# Patient Record
Sex: Male | Born: 1950 | Race: White | Hispanic: No | Smoking: Never smoker
Health system: Southern US, Community
[De-identification: ages and names within clinical notes are randomized; demographics above are authoritative.]

## PROBLEM LIST (undated history)

## (undated) DIAGNOSIS — E785 Hyperlipidemia, unspecified: Secondary | ICD-10-CM

## (undated) DIAGNOSIS — I480 Paroxysmal atrial fibrillation: Secondary | ICD-10-CM

## (undated) HISTORY — DX: Paroxysmal atrial fibrillation: I48.0

## (undated) HISTORY — DX: Hyperlipidemia, unspecified: E78.5

---

## 2003-06-03 ENCOUNTER — Ambulatory Visit (HOSPITAL_COMMUNITY): Admission: RE | Admit: 2003-06-03 | Discharge: 2003-06-03 | Payer: Self-pay | Admitting: Urology

## 2003-06-07 ENCOUNTER — Ambulatory Visit (HOSPITAL_BASED_OUTPATIENT_CLINIC_OR_DEPARTMENT_OTHER): Admission: RE | Admit: 2003-06-07 | Discharge: 2003-06-07 | Payer: Self-pay | Admitting: Urology

## 2007-10-21 ENCOUNTER — Inpatient Hospital Stay (HOSPITAL_COMMUNITY): Admission: RE | Admit: 2007-10-21 | Discharge: 2007-10-23 | Payer: Self-pay | Admitting: Orthopedic Surgery

## 2009-02-17 ENCOUNTER — Emergency Department (HOSPITAL_COMMUNITY): Admission: EM | Admit: 2009-02-17 | Discharge: 2009-02-17 | Payer: Self-pay | Admitting: Emergency Medicine

## 2010-09-30 HISTORY — PX: TOTAL HIP ARTHROPLASTY: SHX124

## 2011-01-08 LAB — DIFFERENTIAL
Basophils Absolute: 0 10*3/uL (ref 0.0–0.1)
Basophils Relative: 1 % (ref 0–1)
Eosinophils Absolute: 0.1 10*3/uL (ref 0.0–0.7)
Eosinophils Relative: 1 % (ref 0–5)
Lymphocytes Relative: 33 % (ref 12–46)
Lymphs Abs: 1.3 10*3/uL (ref 0.7–4.0)
Monocytes Absolute: 0.5 10*3/uL (ref 0.1–1.0)
Monocytes Relative: 11 % (ref 3–12)
Neutro Abs: 2.2 10*3/uL (ref 1.7–7.7)
Neutrophils Relative %: 54 % (ref 43–77)

## 2011-01-08 LAB — COMPREHENSIVE METABOLIC PANEL
ALT: 20 U/L (ref 0–53)
AST: 27 U/L (ref 0–37)
Albumin: 3.7 g/dL (ref 3.5–5.2)
Alkaline Phosphatase: 70 U/L (ref 39–117)
BUN: 19 mg/dL (ref 6–23)
CO2: 27 mEq/L (ref 19–32)
Calcium: 8.8 mg/dL (ref 8.4–10.5)
Chloride: 105 mEq/L (ref 96–112)
Creatinine, Ser: 1.08 mg/dL (ref 0.4–1.5)
GFR calc non Af Amer: >60 mL/min (ref >60)
Glucose, Bld: 123 mg/dL — ABNORMAL HIGH (ref 70–99)
Potassium: 3.9 mEq/L (ref 3.5–5.1)
Sodium: 137 mEq/L (ref 135–145)
Total Bilirubin: 0.6 mg/dL (ref 0.3–1.2)
Total Protein: 6.3 g/dL (ref 6.0–8.3)

## 2011-01-08 LAB — URINALYSIS, ROUTINE W REFLEX MICROSCOPIC
Bilirubin Urine: NEGATIVE
Glucose, UA: NEGATIVE mg/dL
Hgb urine dipstick: NEGATIVE
Ketones, ur: NEGATIVE mg/dL
Nitrite: NEGATIVE
Protein, ur: NEGATIVE mg/dL
Specific Gravity, Urine: 1.03 (ref 1.005–1.030)
Urobilinogen, UA: 1 mg/dL (ref 0.0–1.0)
pH: 6 (ref 5.0–8.0)

## 2011-01-08 LAB — CBC
HCT: 44.8 % (ref 39.0–52.0)
Hemoglobin: 15.5 g/dL (ref 13.0–17.0)
MCHC: 34.5 g/dL (ref 30.0–36.0)
MCV: 88.8 fL (ref 78.0–100.0)
Platelets: 213 10*3/uL (ref 150–400)
RBC: 5.04 MIL/uL (ref 4.22–5.81)
RDW: 13.2 % (ref 11.5–15.5)
WBC: 4.1 10*3/uL (ref 4.0–10.5)

## 2011-01-08 LAB — LIPASE, BLOOD: Lipase: 23 U/L (ref 11–59)

## 2011-02-12 NOTE — Op Note (Signed)
NAMECARSEN, LEAF NO.:  [id number]   MEDICAL RECORD NO.:  [id number]          PATIENT TYPE:  INP   LOCATION:  2899                         FACILITY:  [facility]   PHYSICIAN:  Harvie Junior, M.D.   DATE OF BIRTH:  10/31/1950   DATE OF PROCEDURE:  10/21/2007  DATE OF DISCHARGE:                               OPERATIVE REPORT   PREOPERATIVE DIAGNOSIS:  Degenerative joint disease, right hip.   POSTOPERATIVE DIAGNOSIS:  Degenerative joint disease, right hip.   PROCEDURE:  Right total hip arthroplasty with S-ROM system.  A size  20.15 stem with a 20D large cone, a 36, +12 offset stem, 20 degrees of  anteversion added to the stem, a 60 mm ASR cup with a 54 mm ball with a  +0 neck option.   ASSISTANT:  Marshia Ly, P.A.   ANESTHESIA:  General.   BRIEF HISTORY:  Mr. Dittus is a 60 year old gentleman with a long  history of having had significant right hip pain.  He had been treated  conservatively for a long period time.  Because of continued complaints  of pain and failure of all conservative care, the patient was ultimately  taken to the operating room for right total hip replacement.   PROCEDURE:  The patient was taken to the operating room.  After adequate  anesthesia was obtained with general anesthetic, the patient was placed  on the operating table and moved to the left lateral decubitus.  All  bony prominences were well padded.  An axillary roll was put in place.  Attention was then turned to the right hip which was prepped and draped  in a sterile fashion after  hip positioners were placed.  An incision  was made for posterior approach to the hip.  Skin incision was carried  down to the level of the tensor fascia was divided in line with its  fibers where external rotators, piriformis and posterior capsule were  taken down as a flap in the back and the hip was then dislocated and a  provisional neck cut was made based on putting up a trial stem, engaging  the appropriate length of the neck cut.  Once this was completed,  attention was turned toward the acetabulum which was medialized and then  sequentially reamed to a level of 59.  A 60 trial was then trialed and  did hang up on the rim and a 60 final cup was put in place with 30  degrees of anteversion with about 50 degrees of lateral opening.  Once  this cup was put in place, there was a huge osteophytes posterior and  inferior as well as some anterior and inferior, but mostly posterior and  inferior.  These were debrided with the osteotome and rongeur.  Once  this was completed, attention was turned to the stem which was  sequentially reamed to a level of 15.  A 15.5 was then sent down  halfway.  Lateralizing reamers were used along the way to lateralize the  trochanter.  Once this was completed, cones were put in up to a D cone  and down to a large spout and a trial D large spout was put in place.  A  stem was put in neutral version with a little bit of a tendency  subluxate with a 36, +8 offset.  We went to a 36, +12 and that was  better, but then added 20 degrees of anteversion and had a perfectly  stable hip.   At that point, the trials were then removed.  The finals were put in  place, 18D large cone, a 20.15 stem with 20 degrees of anteversion  dialed, 36, +12 offset and then a +0 ball was trialed once the finals  were put in place.  Excellent stability again and a +0 was chosen for  the final ball.  The final ball was put in place.  The hip relocated and  put through a range of motion with great stability, symmetric leg  lengths and full range of motion.   At this point, the wound was copious and thoroughly irrigated and  suctioned dry.  The short external rotators, piriformis and posterior  capsule were repaired to the intertrochanteric line posteriorly through  drill holes and the tensor fascia was then closed with a 1 Vicryl  running, skin with 0 and 2-0 Vicryl and skin  staples.  Sterile  compressive dressing was applied.   The patient was taken to the recovery room and was noted to be in  satisfactory condition.  Estimated blood loss for the procedure was  about 650 mL. Marshia Ly assisted throughout the case and was  instrumental in its completion.      Harvie Junior, M.D.  Electronically Signed     JLG/MEDQ  D:  10/21/2007  T:  10/21/2007  Job:  [id number]

## 2011-02-15 NOTE — Discharge Summary (Signed)
[name], [name]               ACCOUNT NO.:  [id number]   MEDICAL RECORD NO.:  [id number]          PATIENT TYPE:  INP   LOCATION:  5019                         FACILITY:  [facility]   PHYSICIAN:  Patrick Nelson, M.D.   DATE OF BIRTH:  1951/08/11   DATE OF ADMISSION:  10/21/2007  DATE OF DISCHARGE:  10/23/2007                               DISCHARGE SUMMARY   ADMISSION DIAGNOSES:  1. End-stage degenerative joint disease, right hip.  2. History of renal calculi March 2005.   DISCHARGE DIAGNOSES:  1. End-stage degenerative joint disease, right hip.  2. History of renal calculi March 2005.   PROCEDURES IN HOSPITAL:  Right total hip arthroplasty, Jodi Geralds MD,  October 21, 2007.   BRIEF HISTORY:  Patrick Nelson is a pleasant 60 year old male who has a long  history of progressive right hip pain and limited motion.  X-rays of the  right hip showed end-stage DJD of the right hip.  He had pain with  ambulation and positive night pain that did not improve with exhaustive  conservative treatment including medication, modification of activities  and attempts at rest.  Based on his clinical and radiographic findings,  he was felt to be a candidate for a right total hip arthroplasty and is  admitted for this.   PERTINENT LABORATORY STUDIES:  The patient's postop x-ray of the right  hip showed a right total hip replacement without complicating features.  Hemoglobin on admission was 14.6, hematocrit 42.6.  Indices within  normal limits.  On postop day #1 hemoglobin was 12.6, #2 is 11.7.  Pro  time on admission was 13.4 seconds with an INR of 1.0 and a PTT of 30.  On the date of discharge on Coumadin therapy his pro time was 24.7  seconds with an INR of 2.2.  CMET on admission showed no abnormalities.  On postop day #1 he had a sodium of 134, a glucose of 128.  His EGFR was  normal.  Urinalysis on admission showed no abnormalities.   HOSPITAL COURSE:  The patient underwent a right total hip  arthroplasty  as well-described in Dr. Luiz Nelson' operative note on October 21, 2007.  Preoperatively he was given a gram of Ancef and 80 mg of IV gentamicin.  Postop he was given a gram of Ancef q.8 h. x3 doses.  PCA morphine pump  was used for pain control along with IV fluids and he was started on  Coumadin antithrombotic therapy per pharmacy protocol.  Physical therapy  was ordered for walker ambulation, weightbearing as tolerated on the  right side.  On postop day #1 he had appropriate hip pain.  His vital  signs were stable and he was afebrile, his right lower extremity  neurovascular status was intact.  His hemoglobin was stable, as was his  BMET, and his INR was 1.1.  He was mobilized with physical therapy,  ambulating with a walker, weightbearing as tolerated on the right.  On  postop day #2 he said he was doing fine and he was taking fluids and  voiding without difficulty.  He was progressing with  physical therapy.  His IV was discontinued.  His vital signs were stable and he was  afebrile.  His hg was 11.7, his INR was 2.2.  His right hip dressing was  clean and dry.  His IV was discontinued.  He was discharged home after  being seen by physical therapy.  He is on a regular diet, given a  prescription for Percocet 5 mg p.r.n. pain and Coumadin as directed x1  month postop DVT prophylaxis, and he was weightbearing as tolerated on  the right with a walker, and he will need home health physical therapy  three times a week and a home health RN for pro times and Coumadin  management.  He will follow up with Dr. Luiz Nelson in the office in 2 weeks.  He will use hip precautions.      Patrick Nelson, P.A.      Patrick Nelson, M.D.  Electronically Signed    JB/MEDQ  D:  12/09/2007  T:  12/10/2007  Job:  [id number]   cc:   Georgann Housekeeper, MD

## 2011-02-15 NOTE — Op Note (Signed)
NAMEMARRION, ACCOMANDO NO.:  [id number]   MEDICAL RECORD NO.:  [id number]                   PATIENT TYPE:  AMB   LOCATION:  NESC                                 FACILITY:  [facility]   PHYSICIAN:  Boston Service, M.D.             DATE OF BIRTH:  [birth year]   DATE OF PROCEDURE:  06/07/2003  DATE OF DISCHARGE:                                 OPERATIVE REPORT   PRIMARY CARE PHYSICIAN:  [facility] [employer].   UROLOGIST:  Boston Service, M.D.   PREOPERATIVE DIAGNOSIS:  Hematuria, question of longstanding stone in the  right distal ureter.   INDICATIONS:  The patient is a 60 year old male, stoic.  By his own  admission, the patient has been somewhat intermittent with followup,  presented to the emergency room in [place], [place] in 2002.  Was told he had  a stone in the right distal ureter.  Seen in our office 2003.  CT scan and  KUB consistent with persistent stone in the right distal ureter, then  presented again to our office in August 2004 with 15-50 red cells per HPF,  intermittent right lower quadrant and right CVA tenderness.  By his own  admission, the patient has been preoccupied with care for his wife who is  suffering with early onset Alzheimer's.   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   OPERATION/PROCEDURE:  1. Cystoscopy.  2. Retrograde.  3. Ureteroscopy.  4. Double-J stent placement.   ANESTHESIA:  General.   DRAINS:  A 6-French, 26 cm double-J stent.   DESCRIPTION OF PROCEDURE:  The patient was prepped and draped in the dorsal  lithotomy position after an adequate level of general anesthesia.  Well  lubricated 21-French panendoscope was gently inserted at the urethral  meatus.  Normal urethra and sphincter, nonobstructive prostate.  Small  amount of bloody urine within the bladder.  However, careful inspection of  the urothelium with the 30- and 70-degree  lenses showed no evidence of  tumor, bleeding site or other anatomic abnormality.  There were small  scattered calculi across the surface of the trigone.  Left retrograde was  performed, showed normal course and caliber of the ureter, pelvis and  calyces with prompt drainage at three to five minutes.  Right retrograde was  performed.  The patient has multiple dense phleboliths within the right  hemipelvis.  They appeared to be adjacent to the distal ureter but not  within the distal ureter.  Ureter itself showed no obvious evidence of  filling defect or obstruction.  Due to the longstanding nature of the  patient's complaint and his intermittent followup over the last 18 months,  decision made to proceed with ureteroscopy to rule out any mucosal lesion on  the right side.  Careful inspection of the ureter from the renal pelvis to  the ureterovesical junction using the  long and short 6-French ureteroscope  showed no evidence of stone or urothelial abnormality.  Ureteroscope was withdrawn.  A 6-French, 26 cm double-J stent was placed.  Bladder was drained.  Cystoscope was removed.  The patient was given 30 mg  of Toradol and a B&O suppository.  He was returned to the recovery room in  satisfactory condition.                                                  Boston Service, M.D.    RH/MEDQ  D:  06/07/2003  T:  06/07/2003  Job:  [id number]   cc:   [facility]  [address]

## 2011-06-20 LAB — URINALYSIS, ROUTINE W REFLEX MICROSCOPIC
Bilirubin Urine: NEGATIVE
Glucose, UA: NEGATIVE
Hgb urine dipstick: NEGATIVE
Ketones, ur: NEGATIVE
Nitrite: NEGATIVE
Protein, ur: NEGATIVE
Specific Gravity, Urine: 1.019
Urobilinogen, UA: 1
pH: 6

## 2011-06-20 LAB — COMPREHENSIVE METABOLIC PANEL
ALT: 26
AST: 30
Albumin: 4
Alkaline Phosphatase: 53
BUN: 15
CO2: 28
Calcium: 9.3
Chloride: 102
Creatinine, Ser: 0.93
GFR calc non Af Amer: >60
Glucose, Bld: 105 — ABNORMAL HIGH
Potassium: 4.2
Sodium: 135
Total Bilirubin: 0.7
Total Protein: 6.2

## 2011-06-20 LAB — BASIC METABOLIC PANEL
BUN: 10
CO2: 27
Calcium: 8.6
Chloride: 101
Creatinine, Ser: 0.9
GFR calc non Af Amer: >60
Glucose, Bld: 128 — ABNORMAL HIGH
Potassium: 4.3
Sodium: 134 — ABNORMAL LOW

## 2011-06-20 LAB — CBC
HCT: 33.9 — ABNORMAL LOW
HCT: 36.5 — ABNORMAL LOW
HCT: 42.6
Hemoglobin: 11.7 — ABNORMAL LOW
Hemoglobin: 12.6 — ABNORMAL LOW
Hemoglobin: 14.6
MCHC: 34.4
MCHC: 34.5
MCHC: 34.5
MCV: 89.9
MCV: 90
MCV: 90.1
Platelets: 190
Platelets: 248
Platelets: 253
RBC: 3.77 — ABNORMAL LOW
RBC: 4.05 — ABNORMAL LOW
RBC: 4.73
RDW: 13
RDW: 13.3
RDW: 13.6
WBC: 10.1
WBC: 5.5
WBC: 6.5

## 2011-06-20 LAB — DIFFERENTIAL
Basophils Absolute: 0
Basophils Relative: 1
Eosinophils Absolute: 0.1
Eosinophils Relative: 2
Lymphocytes Relative: 29
Lymphs Abs: 1.6
Monocytes Absolute: 0.5
Monocytes Relative: 9
Neutro Abs: 3.3
Neutrophils Relative %: 60

## 2011-06-20 LAB — PROTIME-INR
INR: 1
INR: 1.1
INR: 2.2 — ABNORMAL HIGH
Prothrombin Time: 13.4
Prothrombin Time: 14.2
Prothrombin Time: 24.7 — ABNORMAL HIGH

## 2011-06-20 LAB — TYPE AND SCREEN
ABO/RH(D): O POS
Antibody Screen: NEGATIVE

## 2011-06-20 LAB — APTT: aPTT: 30

## 2011-06-20 LAB — ABO/RH: ABO/RH(D): O POS

## 2014-01-25 ENCOUNTER — Ambulatory Visit (INDEPENDENT_AMBULATORY_CARE_PROVIDER_SITE_OTHER): Payer: BC Managed Care – PPO | Admitting: Cardiovascular Disease

## 2014-01-25 ENCOUNTER — Encounter: Payer: Self-pay | Admitting: Cardiovascular Disease

## 2014-01-25 VITALS — BP 120/86 | HR 70 | Ht 72.0 in | Wt 171.0 lb

## 2014-01-25 DIAGNOSIS — I4891 Unspecified atrial fibrillation: Secondary | ICD-10-CM

## 2014-01-25 DIAGNOSIS — I480 Paroxysmal atrial fibrillation: Secondary | ICD-10-CM | POA: Insufficient documentation

## 2014-01-25 DIAGNOSIS — E785 Hyperlipidemia, unspecified: Secondary | ICD-10-CM

## 2014-01-25 NOTE — Assessment & Plan Note (Signed)
Was referred because of a singular episode of PAF in the setting of an acute viral illness. He has no cardiac risk factors. He does not have palpitations. He exercises 5 days a week and appears to be in excellent shape at this point I do not see a point in pursuing workup since his CHADS2 to score is low. I will see her back on a when necessary basis.

## 2014-01-25 NOTE — Patient Instructions (Signed)
Your physician recommends that you schedule a follow-up appointment in: As Needed    

## 2014-01-25 NOTE — Assessment & Plan Note (Signed)
On statin therapy followed by his PCP 

## 2014-01-25 NOTE — Progress Notes (Signed)
     01/25/2014 Patrick Nelson   11-04-1950  [id number]  Primary Physician Wenda Low, MD Primary Cardiologist: Lorretta Harp MD Renae Gloss   HPI:  Patrick Nelson is a very pleasant 63 year old widowed Caucasian male father of one child, a grandfather with 3 grandchildren referred by Dr. Wenda Low  at [place] for evaluation of one episode of paroxysmal atrial fibrillation.Patrick Nelson has no cardiovascular risk factors other than hyperlipidemia on statin therapy. He has never had a heart attack or stroke. There is no family history. He denies chest pain or shortness of breath. He had a viral illness possible month ago and was seen in urgent care at which time he was noted to have an irregular heart rhythm and EKG apparently showed atrial fibrillation. This actually spontaneously converted to sinus rhythm and has not recurred.   Current Outpatient Prescriptions  Medication Sig Dispense Refill  . atorvastatin (LIPITOR) 10 MG tablet Take 10 mg by mouth daily.       No current facility-administered medications for this visit.    No Known Allergies  History   Social History  . Marital Status: Widowed    Spouse Name: N/A    Number of Children: N/A  . Years of Education: N/A   Occupational History  . Not on file.   Social History Main Topics  . Smoking status: Never Smoker   . Smokeless tobacco: Not on file  . Alcohol Use: Not on file  . Drug Use: Not on file  . Sexual Activity: Not on file   Other Topics Concern  . Not on file   Social History Narrative  . No narrative on file     Review of Systems: General: negative for chills, fever, night sweats or weight changes.  Cardiovascular: negative for chest pain, dyspnea on exertion, edema, orthopnea, palpitations, paroxysmal nocturnal dyspnea or shortness of breath Dermatological: negative for rash Respiratory: negative for cough or wheezing Urologic: negative for hematuria Abdominal: negative for  nausea, vomiting, diarrhea, bright red blood per rectum, melena, or hematemesis Neurologic: negative for visual changes, syncope, or dizziness All other systems reviewed and are otherwise negative except as noted above.    Blood pressure 120/86, pulse 70, height 6' (1.829 m), weight 171 lb (77.565 kg).  General appearance: alert and no distress Neck: no adenopathy, no carotid bruit, no JVD, supple, symmetrical, trachea midline and thyroid not enlarged, symmetric, no tenderness/mass/nodules Lungs: clear to auscultation bilaterally Heart: regular rate and rhythm, S1, S2 normal, no murmur, click, rub or gallop Abdomen: soft, non-tender; bowel sounds normal; no masses,  no organomegaly Extremities: extremities normal, atraumatic, no cyanosis or edema and plus pedal pulses  EKG normal sinus rhythm at 70 without ST or T wave changes  ASSESSMENT AND PLAN:   Hyperlipidemia On statin therapy followed by his PCP  Paroxysmal atrial fibrillation Was referred because of a singular episode of PAF in the setting of an acute viral illness. He has no cardiac risk factors. He does not have palpitations. He exercises 5 days a week and appears to be in excellent shape at this point I do not see a point in pursuing workup since his CHADS2 to score is low. I will see her back on a when necessary basis.      Lorretta Harp MD FACP,FACC,FAHA, [facility] 01/25/2014 2:51 PM

## 2014-01-28 ENCOUNTER — Encounter: Payer: Self-pay | Admitting: *Deleted

## 2014-05-17 DIAGNOSIS — I781 Nevus, non-neoplastic: Secondary | ICD-10-CM | POA: Diagnosis not present

## 2014-05-17 DIAGNOSIS — L821 Other seborrheic keratosis: Secondary | ICD-10-CM | POA: Diagnosis not present

## 2014-05-17 DIAGNOSIS — L819 Disorder of pigmentation, unspecified: Secondary | ICD-10-CM | POA: Diagnosis not present

## 2014-05-17 DIAGNOSIS — Z85828 Personal history of other malignant neoplasm of skin: Secondary | ICD-10-CM | POA: Diagnosis not present

## 2014-05-17 DIAGNOSIS — D239 Other benign neoplasm of skin, unspecified: Secondary | ICD-10-CM | POA: Diagnosis not present

## 2014-12-14 ENCOUNTER — Encounter (HOSPITAL_COMMUNITY): Payer: Self-pay | Admitting: Nurse Practitioner

## 2014-12-14 ENCOUNTER — Emergency Department (HOSPITAL_COMMUNITY): Payer: Medicare Other

## 2014-12-14 ENCOUNTER — Inpatient Hospital Stay (HOSPITAL_COMMUNITY)
Admission: EM | Admit: 2014-12-14 | Discharge: 2014-12-16 | DRG: 194 | Disposition: A | Discharge status: Home / Self Care | Payer: Medicare Other | Attending: Internal Medicine | Admitting: Internal Medicine

## 2014-12-14 DIAGNOSIS — J189 Pneumonia, unspecified organism: Secondary | ICD-10-CM | POA: Diagnosis present

## 2014-12-14 DIAGNOSIS — E785 Hyperlipidemia, unspecified: Secondary | ICD-10-CM | POA: Diagnosis present

## 2014-12-14 DIAGNOSIS — I10 Essential (primary) hypertension: Secondary | ICD-10-CM | POA: Diagnosis present

## 2014-12-14 DIAGNOSIS — I480 Paroxysmal atrial fibrillation: Secondary | ICD-10-CM | POA: Diagnosis present

## 2014-12-14 DIAGNOSIS — Z96641 Presence of right artificial hip joint: Secondary | ICD-10-CM | POA: Diagnosis present

## 2014-12-14 DIAGNOSIS — I959 Hypotension, unspecified: Secondary | ICD-10-CM | POA: Diagnosis present

## 2014-12-14 DIAGNOSIS — Z79899 Other long term (current) drug therapy: Secondary | ICD-10-CM

## 2014-12-14 DIAGNOSIS — J159 Unspecified bacterial pneumonia: Principal | ICD-10-CM | POA: Diagnosis present

## 2014-12-14 DIAGNOSIS — R55 Syncope and collapse: Secondary | ICD-10-CM | POA: Diagnosis present

## 2014-12-14 DIAGNOSIS — J918 Pleural effusion in other conditions classified elsewhere: Secondary | ICD-10-CM

## 2014-12-14 DIAGNOSIS — Z23 Encounter for immunization: Secondary | ICD-10-CM | POA: Diagnosis not present

## 2014-12-14 DIAGNOSIS — I4891 Unspecified atrial fibrillation: Secondary | ICD-10-CM | POA: Diagnosis not present

## 2014-12-14 DIAGNOSIS — J948 Other specified pleural conditions: Secondary | ICD-10-CM | POA: Diagnosis not present

## 2014-12-14 DIAGNOSIS — J90 Pleural effusion, not elsewhere classified: Secondary | ICD-10-CM | POA: Diagnosis present

## 2014-12-14 DIAGNOSIS — J181 Lobar pneumonia, unspecified organism: Secondary | ICD-10-CM

## 2014-12-14 DIAGNOSIS — R079 Chest pain, unspecified: Secondary | ICD-10-CM | POA: Diagnosis not present

## 2014-12-14 LAB — TROPONIN I: Troponin I: <0.03 ng/mL (ref <0.031)

## 2014-12-14 LAB — CBC WITH DIFFERENTIAL/PLATELET
Basophils Absolute: 0 10*3/uL (ref 0.0–0.1)
Basophils Relative: 0 % (ref 0–1)
Eosinophils Absolute: 0 10*3/uL (ref 0.0–0.7)
Eosinophils Relative: 0 % (ref 0–5)
HCT: 46.8 % (ref 39.0–52.0)
Hemoglobin: 16 g/dL (ref 13.0–17.0)
Lymphocytes Relative: 10 % — ABNORMAL LOW (ref 12–46)
Lymphs Abs: 0.7 10*3/uL (ref 0.7–4.0)
MCH: 30.6 pg (ref 26.0–34.0)
MCHC: 34.2 g/dL (ref 30.0–36.0)
MCV: 89.5 fL (ref 78.0–100.0)
Monocytes Absolute: 0.7 10*3/uL (ref 0.1–1.0)
Monocytes Relative: 11 % (ref 3–12)
Neutro Abs: 5.4 10*3/uL (ref 1.7–7.7)
Neutrophils Relative %: 79 % — ABNORMAL HIGH (ref 43–77)
Platelets: 128 10*3/uL — ABNORMAL LOW (ref 150–400)
RBC: 5.23 MIL/uL (ref 4.22–5.81)
RDW: 13 % (ref 11.5–15.5)
WBC: 6.9 10*3/uL (ref 4.0–10.5)

## 2014-12-14 LAB — URINALYSIS, ROUTINE W REFLEX MICROSCOPIC
Bilirubin Urine: NEGATIVE
Glucose, UA: NEGATIVE mg/dL
Hgb urine dipstick: NEGATIVE
Ketones, ur: 40 mg/dL — AB
Leukocytes, UA: NEGATIVE
Nitrite: NEGATIVE
Protein, ur: NEGATIVE mg/dL
Specific Gravity, Urine: 1.021 (ref 1.005–1.030)
Urobilinogen, UA: 0.2 mg/dL (ref 0.0–1.0)
pH: 6 (ref 5.0–8.0)

## 2014-12-14 LAB — COMPREHENSIVE METABOLIC PANEL
ALT: 48 U/L (ref 0–53)
AST: 63 U/L — ABNORMAL HIGH (ref 0–37)
Albumin: 3.1 g/dL — ABNORMAL LOW (ref 3.5–5.2)
Alkaline Phosphatase: 51 U/L (ref 39–117)
Anion gap: 9 (ref 5–15)
BUN: 15 mg/dL (ref 6–23)
CO2: 26 mmol/L (ref 19–32)
Calcium: 8.1 mg/dL — ABNORMAL LOW (ref 8.4–10.5)
Chloride: 100 mmol/L (ref 96–112)
Creatinine, Ser: 0.97 mg/dL (ref 0.50–1.35)
GFR calc Af Amer: >90 mL/min (ref >90)
GFR calc non Af Amer: 85 mL/min — ABNORMAL LOW (ref >90)
Glucose, Bld: 110 mg/dL — ABNORMAL HIGH (ref 70–99)
Potassium: 4 mmol/L (ref 3.5–5.1)
Sodium: 135 mmol/L (ref 135–145)
Total Bilirubin: 0.8 mg/dL (ref 0.3–1.2)
Total Protein: 5.9 g/dL — ABNORMAL LOW (ref 6.0–8.3)

## 2014-12-14 LAB — MAGNESIUM: Magnesium: 1.8 mg/dL (ref 1.5–2.5)

## 2014-12-14 MED ORDER — DEXTROSE 5 % IV SOLN
1.0000 g | INTRAVENOUS | Status: DC
  Administered 2014-12-15 – 2014-12-16 (×2): 1 g via INTRAVENOUS
  Filled 2014-12-14 (×3): qty 10

## 2014-12-14 MED ORDER — HEPARIN BOLUS VIA INFUSION
4000.0000 [IU] | Freq: Once | INTRAVENOUS | Status: AC
  Administered 2014-12-14: 4000 [IU] via INTRAVENOUS
  Filled 2014-12-14: qty 4000

## 2014-12-14 MED ORDER — AZITHROMYCIN 250 MG PO TABS
500.0000 mg | ORAL_TABLET | ORAL | Status: DC
  Administered 2014-12-15: 500 mg via ORAL
  Filled 2014-12-14 (×2): qty 2

## 2014-12-14 MED ORDER — DEXTROSE 5 % IV SOLN
500.0000 mg | Freq: Once | INTRAVENOUS | Status: AC
  Administered 2014-12-14: 500 mg via INTRAVENOUS
  Filled 2014-12-14: qty 500

## 2014-12-14 MED ORDER — CEFTRIAXONE SODIUM 1 G IJ SOLR
1.0000 g | Freq: Once | INTRAMUSCULAR | Status: AC
  Administered 2014-12-14: 1 g via INTRAVENOUS
  Filled 2014-12-14: qty 10

## 2014-12-14 MED ORDER — INFLUENZA VAC SPLIT QUAD 0.5 ML IM SUSY
0.5000 mL | PREFILLED_SYRINGE | INTRAMUSCULAR | Status: AC
  Administered 2014-12-16: 0.5 mL via INTRAMUSCULAR
  Filled 2014-12-14: qty 0.5

## 2014-12-14 MED ORDER — SODIUM CHLORIDE 0.9 % IV BOLUS (SEPSIS)
1000.0000 mL | Freq: Once | INTRAVENOUS | Status: AC
Start: 2014-12-14 — End: 2014-12-14
  Administered 2014-12-14: 1000 mL via INTRAVENOUS

## 2014-12-14 MED ORDER — DILTIAZEM HCL 30 MG PO TABS
30.0000 mg | ORAL_TABLET | Freq: Four times a day (QID) | ORAL | Status: DC
  Administered 2014-12-14 – 2014-12-16 (×8): 30 mg via ORAL
  Filled 2014-12-14 (×10): qty 1

## 2014-12-14 MED ORDER — HEPARIN (PORCINE) IN NACL 100-0.45 UNIT/ML-% IJ SOLN
1150.0000 [IU]/h | INTRAMUSCULAR | Status: DC
  Administered 2014-12-14: 1150 [IU]/h via INTRAVENOUS
  Filled 2014-12-14: qty 250

## 2014-12-14 NOTE — ED Notes (Signed)
Cards at bedside

## 2014-12-14 NOTE — ED Provider Notes (Signed)
  Physical Exam  BP 104/68 mmHg  Pulse 82  Temp(Src) 97.9 F (36.6 C) (Oral)  Resp 21  Ht 6' (1.829 m)  Wt 175 lb (79.379 kg)  BMI 23.73 kg/m2  SpO2 97%  Physical Exam  ED Course  Procedures  MDM Assumed care of pt from Dr. Vanita Panda.  Pt had syncope, has afib, no recent change.  XR with PNA.  Initially hypotensive, this quickly resolved with fluids.  On assumption of care awaiting labs.    Labs returned.  Admitted in stable condition. Syncope - Plan: DG Chest 2 View, DG Chest 2 View       Debby Freiberg, [place] 12/15/14 [phone]

## 2014-12-14 NOTE — ED Notes (Signed)
Per EMS pt from urgent care to be evaluated for malaise over the past 3 days. Patient denies pain n/v/d. Patient had syncopal episode at urgent care in waiting room. Denies injury. Pressure sitting with ems was initially 90/64- upon ems arrival pt pale. Placed in trendelenburg and given 500cc of NS. Patient alert and oriented x4. NAD

## 2014-12-14 NOTE — ED Notes (Signed)
Attempted to call report X 1.  Cathleen Corti to call back in a few minutes.

## 2014-12-14 NOTE — Progress Notes (Signed)
ANTICOAGULATION CONSULT NOTE - Initial Consult  Pharmacy Consult for Heparin Indication: atrial fibrillation  No Known Allergies  Patient Measurements: Height: 6' (182.9 cm) Weight: 175 lb (79.379 kg) IBW/kg (Calculated) : 77.6 Heparin Dosing Weight: 79 kg  Vital Signs: Temp: 97.9 F (36.6 C) (03/16 1348) Temp Source: Oral (03/16 1348) BP: 110/89 mmHg (03/16 1615) Pulse Rate: 87 (03/16 1615)  Labs:  Recent Labs  12/14/14 1543  HGB 16.0  HCT 46.8  PLT 128*    CrCl cannot be calculated (Patient has no serum creatinine result on file.).   Medical History: Past Medical History  Diagnosis Date  . Paroxysmal atrial fibrillation   . Hyperlipidemia     Medications:   (Not in a hospital admission) Scheduled:  . diltiazem  30 mg Oral 4 times per day   Infusions:  . azithromycin    . cefTRIAXone (ROCEPHIN)  IV 1 g (12/14/14 1615)    Assessment: 64yo male with history of HLD and Afib presents with loss of consciousness. Pharmacy is consulted to dose heparin for atrial fibrillation. CBC is wnl, sCr 0.97, Trop neg x1.  Goal of Therapy:  Heparin level 0.3-0.7 units/ml Monitor platelets by anticoagulation protocol: Yes   Plan:  Give 4000 units bolus x 1 Start heparin infusion at 1150 units/hr Check anti-Xa level in 6 hours and daily while on heparin Continue to monitor H&H and platelets  Andrey Cota. Diona Foley, PharmD Clinical Pharmacist Pager [phone] 12/14/2014,4:24 PM

## 2014-12-14 NOTE — Consult Note (Addendum)
Patient ID: Patrick Nelson MRN: [id number], DOB/AGE: 66-22-1952   Admit date: 12/14/2014   Primary Physician: Wenda Low, MD Primary Cardiologist: Dr. Gwenlyn Found  Pt. Profile:  64 y/o male with history of PAF, not on oral anticoagulation given low CHA2DS2 VASc score, presenting to the ED with atrial fibrillation and syncope.    Problem List  Past Medical History  Diagnosis Date  . Paroxysmal atrial fibrillation   . Hyperlipidemia     Past Surgical History  Procedure Laterality Date  . Total hip arthroplasty  2012    right hip     Allergies  No Known Allergies  HPI The patient is a 64 y/o male with a h/o treated HLD and PAF. He was first diagnosed with atrial fibrillation 12/2013 which occurred during a viral illness, at which time he was seen at an urgent care and EKG showed the arrhthymia. He apparently had spontaneous conversion to NSR. He was referred to Dr. Gwenlyn Found for further evaluation. He was seen in clinic 01/25/14. EKG at that time showed NSR. Per office note, since he had only a single episode in the setting of acute viral illness, low CHA2DS2 VASc score and no cardiac risk factors except for HLD, no further work-up was indicated. Dr. Gwenlyn Found recommended f/u on a PRN basis. He is followed medically by Dr. Lysle Rubens.   He presented to an urgent care facility today with complaints of malaise over the past 3 days. He felt like he had the flu: myalgias, fatigue, weakness, subjective fever and chills, mild productive cough with yellow sputum. No n/v/d. He denies use of Sudafed.  He reports poor PO intake over the last 3 days.   While in the waiting area at the urgent care, he had syncope. His fiance was present at the time and note that he was unresponsive for about 2 min with a blank stare. He was very pale. No seizure like activity. BP was 90/64. He was given a bolus of NS. EKG demonstrated atrial fibrillation. He was subsequently transferred to the ED for further care.   While in  the ED he has had paroxysmal atrial fibrillation, at times with brief RVR. He as been observed to have ventricular rates increase into the 150s, but then quickly return in the 70s-80s. He denies any palpitations, chest discomfort or dyspnea. CXR shows RLL PNA. CBC and BMP pending.    Home Medications  Prior to Admission medications   Medication Sig Start Date End Date Taking? Authorizing Provider  atorvastatin (LIPITOR) 10 MG tablet Take 10 mg by mouth daily.    Historical Provider, MD    Family History  Family History  Problem Relation Age of Onset  . Hypertension Mother   . Heart disease Brother     Social History  History   Social History  . Marital Status: Widowed    Spouse Name: N/A  . Number of Children: N/A  . Years of Education: N/A   Occupational History  . Not on file.   Social History Main Topics  . Smoking status: Never Smoker   . Smokeless tobacco: Not on file  . Alcohol Use: No  . Drug Use: No  . Sexual Activity: Not on file   Other Topics Concern  . Not on file   Social History Narrative     Review of Systems General:  No chills, fever, night sweats or weight changes.  Cardiovascular:  No chest pain, dyspnea on exertion, edema, orthopnea, palpitations, paroxysmal nocturnal dyspnea. Dermatological: No rash,  lesions/masses Respiratory: No cough, dyspnea Urologic: No hematuria, dysuria Abdominal:   No nausea, vomiting, diarrhea, bright red blood per rectum, melena, or hematemesis Neurologic:  No visual changes, wkns, changes in mental status. All other systems reviewed and are otherwise negative except as noted above.  Physical Exam  Blood pressure 110/79, pulse 89, temperature 97.9 F (36.6 C), temperature source Oral, resp. rate 18, height 6' (1.829 m), weight 175 lb (79.379 kg), SpO2 95 %.  General: Pleasant, NAD Psych: Normal affect. Neuro: Alert and oriented X 3. Moves all extremities spontaneously. HEENT: Normal  Neck: Supple without  bruits or JVD. Lungs:  Resp regular and unlabored, rhonchi over RLF Heart: RRR no s3, s4, or murmurs. Abdomen: Soft, non-tender, non-distended, BS + x 4.  Extremities: No clubbing, cyanosis or edema. DP/PT/Radials 2+ and equal bilaterally.  Labs  Troponin (Point of Care Test) No results for input(s): TROPIPOC in the last 72 hours. No results for input(s): CKTOTAL, CKMB, TROPONINI in the last 72 hours. Lab Results  Component Value Date   WBC 4.1 02/17/2009   HGB 15.5 02/17/2009   HCT 44.8 02/17/2009   MCV 88.8 02/17/2009   PLT 213 02/17/2009   No results for input(s): NA, K, CL, CO2, BUN, CREATININE, CALCIUM, PROT, BILITOT, ALKPHOS, ALT, AST, GLUCOSE in the last 168 hours.  Invalid input(s): LABALBU No results found for: CHOL, HDL, LDLCALC, TRIG No results found for: DDIMER   Radiology/Studies  CXR 12/14/14 IMPRESSION: Pneumonia of the right lung base with associated parapneumonic effusion. Follow-up chest imaging may be useful to assure resolution.  There is increased prominence of the left border of the aorta compared to the prior plain films. Tortuosity can be seen the setting of longstanding hypertension, or alternatively, this could represent development of a thoracoabdominal aneurysm. If the patient has no current symptoms attributable to acute aortic syndrome, referral for nonemergent chest CT angiogram may be considered.  Atherosclerosis.  Signed,  Dulcy Fanny. Earleen Newport, DO  [facility]  [facility] ECG/Telemetry  PAF. No ischemic abnormalities on EKG.   ASSESSMENT AND PLAN  1. PAF: Patient appears to be going in and out of atrial fibrillation on telemetry. Ventricular rate is currently stable but occasional spikes in the 130s-150s. Currently asymptomatic. BP is soft but stable in the 956O systolic. CXR shows RLL PNA. Labs pending. Admit to telemetry. Treat underlying acute illness (PNA). Check electrolytes and  repleat if needed. Check TSH. Order 2D echo. Cycle cardiac enzymes. Start IV heparin. He should not require long term anticoagulation given his low CHA2DS2 VASc score of 1 (Age 87-74). If afib continues despite treatment of PNA, he may need ischemic eval. NST vs LHC depending on echo and enzyme trend. Place on Cardizem 30 mg Q6H for rate control, hold for SBP <100.   2. Syncope: in the setting of atrial fibrillation and borderline hypotension. Labs pending. Will check a 2D echo. Check orthostatics.   3. PNA: CXR shows RLL PNA. Recommend admission/ management per Internal Medicine.   4. HLD: continue home statin dose, 10 mg of Lipitor.   5. Hypotension: another liter of IVFs given in ED. Monitor.   Signed, Lyda Jester, PA-C 12/14/2014, 2:24 PM  Patient seen with PA, agree with the above note.  Generally healthy 64 yo with history of PAF presented with probable PNA and was found to have atrial fibrillation.  He had a prior episode of atrial fibrillation also in the setting of a viral illness.  1. Atrial fibrillation: Paroxysmal.  He is in and out of atrial fibrillation today.  Mild RVR.  I suspect this was triggered by acute illness, suspect PNA based on CXR and symptoms.  CHADSVASC = 1 for age.  Last known episode about a year ago.  - Will treat with heparin gtt for now, will not need long-term anticoagulation unless atrial fibrillation becomes persistent and we need to cardiovert.  - Add diltiazem 30 mg every 6 hrs for now, hold if SBP < 100.  As above, he will be getting IV fluid.  - Needs echocardiogram.  2. PNA: Suspected PNA by symptoms and by CXR.  Cannot rule out influenza.   - Treat with abx - Would test for flu.  3. 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   To be admitted by hospitalist, we will follow.  Loralie Champagne 12/14/2014 3:57 PM

## 2014-12-14 NOTE — H&P (Signed)
[facility] History and Physical  Raza Bayless ZJI:[id number] DOB: [birth year] DOA: 12/14/2014  Referring physician: EDP PCP: Wenda Low, MD   Chief Complaint: Syncope   HPI: Patrick Nelson is a 64 y.o. male with h/o A.fib, presents to the ED today with cough and fever.  He had a syncopal episode in PCPs office waiting room, uninjured.  Cough and fever onset yesterday and got worse today.  Had generalized weakness starting 3 days ago.  BP per EMS initially 90/64, rose to 175Z systolic after just 025 cc bolus.  Review of Systems: no: sore throat, sinusitis, muscle or body aches, chest pain, abdominal pain, N/V/D, Systems reviewed.  As above, otherwise negative  Past Medical History  Diagnosis Date  . Paroxysmal atrial fibrillation   . Hyperlipidemia    Past Surgical History  Procedure Laterality Date  . Total hip arthroplasty  2012    right hip   Social History:  reports that he has never smoked. He does not have any smokeless tobacco history on file. He reports that he does not drink alcohol or use illicit drugs.  No Known Allergies  Family History  Problem Relation Age of Onset  . Hypertension Mother   . Heart disease Brother      Prior to Admission medications   Medication Sig Start Date End Date Taking? Authorizing Provider  atorvastatin (LIPITOR) 10 MG tablet Take 10 mg by mouth daily.   Yes Historical Provider, MD  naproxen sodium (ANAPROX) 220 MG tablet Take 220 mg by mouth every 6 (six) hours as needed (pain).   Yes Historical Provider, MD  Phenyleph-Doxylamine-DM-APAP 5-6.25-10-325 MG CAPS Take 1 tablet by mouth at bedtime as needed (cold, congestion).   Yes Historical Provider, MD   Physical Exam: Filed Vitals:   12/14/14 1806  BP: 117/74  Pulse: 53  Temp:   Resp: 19    BP 117/74 mmHg  Pulse 53  Temp(Src) 97.9 F (36.6 C) (Oral)  Resp 19  Ht 6' (1.829 m)  Wt 79.379 kg (175 lb)  BMI 23.73 kg/m2  SpO2 97%  General Appearance:    Alert,  oriented, no distress, appears stated age  Head:    Normocephalic, atraumatic  Eyes:    PERRL, EOMI, sclera non-icteric        Nose:   Nares without drainage or epistaxis. Mucosa, turbinates normal  Throat:   Moist mucous membranes. Oropharynx without erythema or exudate.  Neck:   Supple. No carotid bruits.  No thyromegaly.  No lymphadenopathy.   Back:     No CVA tenderness, no spinal tenderness  Lungs:     Clear to auscultation bilaterally, without wheezes, rhonchi or rales  Chest wall:    No tenderness to palpitation  Heart:    Regular rate and rhythm without murmurs, gallops, rubs  Abdomen:     Soft, non-tender, nondistended, normal bowel sounds, no organomegaly  Genitalia:    deferred  Rectal:    deferred  Extremities:   No clubbing, cyanosis or edema.  Pulses:   2+ and symmetric all extremities  Skin:   Skin color, texture, turgor normal, no rashes or lesions  Lymph nodes:   Cervical, supraclavicular, and axillary nodes normal  Neurologic:   CNII-XII intact. Normal strength, sensation and reflexes      throughout    Labs on Admission:  Basic Metabolic Panel:  Recent Labs Lab 12/14/14 1543  NA 135  K 4.0  CL 100  CO2 26  GLUCOSE 110*  BUN 15  CREATININE  0.97  CALCIUM 8.1*  MG 1.8   Liver Function Tests:  Recent Labs Lab 12/14/14 1543  AST 63*  ALT 48  ALKPHOS 51  BILITOT 0.8  PROT 5.9*  ALBUMIN 3.1*   No results for input(s): LIPASE, AMYLASE in the last 168 hours. No results for input(s): AMMONIA in the last 168 hours. CBC:  Recent Labs Lab 12/14/14 1543  WBC 6.9  NEUTROABS 5.4  HGB 16.0  HCT 46.8  MCV 89.5  PLT 128*   Cardiac Enzymes:  Recent Labs Lab 12/14/14 1543  TROPONINI <0.03    BNP (last 3 results) No results for input(s): PROBNP in the last 8760 hours. CBG: No results for input(s): GLUCAP in the last 168 hours.  Radiological Exams on Admission: Dg Chest 2 View  12/14/2014   CLINICAL DATA:  64 year old male with a history of  syncope.  EXAM: CHEST - 2 VIEW  COMPARISON:  02/17/2009, 10/15/2007  FINDINGS: Cardiac diameter within normal limits. Mild tortuosity descending thoracic aorta. Increased prominence of the left aortic border on the current study.  Atherosclerotic calcifications of the aortic arch.  Airspace opacity of the right base posteriorly on the lateral view. Small pleural effusion. No pneumothorax.  No displaced fracture.  Unremarkable appearance of the upper abdomen.  IMPRESSION: Pneumonia of the right lung base with associated parapneumonic effusion. Follow-up chest imaging may be useful to assure resolution.  There is increased prominence of the left border of the aorta compared to the prior plain films. Tortuosity can be seen the setting of longstanding hypertension, or alternatively, this could represent development of a thoracoabdominal aneurysm. If the patient has no current symptoms attributable to acute aortic syndrome, referral for nonemergent chest CT angiogram may be considered.  Atherosclerosis.  Signed,  Dulcy Fanny. Earleen Newport, DO  [facility]  [facility]   Electronically Signed   By: Corrie Mckusick D.O.   On: 12/14/2014 15:26    EKG: Independently reviewed.  Assessment/Plan Principal Problem:   CAP (community acquired pneumonia) Active Problems:   Syncope   Right lower lobe pneumonia   Parapneumonic effusion   1. CAP - RLL PNA with associated parapneumonic effusion, likely representing a bacterial pneumonia. 1. PNA pathway 2. Rocephin and azithromycin 3. Cultures pending 4. Influenza panel pending but dosent really have ILI symptoms 2. A.Fib with intermittent RVR - 1. Rate controlled with cardizem 2. Continue cardizem per cards 3. On heparin gtt for now, but CHADS-VASc score is only 1 4. Cards on board 3. Syncope - likely related to hypotension and acute CAP illness   Code Status: Full Code  Family Communication: Wife at bedside Disposition  Plan: Admit to inpatient   Time spent: 30 min  Mauriah Mcmillen M. [facility] Pager [phone]  If 7AM-7PM, please contact the day team taking care of the patient [email or web address] Password [facility] 12/14/2014, 8:05 PM

## 2014-12-14 NOTE — ED Notes (Signed)
Patient transported to X-ray 

## 2014-12-14 NOTE — ED Provider Notes (Signed)
CSN: [id number]     Arrival date & time    History   First MD Initiated Contact with Patient 12/14/14 1339     Chief Complaint  Patient presents with  . Loss of Consciousness    HPI  Patient presents after an episode of syncope. Patient states that without clear precipitant about 3 days ago he began to feel generally weak. No palpitations, no nausea, no confusion. Today, with persistent weakness he went to his physician's office. Patient had increasing lightheadedness, had an episode of syncope, without trauma. EMS reports that the patient's blood pressure was initially 90/64. Patient improved with fluids. Here the patient states that he feels weak, but denies focal pain, confusion, disorientation, headache. He states that he was well prior to the onset of weakness several days ago. No new medication, diet, travel. Patient states that he has one prior episode of atrial fibrillation, but takes no medication for arrhythmia. Patient takes only statin.  Past Medical History  Diagnosis Date  . Paroxysmal atrial fibrillation   . Hyperlipidemia    Past Surgical History  Procedure Laterality Date  . Total hip arthroplasty  2012    right hip   Family History  Problem Relation Age of Onset  . Hypertension Mother   . Heart disease Brother    History  Substance Use Topics  . Smoking status: Never Smoker   . Smokeless tobacco: Not on file  . Alcohol Use: No    Review of Systems  Constitutional:       Per HPI, otherwise negative  HENT:       Per HPI, otherwise negative  Respiratory:       Per HPI, otherwise negative  Cardiovascular:       Per HPI, otherwise negative  Gastrointestinal: Negative for vomiting.  Endocrine:       Negative aside from HPI  Genitourinary:       Neg aside from HPI   Musculoskeletal:       Per HPI, otherwise negative  Skin: Negative.   Neurological: Positive for weakness. Negative for syncope.      Allergies  Review of patient's allergies  indicates no known allergies.  Home Medications   Prior to Admission medications   Medication Sig Start Date End Date Taking? Authorizing Provider  atorvastatin (LIPITOR) 10 MG tablet Take 10 mg by mouth daily.    Historical Provider, MD   BP 110/79 mmHg  Pulse 89  Temp(Src) 97.9 F (36.6 C) (Oral)  Resp 18  Ht 6' (1.829 m)  Wt 175 lb (79.379 kg)  BMI 23.73 kg/m2  SpO2 95% Physical Exam  Constitutional: He is oriented to person, place, and time. He appears well-developed. No distress.  HENT:  Head: Normocephalic and atraumatic.  Eyes: Conjunctivae and EOM are normal.  Cardiovascular: An irregularly irregular rhythm present.  Pulmonary/Chest: Effort normal. No stridor. No respiratory distress.  Abdominal: He exhibits no distension.  Musculoskeletal: He exhibits no edema.  Neurological: He is alert and oriented to person, place, and time. No cranial nerve deficit. He exhibits normal muscle tone. Coordination normal.  Skin: Skin is warm and dry.  Psychiatric: He has a normal mood and affect.  Nursing note and vitals reviewed.   ED Course  Procedures (including critical care time) Labs Review Labs Reviewed  CBC WITH DIFFERENTIAL/PLATELET - Abnormal; Notable for the following:    Platelets 128 (*)    Neutrophils Relative % 79 (*)    Lymphocytes Relative 10 (*)    All  other components within normal limits  COMPREHENSIVE METABOLIC PANEL - Abnormal; Notable for the following:    Glucose, Bld 110 (*)    Calcium 8.1 (*)    Total Protein 5.9 (*)    Albumin 3.1 (*)    AST 63 (*)    GFR calc non Af Amer 85 (*)    All other components within normal limits  TROPONIN I  MAGNESIUM  URINALYSIS, ROUTINE W REFLEX MICROSCOPIC  HEPARIN LEVEL (UNFRACTIONATED)  HEPARIN LEVEL (UNFRACTIONATED)  CBC    Imaging Review Dg Chest 2 View  12/14/2014   CLINICAL DATA:  64 year old male with a history of syncope.  EXAM: CHEST - 2 VIEW  COMPARISON:  02/17/2009, 10/15/2007  FINDINGS: Cardiac  diameter within normal limits. Mild tortuosity descending thoracic aorta. Increased prominence of the left aortic border on the current study.  Atherosclerotic calcifications of the aortic arch.  Airspace opacity of the right base posteriorly on the lateral view. Small pleural effusion. No pneumothorax.  No displaced fracture.  Unremarkable appearance of the upper abdomen.  IMPRESSION: Pneumonia of the right lung base with associated parapneumonic effusion. Follow-up chest imaging may be useful to assure resolution.  There is increased prominence of the left border of the aorta compared to the prior plain films. Tortuosity can be seen the setting of longstanding hypertension, or alternatively, this could represent development of a thoracoabdominal aneurysm. If the patient has no current symptoms attributable to acute aortic syndrome, referral for nonemergent chest CT angiogram may be considered.  Atherosclerosis.  Signed,  Dulcy Fanny. Earleen Newport, DO  [facility]  [facility]   Electronically Signed   By: Corrie Mckusick D.O.   On: 12/14/2014 15:26     EKG Interpretation   Date/Time:  Wednesday December 14 2014 14:44:25 EDT Ventricular Rate:  97 PR Interval:    QRS Duration: 69 QT Interval:  348 QTC Calculation: 442 R Axis:   11 Text Interpretation:  Atrial fibrillation RSR' in V1 or V2, probably  normal variant Atrial fibrillation T wave abnormality Abnormal ekg  Confirmed by Carmin Muskrat  MD [phone]) on 12/14/2014 2:46:31 PM     I discussed patient's case with EMS providers upon his arrival. EMS rhythm strip shows atrial fibrillation, rate 85, abnormal  Monitor rate 90s, A. fib, abnormal Pulse oxygen 100% room air normal   3:56 PM Patient in no distress.  I discussed patient's case with our cardiology team. Patient be started on heparin pending possible intervention for his atrial fibrillation.  Patient's x-ray demonstrates pneumonia, which may have  contributed to his onset of atrial fibrillation. Patient was started on antibiotics, admitted to the hospitalist team. With no fever, hypoxia, evidence for sepsis, patient is appropriate for a telemetry bed.   Labs unremarkable MDM   Final diagnoses:  Syncope   atrial fibrillation Community-acquired pneumonia.   Patient with atrial fibrillation presents after an episode of syncope. Patient is awake and alert, and when resting, is in no distress. Patient has had episodes of atrial fibrillation the past, but is currently only taking aspirin for stroke prophylaxis. Patient's evaluation was notable for distraction of pneumonia, but otherwise labs were unremarkable. Patient was started on heparin after discussion with cardiology. Given the patient's syncope, pneumonia, he was admitted to the hospital list team, per cardiology request.   CRITICAL CARE Performed by: Carmin Muskrat Total critical care time: 35 Critical care time was exclusive of separately billable procedures and treating other patients. Critical care was necessary to treat  or prevent imminent or life-threatening deterioration. Critical care was time spent personally by me on the following activities: development of treatment plan with patient and/or surrogate as well as nursing, discussions with consultants, evaluation of patient's response to treatment, examination of patient, obtaining history from patient or surrogate, ordering and performing treatments and interventions, ordering and review of laboratory studies, ordering and review of radiographic studies, pulse oximetry and re-evaluation of patient's condition.     Carmin Muskrat, MD 12/14/14 [phone]

## 2014-12-15 DIAGNOSIS — R55 Syncope and collapse: Secondary | ICD-10-CM

## 2014-12-15 DIAGNOSIS — J948 Other specified pleural conditions: Secondary | ICD-10-CM

## 2014-12-15 DIAGNOSIS — J189 Pneumonia, unspecified organism: Secondary | ICD-10-CM

## 2014-12-15 LAB — TSH: TSH: 2.066 u[IU]/mL (ref 0.350–4.500)

## 2014-12-15 LAB — BASIC METABOLIC PANEL
Anion gap: 6 (ref 5–15)
BUN: 13 mg/dL (ref 6–23)
CO2: 28 mmol/L (ref 19–32)
Calcium: 8.4 mg/dL (ref 8.4–10.5)
Chloride: 101 mmol/L (ref 96–112)
Creatinine, Ser: 0.89 mg/dL (ref 0.50–1.35)
GFR calc Af Amer: >90 mL/min (ref >90)
GFR calc non Af Amer: 88 mL/min — ABNORMAL LOW (ref >90)
Glucose, Bld: 113 mg/dL — ABNORMAL HIGH (ref 70–99)
Potassium: 3.8 mmol/L (ref 3.5–5.1)
Sodium: 135 mmol/L (ref 135–145)

## 2014-12-15 LAB — CBC
HCT: 42.9 % (ref 39.0–52.0)
Hemoglobin: 14.4 g/dL (ref 13.0–17.0)
MCH: 29.9 pg (ref 26.0–34.0)
MCHC: 33.6 g/dL (ref 30.0–36.0)
MCV: 89.2 fL (ref 78.0–100.0)
Platelets: 138 10*3/uL — ABNORMAL LOW (ref 150–400)
RBC: 4.81 MIL/uL (ref 4.22–5.81)
RDW: 12.9 % (ref 11.5–15.5)
WBC: 4.8 10*3/uL (ref 4.0–10.5)

## 2014-12-15 LAB — TROPONIN I: Troponin I: <0.03 ng/mL (ref <0.031)

## 2014-12-15 LAB — STREP PNEUMONIAE URINARY ANTIGEN: Strep Pneumo Urinary Antigen: NEGATIVE

## 2014-12-15 LAB — HIV ANTIBODY (ROUTINE TESTING W REFLEX): HIV Screen 4th Generation wRfx: NONREACTIVE

## 2014-12-15 LAB — HEPARIN LEVEL (UNFRACTIONATED): Heparin Unfractionated: 0.43 IU/mL (ref 0.30–0.70)

## 2014-12-15 MED ORDER — HYDROCODONE-ACETAMINOPHEN 5-325 MG PO TABS
1.0000 | ORAL_TABLET | ORAL | Status: DC | PRN
  Administered 2014-12-15: 1 via ORAL
  Filled 2014-12-15: qty 1

## 2014-12-15 MED ORDER — ACETAMINOPHEN 325 MG PO TABS
650.0000 mg | ORAL_TABLET | Freq: Four times a day (QID) | ORAL | Status: DC | PRN
  Administered 2014-12-15: 650 mg via ORAL
  Filled 2014-12-15: qty 2

## 2014-12-15 NOTE — Progress Notes (Signed)
Patient Name: Patrick Nelson Date of Encounter: 12/15/2014     Principal Problem:   CAP (community acquired pneumonia) Active Problems:   Syncope   Right lower lobe pneumonia   Parapneumonic effusion    SUBJECTIVE  No CP, SOB, dizziness, pre-syncope. He still has a mild cough with yellowish sputum.  CURRENT MEDS . azithromycin  500 mg Oral Q24H  . cefTRIAXone (ROCEPHIN)  IV  1 g Intravenous Q24H  . diltiazem  30 mg Oral 4 times per day  . Influenza vac split quadrivalent PF  0.5 mL Intramuscular Tomorrow-1000    OBJECTIVE  Filed Vitals:   12/14/14 2115 12/15/14 0042 12/15/14 0500 12/15/14 0615  BP: 124/77 107/67 115/69 117/68  Pulse: 57  63   Temp: 98.7 F (37.1 C)  98.8 F (37.1 C)   TempSrc: Oral  Oral   Resp:      Height: 6' (1.829 m)  6' (1.829 m)   Weight: 171 lb 14.4 oz (77.973 kg)  170 lb (77.111 kg)   SpO2: 98%  96%     Intake/Output Summary (Last 24 hours) at 12/15/14 0900 Last data filed at 12/15/14 0542  Gross per 24 hour  Intake    360 ml  Output    525 ml  Net   -165 ml   Filed Weights   12/14/14 1348 12/14/14 2115 12/15/14 0500  Weight: 175 lb (79.379 kg) 171 lb 14.4 oz (77.973 kg) 170 lb (77.111 kg)    PHYSICAL EXAM  General: Pleasant, NAD. Neuro: Alert and oriented X 3. Moves all extremities spontaneously. Psych: Normal affect. HEENT:  Normal  Neck: Supple without bruits or JVD. Lungs:  Resp regular and unlabored, CTA. Heart: irregular ,no s3, s4, or murmurs. Abdomen: Soft, non-tender, non-distended, BS + x 4.  Extremities: No clubbing, cyanosis or edema. DP/PT/Radials 2+ and equal bilaterally.  Accessory Clinical Findings  CBC  Recent Labs  12/14/14 1543 12/15/14 0110  WBC 6.9 4.8  NEUTROABS 5.4  --   HGB 16.0 14.4  HCT 46.8 42.9  MCV 89.5 89.2  PLT 128* 614*   Basic Metabolic Panel  Recent Labs  12/14/14 1543  NA 135  K 4.0  CL 100  CO2 26  GLUCOSE 110*  BUN 15  CREATININE 0.97  CALCIUM 8.1*  MG 1.8    Liver Function Tests  Recent Labs  12/14/14 1543  AST 63*  ALT 48  ALKPHOS 51  BILITOT 0.8  PROT 5.9*  ALBUMIN 3.1*   No results for input(s): LIPASE, AMYLASE in the last 72 hours. Cardiac Enzymes  Recent Labs  12/14/14 1543  TROPONINI <0.03    TELE  NSR with freq PACs  Radiology/Studies  Dg Chest 2 View  12/14/2014   CLINICAL DATA:  64 year old male with a history of syncope.  EXAM: CHEST - 2 VIEW  COMPARISON:  02/17/2009, 10/15/2007  FINDINGS: Cardiac diameter within normal limits. Mild tortuosity descending thoracic aorta. Increased prominence of the left aortic border on the current study.  Atherosclerotic calcifications of the aortic arch.  Airspace opacity of the right base posteriorly on the lateral view. Small pleural effusion. No pneumothorax.  No displaced fracture.  Unremarkable appearance of the upper abdomen.  IMPRESSION: Pneumonia of the right lung base with associated parapneumonic effusion. Follow-up chest imaging may be useful to assure resolution.  There is increased prominence of the left border of the aorta compared to the prior plain films. Tortuosity can be seen the setting of longstanding hypertension, or alternatively, this  could represent development of a thoracoabdominal aneurysm. If the patient has no current symptoms attributable to acute aortic syndrome, referral for nonemergent chest CT angiogram may be considered.  Atherosclerosis.  Signed,  Dulcy Fanny. Earleen Newport, DO  [facility]  [facility]   Electronically Signed   By: Corrie Mckusick D.O.   On: 12/14/2014 15:26    ASSESSMENT AND PLAN  Patrick Nelson is a 64 y.o. male with a history of HLD and PAF who presented to [facility] yesterday with probable PNA and was found to have atrial fibrillation.He also had a syncopal episode. He had a prior episode of atrial fibrillation also in the setting of a viral illness.   1. PAF: He was in and out of atrial fibrillation  yesterday w mild RVR. Now with NSR with freq PACs.   -- Afib yesterday suspected to be triggered by acute illness, suspect PNA based on CXR and symptoms. CHADSVASC = 1 for age. Last known episode about a year ago.  -- Will treat with heparin gtt for now, will not need long-term anticoagulation unless atrial fibrillation becomes persistent and we need to cardiovert.  -- He was placed on diltiazem 30 mg every 6 hrs w/ parameters to hold if SBP < 100. May want to convert this to long acting or discontinue since he is in sinus this AM.  -- TSH and 2D echo pending.   2. Syncope: in the setting of atrial fibrillation and borderline hypotension.  -- 2D ECHO pending  3. PNA: CXR shows RLL PNA.- management per Internal Medicine.   4. HLD: continue home statin dose, 10 mg of Lipitor.   5. Hypotension: much improved after IVF  Signed, Eileen Stanford PA-C  Pager [phone]

## 2014-12-15 NOTE — Care Management Note (Signed)
    Page 1 of 1   12/16/2014     3:01:59 PM CARE MANAGEMENT NOTE 12/16/2014  Patient:  Patrick Nelson,Patrick Nelson   Account Number:  [id number]  Date Initiated:  12/15/2014  Documentation initiated by:  GRAVES-BIGELOW,Cortnie Ringel  Subjective/Objective Assessment:   Pt admitted for syncope.     Action/Plan:   CM to monitor for disposition needs.   Anticipated DC Date:  12/17/2014   Anticipated DC Plan:  [place]  CM consult      Choice offered to / List presented to:             Status of service:  Completed, signed off Medicare Important Message given?  NA - LOS <3 / Initial given by admissions (If response is "NO", the following Medicare IM given date fields will be blank) Date Medicare IM given:   Medicare IM given by:   Date Additional Medicare IM given:   Additional Medicare IM given by:    Discharge Disposition:  HOME/SELF CARE  Per UR Regulation:  Reviewed for med. necessity/level of care/duration of stay  If discussed at [facility] of Stay Meetings, dates discussed:    Comments:

## 2014-12-15 NOTE — Progress Notes (Signed)
[facility] called and said pt had changed rhythm to 1st degree HB from NSR. Paged on call, is aware and no orders received. Will continue to monitor

## 2014-12-15 NOTE — Progress Notes (Signed)
[facility] PROGRESS NOTE   Dearl Rudden DPO:[id number] DOB: [birth year] DOA: 12/14/2014 PCP: Wenda Low, MD  HPI/Subjective: Feels okay, denies any complaints. Slight shortness of breath not much of cough or sputum production  Assessment/Plan: Principal Problem:   CAP (community acquired pneumonia) Active Problems:   Syncope   Right lower lobe pneumonia   Parapneumonic effusion   Community acquired pneumonia RLL pneumonia with associated parapneumonic effusion, likely presented bacterial pneumonia. Started on Rocephin and azithromycin. Blood and sputum cultures pending, negative influenza screen. Supportive management with bronchodilators, mucolytics, antitussives and oxygen as needed.  Atrial fibrillation with intermittent RVR Rate is controlled with Cardizem drip, continued per cardiology. No long-term and decannulation recommended by cardiology because of CHADsVASc score of 1, heparin discontinued. 2-D echo showed normal EF no wall motion abnormalities, trivial pericardial effusion.  Syncope Likely secondary to hypertension from pneumonia. Cannot rule out dizziness from A. fib with RVR, 2-D echo looks okay, no further workup.  Code Status: Full Code Family Communication: Plan discussed with the patient. Disposition Plan: Remains inpatient Diet: Diet Heart  Consultants:  Cardiology  Procedures: None   Antibiotics:  Rocephin and azithromycin   Objective: Filed Vitals:   12/15/14 1125  BP: 119/63  Pulse:   Temp:   Resp:     Intake/Output Summary (Last 24 hours) at 12/15/14 1236 Last data filed at 12/15/14 0542  Gross per 24 hour  Intake    360 ml  Output    525 ml  Net   -165 ml   Filed Weights   12/14/14 1348 12/14/14 2115 12/15/14 0500  Weight: 79.379 kg (175 lb) 77.973 kg (171 lb 14.4 oz) 77.111 kg (170 lb)    Exam: General: Alert and awake, oriented x3, not in any acute distress. HEENT: anicteric sclera, pupils reactive to light  and accommodation, EOMI CVS: S1-S2 clear, no murmur rubs or gallops Chest: clear to auscultation bilaterally, no wheezing, rales or rhonchi Abdomen: soft nontender, nondistended, normal bowel sounds, no organomegaly Extremities: no cyanosis, clubbing or edema noted bilaterally Neuro: Cranial nerves II-XII intact, no focal neurological deficits  Data Reviewed: Basic Metabolic Panel:  Recent Labs Lab 12/14/14 1543  NA 135  K 4.0  CL 100  CO2 26  GLUCOSE 110*  BUN 15  CREATININE 0.97  CALCIUM 8.1*  MG 1.8   Liver Function Tests:  Recent Labs Lab 12/14/14 1543  AST 63*  ALT 48  ALKPHOS 51  BILITOT 0.8  PROT 5.9*  ALBUMIN 3.1*   No results for input(s): LIPASE, AMYLASE in the last 168 hours. No results for input(s): AMMONIA in the last 168 hours. CBC:  Recent Labs Lab 12/14/14 1543 12/15/14 0110  WBC 6.9 4.8  NEUTROABS 5.4  --   HGB 16.0 14.4  HCT 46.8 42.9  MCV 89.5 89.2  PLT 128* 138*   Cardiac Enzymes:  Recent Labs Lab 12/14/14 1543  TROPONINI <0.03   BNP (last 3 results) No results for input(s): BNP in the last 8760 hours.  ProBNP (last 3 results) No results for input(s): PROBNP in the last 8760 hours.  CBG: No results for input(s): GLUCAP in the last 168 hours.  Micro No results found for this or any previous visit (from the past 240 hour(s)).   Studies: Dg Chest 2 View  12/14/2014   CLINICAL DATA:  64 year old male with a history of syncope.  EXAM: CHEST - 2 VIEW  COMPARISON:  02/17/2009, 10/15/2007  FINDINGS: Cardiac diameter within normal limits. Mild tortuosity descending thoracic aorta. Increased  prominence of the left aortic border on the current study.  Atherosclerotic calcifications of the aortic arch.  Airspace opacity of the right base posteriorly on the lateral view. Small pleural effusion. No pneumothorax.  No displaced fracture.  Unremarkable appearance of the upper abdomen.  IMPRESSION: Pneumonia of the right lung base with  associated parapneumonic effusion. Follow-up chest imaging may be useful to assure resolution.  There is increased prominence of the left border of the aorta compared to the prior plain films. Tortuosity can be seen the setting of longstanding hypertension, or alternatively, this could represent development of a thoracoabdominal aneurysm. If the patient has no current symptoms attributable to acute aortic syndrome, referral for nonemergent chest CT angiogram may be considered.  Atherosclerosis.  Signed,  Dulcy Fanny. Earleen Newport, DO  [facility]  [facility]   Electronically Signed   By: Corrie Mckusick D.O.   On: 12/14/2014 15:26    Scheduled Meds: . azithromycin  500 mg Oral Q24H  . cefTRIAXone (ROCEPHIN)  IV  1 g Intravenous Q24H  . diltiazem  30 mg Oral 4 times per day  . Influenza vac split quadrivalent PF  0.5 mL Intramuscular Tomorrow-1000   Continuous Infusions:      Time spent: 35 minutes    [facility] A  [facility] Pager [phone] If 7PM-7AM, please contact night-coverage at [email or web address], password [facility] 12/15/2014, 12:36 PM  LOS: 1 day

## 2014-12-15 NOTE — Progress Notes (Signed)
  Echocardiogram 2D Echocardiogram has been performed.  Diamond Nickel 12/15/2014, 9:54 AM

## 2014-12-15 NOTE — Progress Notes (Signed)
UR Completed Ernesto Lashway Graves-Bigelow, RN,BSN [phone]  

## 2014-12-15 NOTE — Progress Notes (Signed)
ANTICOAGULATION CONSULT NOTE - Follow Up Consult  Pharmacy Consult for heparin Indication: atrial fibrillation  Labs:  Recent Labs  12/14/14 1543 12/15/14 0110  HGB 16.0 14.4  HCT 46.8 42.9  PLT 128* 138*  HEPARINUNFRC  --  0.43  CREATININE 0.97  --   TROPONINI <0.03  --      Assessment/Plan:  64yo male therapeutic on heparin with initial dosing for Afib. Will continue gtt at current rate and confirm stable with additional level.   Wynona Neat, PharmD, BCPS  12/15/2014,1:40 AM

## 2014-12-16 DIAGNOSIS — J159 Unspecified bacterial pneumonia: Secondary | ICD-10-CM | POA: Diagnosis not present

## 2014-12-16 LAB — BASIC METABOLIC PANEL
Anion gap: 5 (ref 5–15)
BUN: 12 mg/dL (ref 6–23)
CO2: 29 mmol/L (ref 19–32)
Calcium: 8.4 mg/dL (ref 8.4–10.5)
Chloride: 102 mmol/L (ref 96–112)
Creatinine, Ser: 0.82 mg/dL (ref 0.50–1.35)
GFR calc Af Amer: >90 mL/min (ref >90)
GFR calc non Af Amer: >90 mL/min (ref >90)
Glucose, Bld: 94 mg/dL (ref 70–99)
Potassium: 3.6 mmol/L (ref 3.5–5.1)
Sodium: 136 mmol/L (ref 135–145)

## 2014-12-16 LAB — LEGIONELLA ANTIGEN, URINE

## 2014-12-16 MED ORDER — LEVOFLOXACIN 750 MG PO TABS: 750.0000 mg | ORAL_TABLET | Freq: Every day | ORAL | Status: DC

## 2014-12-16 NOTE — Discharge Summary (Signed)
Physician Discharge Summary  Nekoda Chock DQQ:[id number] DOB: 03/18/1951 DOA: 12/14/2014  PCP: Wenda Low, MD  Admit date: 12/14/2014 Discharge date: 12/16/2014  Time spent: 40 minutes  Recommendations for Outpatient Follow-up:  1. Follow-up with primary care physician within one week. 2. Patient needs nonemergent contrasted CTA of the chest to rule out descending aortic aneurysm. 3. Outpatient Holter monitoring, follow-up with [place] cardiology.  Discharge Diagnoses:  Principal Problem:   CAP (community acquired pneumonia) Active Problems:   Syncope   Right lower lobe pneumonia   Parapneumonic effusion   Discharge Condition: Stable  Diet recommendation: Heart healthy  Filed Weights   12/14/14 2115 12/15/14 0500 12/16/14 0552  Weight: 77.973 kg (171 lb 14.4 oz) 77.111 kg (170 lb) 78.926 kg (174 lb)    History of present illness:  Adel Burch is a 64 y.o. male with h/o A.fib, presents to the ED today with cough and fever. He had a syncopal episode in PCPs office waiting room, uninjured. Cough and fever onset yesterday and got worse today. Had generalized weakness starting 3 days ago. BP per EMS initially 90/64, rose to 194R systolic after just 740 cc bolus.  Hospital Course:   Community acquired pneumonia RLL pneumonia with associated parapneumonic effusion, likely presented bacterial pneumonia. Started on Rocephin and azithromycin on admission. Blood and sputum cultures pending, negative HIV screen. Patient felt better, off of oxygen, and no much of a cough. Discharged home on levofloxacin for 5 more days.  Atrial fibrillation with intermittent RVR, resolved Presented with atrial fibrillation, placed on Cardizem drip while he was in the ER he was in and out of A. Fib Seen by cardiology and recommended Cardizem drip CHADsVASc score of 1, was on heparin initially, cardiology recommended no long-term anticoagulation. 2-D echo showed normal EF no wall motion  abnormalities, trivial pericardial effusion. Cardiology recommended outpatient Holter monitoring, cardiology to set that up.  Syncope Likely secondary to hypertension from pneumonia and the A. fib with RVR. Denies any dizziness while he is in the hospital, ambulated without help.  Abnormal chest x-ray findings Radiology there is increased prominence of the left border of the descending aorta. This could represent a thoracoabdominal aortic aneurysm, patient needs nonemergent CTA for further evaluation.  Procedures:  None  Consultations:  Cardiology  Discharge Exam: Filed Vitals:   12/16/14 0552  BP: 111/64  Pulse: 48  Temp: 98.2 F (36.8 C)  Resp:    General: Alert and awake, oriented x3, not in any acute distress. HEENT: anicteric sclera, pupils reactive to light and accommodation, EOMI CVS: S1-S2 clear, no murmur rubs or gallops Chest: clear to auscultation bilaterally, no wheezing, rales or rhonchi Abdomen: soft nontender, nondistended, normal bowel sounds, no organomegaly Extremities: no cyanosis, clubbing or edema noted bilaterally Neuro: Cranial nerves II-XII intact, no focal neurological deficits   Discharge Instructions   Discharge Instructions    Diet - low sodium heart healthy    Complete by:  As directed      Increase activity slowly    Complete by:  As directed           Current Discharge Medication List    START taking these medications   Details  levofloxacin (LEVAQUIN) 750 MG tablet Take 1 tablet (750 mg total) by mouth daily. Qty: 5 tablet, Refills: 0      CONTINUE these medications which have NOT CHANGED   Details  atorvastatin (LIPITOR) 10 MG tablet Take 10 mg by mouth daily.    naproxen sodium (ANAPROX) 220 MG tablet  Take 220 mg by mouth every 6 (six) hours as needed (pain).    Phenyleph-Doxylamine-DM-APAP 5-6.25-10-325 MG CAPS Take 1 tablet by mouth at bedtime as needed (cold, congestion).       No Known Allergies Follow-up  Information    Follow up with Wenda Low, MD.   Specialty:  Internal Medicine   Why:  Please follow up wtih your PCP about possible chest x-ray abnormality and follow up Cat Scan of chest.    Contact information:   [address] [place] [address] [phone]        The results of significant diagnostics from this hospitalization (including imaging, microbiology, ancillary and laboratory) are listed below for reference.    Significant Diagnostic Studies: Dg Chest 2 View  12/14/2014   CLINICAL DATA:  64 year old male with a history of syncope.  EXAM: CHEST - 2 VIEW  COMPARISON:  02/17/2009, 10/15/2007  FINDINGS: Cardiac diameter within normal limits. Mild tortuosity descending thoracic aorta. Increased prominence of the left aortic border on the current study.  Atherosclerotic calcifications of the aortic arch.  Airspace opacity of the right base posteriorly on the lateral view. Small pleural effusion. No pneumothorax.  No displaced fracture.  Unremarkable appearance of the upper abdomen.  IMPRESSION: Pneumonia of the right lung base with associated parapneumonic effusion. Follow-up chest imaging may be useful to assure resolution.  There is increased prominence of the left border of the aorta compared to the prior plain films. Tortuosity can be seen the setting of longstanding hypertension, or alternatively, this could represent development of a thoracoabdominal aneurysm. If the patient has no current symptoms attributable to acute aortic syndrome, referral for nonemergent chest CT angiogram may be considered.  Atherosclerosis.  Signed,  Dulcy Fanny. Earleen Newport, DO  [facility]  [facility]   Electronically Signed   By: Corrie Mckusick D.O.   On: 12/14/2014 15:26    Microbiology: Recent Results (from the past 240 hour(s))  Culture, blood (routine x 2) Call MD if unable to obtain prior to antibiotics being given     Status: None  (Preliminary result)   Collection Time: 12/14/14 10:45 PM  Result Value Ref Range Status   Specimen Description BLOOD LEFT AC  Final   Special Requests BOTTLES DRAWN AEROBIC AND ANAEROBIC 10CC  Final   Culture   Final           BLOOD CULTURE RECEIVED NO GROWTH TO DATE CULTURE WILL BE HELD FOR 5 DAYS BEFORE ISSUING A FINAL NEGATIVE REPORT Performed at [facility]    Report Status PENDING  Incomplete  Culture, blood (routine x 2) Call MD if unable to obtain prior to antibiotics being given     Status: None (Preliminary result)   Collection Time: 12/14/14 10:52 PM  Result Value Ref Range Status   Specimen Description BLOOD LEFT FOREARM  Final   Special Requests BOTTLES DRAWN AEROBIC AND ANAEROBIC 10CC  Final   Culture   Final           BLOOD CULTURE RECEIVED NO GROWTH TO DATE CULTURE WILL BE HELD FOR 5 DAYS BEFORE ISSUING A FINAL NEGATIVE REPORT Performed at [facility]    Report Status PENDING  Incomplete     Labs: Basic Metabolic Panel:  Recent Labs Lab 12/14/14 1543 12/15/14 1025 12/16/14 0334  NA 135 135 136  K 4.0 3.8 3.6  CL 100 101 102  CO2 26 28 29   GLUCOSE 110* 113* 94  BUN  15 13 12   CREATININE 0.97 0.89 0.82  CALCIUM 8.1* 8.4 8.4  MG 1.8  --   --    Liver Function Tests:  Recent Labs Lab 12/14/14 1543  AST 63*  ALT 48  ALKPHOS 51  BILITOT 0.8  PROT 5.9*  ALBUMIN 3.1*   No results for input(s): LIPASE, AMYLASE in the last 168 hours. No results for input(s): AMMONIA in the last 168 hours. CBC:  Recent Labs Lab 12/14/14 1543 12/15/14 0110  WBC 6.9 4.8  NEUTROABS 5.4  --   HGB 16.0 14.4  HCT 46.8 42.9  MCV 89.5 89.2  PLT 128* 138*   Cardiac Enzymes:  Recent Labs Lab 12/14/14 1543 12/15/14 1025  TROPONINI <0.03 <0.03   BNP: BNP (last 3 results) No results for input(s): BNP in the last 8760 hours.  ProBNP (last 3 results) No results for input(s): PROBNP in the last 8760 hours.  CBG: No results for input(s): GLUCAP  in the last 168 hours.     Signed:  Yezenia Fredrick A  [facility] 12/16/2014, 12:56 PM

## 2014-12-16 NOTE — Discharge Instructions (Addendum)

## 2014-12-16 NOTE — Progress Notes (Signed)
Patient Name: Patrick Nelson Date of Encounter: 12/16/2014     Principal Problem:   CAP (community acquired pneumonia) Active Problems:   Syncope   Right lower lobe pneumonia   Parapneumonic effusion    SUBJECTIVE  Feeling well. Ready to go home  CURRENT MEDS . azithromycin  500 mg Oral Q24H  . cefTRIAXone (ROCEPHIN)  IV  1 g Intravenous Q24H  . diltiazem  30 mg Oral 4 times per day    OBJECTIVE  Filed Vitals:   12/15/14 1334 12/15/14 1733 12/16/14 0028 12/16/14 0552  BP: 110/67 120/75 116/69 111/64  Pulse: 63   48  Temp: 99.9 F (37.7 C)   98.2 F (36.8 C)  TempSrc: Oral   Oral  Resp: 18     Height:    6' (1.829 m)  Weight:    174 lb (78.926 kg)  SpO2: 93%   96%   No intake or output data in the 24 hours ending 12/16/14 1228 Filed Weights   12/14/14 2115 12/15/14 0500 12/16/14 0552  Weight: 171 lb 14.4 oz (77.973 kg) 170 lb (77.111 kg) 174 lb (78.926 kg)    PHYSICAL EXAM  General: Pleasant, NAD. Neuro: Alert and oriented X 3. Moves all extremities spontaneously. Psych: Normal affect. HEENT:  Normal  Neck: Supple without bruits or JVD. Lungs:  Resp regular and unlabored, CTA. Heart: irregular ,no s3, s4, or murmurs. Abdomen: Soft, non-tender, non-distended, BS + x 4.  Extremities: No clubbing, cyanosis or edema. DP/PT/Radials 2+ and equal bilaterally.  Accessory Clinical Findings  CBC  Recent Labs  12/14/14 1543 12/15/14 0110  WBC 6.9 4.8  NEUTROABS 5.4  --   HGB 16.0 14.4  HCT 46.8 42.9  MCV 89.5 89.2  PLT 128* 725*   Basic Metabolic Panel  Recent Labs  12/14/14 1543 12/15/14 1025 12/16/14 0334  NA 135 135 136  K 4.0 3.8 3.6  CL 100 101 102  CO2 26 28 29   GLUCOSE 110* 113* 94  BUN 15 13 12   CREATININE 0.97 0.89 0.82  CALCIUM 8.1* 8.4 8.4  MG 1.8  --   --    Liver Function Tests  Recent Labs  12/14/14 1543  AST 63*  ALT 48  ALKPHOS 51  BILITOT 0.8  PROT 5.9*  ALBUMIN 3.1*   No results for input(s): LIPASE, AMYLASE  in the last 72 hours. Cardiac Enzymes  Recent Labs  12/14/14 1543 12/15/14 1025  TROPONINI <0.03 <0.03    TELE  NSR with freq PACs  Radiology/Studies  Dg Chest 2 View  12/14/2014   CLINICAL DATA:  64 year old male with a history of syncope.  EXAM: CHEST - 2 VIEW  COMPARISON:  02/17/2009, 10/15/2007  FINDINGS: Cardiac diameter within normal limits. Mild tortuosity descending thoracic aorta. Increased prominence of the left aortic border on the current study.  Atherosclerotic calcifications of the aortic arch.  Airspace opacity of the right base posteriorly on the lateral view. Small pleural effusion. No pneumothorax.  No displaced fracture.  Unremarkable appearance of the upper abdomen.  IMPRESSION: Pneumonia of the right lung base with associated parapneumonic effusion. Follow-up chest imaging may be useful to assure resolution.  There is increased prominence of the left border of the aorta compared to the prior plain films. Tortuosity can be seen the setting of longstanding hypertension, or alternatively, this could represent development of a thoracoabdominal aneurysm. If the patient has no current symptoms attributable to acute aortic syndrome, referral for nonemergent chest CT angiogram may be considered.  Atherosclerosis.  Signed,  Dulcy Fanny. Earleen Newport, DO  [facility]  [facility]   Electronically Signed   By: Corrie Mckusick D.O.   On: 12/14/2014 15:26    2D ECHO  Study Date: 12/15/2014 LV EF: 55% -  60% Study Conclusions - Left ventricle: The cavity size was normal. Wall thickness was normal. Systolic function was normal. The estimated ejection fraction was in the range of 55% to 60%. Wall motion was normal; there were no regional wall motion abnormalities. Left ventricular diastolic function parameters were normal. - Right ventricle: The cavity size was mildly dilated. - Right atrium: The atrium was mildly dilated. - Pericardium,  extracardiac: A trivial pericardial effusion was identified. Impressions: - Normal LV function; mild RAE/RVE; trivial pericardial effusion; trace TR.   ASSESSMENT AND PLAN  Patrick Nelson is a 64 y.o. male with a history of HLD and PAF who presented to [facility] yesterday with probable PNA and was found to have atrial fibrillation.He also had a syncopal episode. He had a prior episode of atrial fibrillation also in the setting of a viral illness.   1. PAF: He was in and out of atrial fibrillation on presentation w mild RVR. Now with NSR with freq PACs.   -- Afib yesterday suspected to be triggered by acute illness, suspect PNA based on CXR and symptoms. CHADSVASC = 1 for age. Last known episode about a year ago.  -- Will treat with heparin gtt for now, will not need long-term anticoagulation unless atrial fibrillation becomes persistent and we need to cardiovert.  -- He was placed on diltiazem 30 mg every 6 hrs. He developed bradycardia on this and it has now been discontinued. Not clear if he needs long term rate control or anticogulation as he has only had two short episodes of AFib both in the setting of an acute URI. Will arrange for a 30 day monitor as an outpatient to exclude asymptomatic atrial fibrillation -- TSH normal 2D echo with no significant abnormality.  -- He can follow with Dr. Gwenlyn Found as an outpatient.  2. Syncope: in the setting of atrial fibrillation and borderline hypotension.  -- 2D ECHO with no significant abnormality.   3. PNA: RLL pneumonia with associated parapneumonic effusion, likely presented bacterial pneumonia. -- Started on Rocephin and azithromycin on admission. -- Blood and sputum cultures pending, negative HIV screen. -- Patient felt better, off of oxygen, and no much of a cough. -- Discharged home on levofloxacin for 5 more days.  4. HLD: continue home statin dose, 10 mg of Lipitor.   5. Hypotension: much improved after IVF  6. 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  -- Patient is aware of this and will follow up with Dr. Deforest Hoyles. I will also send him a copy of this progress note so he is aware of need for follow up    Signed, Eileen Stanford PA-C  Pager [phone]

## 2014-12-16 NOTE — Progress Notes (Signed)
I reviewed d/c instructions with pt and he was discharged in stable condition to private vehicle with wife.

## 2014-12-19 ENCOUNTER — Other Ambulatory Visit: Payer: Self-pay | Admitting: Physician Assistant

## 2014-12-19 DIAGNOSIS — I480 Paroxysmal atrial fibrillation: Secondary | ICD-10-CM

## 2014-12-21 LAB — CULTURE, BLOOD (ROUTINE X 2)
Culture: NO GROWTH
Culture: NO GROWTH

## 2014-12-23 ENCOUNTER — Ambulatory Visit (INDEPENDENT_AMBULATORY_CARE_PROVIDER_SITE_OTHER): Payer: Medicare Other | Admitting: Cardiovascular Disease

## 2014-12-23 ENCOUNTER — Encounter: Payer: Self-pay | Admitting: Cardiovascular Disease

## 2014-12-23 VITALS — BP 118/88 | HR 60 | Ht 72.0 in | Wt 165.0 lb

## 2014-12-23 DIAGNOSIS — I480 Paroxysmal atrial fibrillation: Secondary | ICD-10-CM

## 2014-12-23 DIAGNOSIS — R938 Abnormal findings on diagnostic imaging of other specified body structures: Secondary | ICD-10-CM

## 2014-12-23 DIAGNOSIS — R9389 Abnormal findings on diagnostic imaging of other specified body structures: Secondary | ICD-10-CM

## 2014-12-23 NOTE — Assessment & Plan Note (Signed)
History of PAF in the setting of pneumonia in the past. Apparently had near syncope related to this. He converted back to sinus rhythm. He has normal structural heart. I do not think he needs an event monitor at this time.

## 2014-12-23 NOTE — Progress Notes (Signed)
     12/23/2014 Patrick Nelson   11/19/1950  [id number]  Primary Physician Wenda Low, MD Primary Cardiologist: Lorretta Harp MD Renae Gloss   HPI:   Mr. Keys is a very pleasant 64 year old widowed Caucasian male father of one child, a grandfather with 3 grandchildren referred by Dr. Wenda Low at [place] for evaluation of one episode of paroxysmal atrial fibrillation. I last saw him in the office for/28/15. Mr. Huffaker has no cardiovascular risk factors other than hyperlipidemia on statin therapy. He has never had a heart attack or stroke. There is no family history. He denies chest pain or shortness of breath. He had a viral illness possible month ago and was seen in urgent care at which time he was noted to have an irregular heart rhythm and EKG apparently showed atrial fibrillation. This actually spontaneously converted to sinus rhythm and has not recurred until recently when he was hospitalized with pneumonia and had PAF as well which converted spontaneously. Chest x-ray showed a large left sternal border and a CT abdomen was suggested by radiology to rule out upper echo abdominal aneurysm. He is recovered from his pneumonia and feels clinically well. He is active and exercises on a daily basis.   Current Outpatient Prescriptions  Medication Sig Dispense Refill  . atorvastatin (LIPITOR) 10 MG tablet Take 10 mg by mouth daily.     No current facility-administered medications for this visit.    No Known Allergies  History   Social History  . Marital Status: Widowed    Spouse Name: N/A  . Number of Children: N/A  . Years of Education: N/A   Occupational History  . Not on file.   Social History Main Topics  . Smoking status: Never Smoker   . Smokeless tobacco: Not on file  . Alcohol Use: No  . Drug Use: No  . Sexual Activity: Not on file   Other Topics Concern  . Not on file   Social History Narrative     Review of Systems: General: negative  for chills, fever, night sweats or weight changes.  Cardiovascular: negative for chest pain, dyspnea on exertion, edema, orthopnea, palpitations, paroxysmal nocturnal dyspnea or shortness of breath Dermatological: negative for rash Respiratory: negative for cough or wheezing Urologic: negative for hematuria Abdominal: negative for nausea, vomiting, diarrhea, bright red blood per rectum, melena, or hematemesis Neurologic: negative for visual changes, syncope, or dizziness All other systems reviewed and are otherwise negative except as noted above.    Blood pressure 118/88, pulse 60, height 6' (1.829 m), weight 165 lb (74.844 kg).  General appearance: alert and no distress Neck: no adenopathy, no carotid bruit, no JVD, supple, symmetrical, trachea midline and thyroid not enlarged, symmetric, no tenderness/mass/nodules Lungs: clear to auscultation bilaterally Heart: regular rate and rhythm, S1, S2 normal, no murmur, click, rub or gallop Extremities: extremities normal, atraumatic, no cyanosis or edema  EKG normal sinus rhythm at 60 without ST or T-wave changes. I personally reviewed this EKG  ASSESSMENT AND PLAN:   Paroxysmal atrial fibrillation History of PAF in the setting of pneumonia in the past. Apparently had near syncope related to this. He converted back to sinus rhythm. He has normal structural heart. I do not think he needs an event monitor at this time.   Hyperlipidemia History of hyper-lipidemia on atorvastatin 10 mg followed by his PCP       Lorretta Harp MD [facility], [facility] 12/23/2014 11:59 AM

## 2014-12-23 NOTE — Patient Instructions (Addendum)
Your physician wants you to follow-up in: 1 year with Dr Gwenlyn Found. You will receive a reminder letter in the mail two months in advance. If you don't receive a letter, please call our office to schedule the follow-up appointment.  Dr Gwenlyn Found has ordered a  CT angiogram of your chest to evaluate your aorta.

## 2014-12-23 NOTE — Assessment & Plan Note (Signed)
History of hyper-lipidemia on atorvastatin 10 mg followed by his PCP

## 2014-12-26 ENCOUNTER — Ambulatory Visit
Admission: RE | Admit: 2014-12-26 | Discharge: 2014-12-26 | Disposition: A | Discharge status: Home / Self Care | Payer: Medicare Other | Source: Ambulatory Visit | Attending: Cardiovascular Disease | Admitting: Cardiovascular Disease

## 2014-12-26 DIAGNOSIS — I712 Thoracic aortic aneurysm, without rupture: Secondary | ICD-10-CM | POA: Diagnosis not present

## 2014-12-26 DIAGNOSIS — R9389 Abnormal findings on diagnostic imaging of other specified body structures: Secondary | ICD-10-CM

## 2014-12-26 MED ORDER — IOPAMIDOL (ISOVUE-370) INJECTION 76%
75.0000 mL | Freq: Once | INTRAVENOUS | Status: AC | PRN
  Administered 2014-12-26: 75 mL via INTRAVENOUS

## 2014-12-27 ENCOUNTER — Telehealth: Payer: Self-pay | Admitting: Cardiovascular Disease

## 2014-12-27 ENCOUNTER — Telehealth: Payer: Self-pay | Admitting: *Deleted

## 2014-12-27 DIAGNOSIS — I712 Thoracic aortic aneurysm, without rupture, unspecified: Secondary | ICD-10-CM

## 2014-12-27 DIAGNOSIS — J189 Pneumonia, unspecified organism: Secondary | ICD-10-CM | POA: Diagnosis not present

## 2014-12-27 NOTE — Telephone Encounter (Signed)
lmom 

## 2014-12-27 NOTE — Telephone Encounter (Signed)
-----   Message from Lorretta Harp, MD sent at 12/27/2014  7:33 AM EDT ----- Moderate sized Thoracic AA (4 cm). Recommend annual CTA to follow

## 2014-12-27 NOTE — Telephone Encounter (Signed)
Pt said he talked to you earlier today,he still needs to ask a few questions please.

## 2014-12-27 NOTE — Telephone Encounter (Signed)
Patient notified of results Order placed for repeat CTA in 1 year

## 2015-01-13 ENCOUNTER — Ambulatory Visit
Admission: RE | Admit: 2015-01-13 | Discharge: 2015-01-13 | Disposition: A | Discharge status: Home / Self Care | Payer: Medicare Other | Source: Ambulatory Visit | Attending: Internal Medicine | Admitting: Internal Medicine

## 2015-01-13 ENCOUNTER — Other Ambulatory Visit: Payer: Self-pay | Admitting: Internal Medicine

## 2015-01-13 DIAGNOSIS — Z09 Encounter for follow-up examination after completed treatment for conditions other than malignant neoplasm: Secondary | ICD-10-CM

## 2015-01-13 DIAGNOSIS — J189 Pneumonia, unspecified organism: Secondary | ICD-10-CM | POA: Diagnosis not present

## 2015-02-28 DIAGNOSIS — E780 Pure hypercholesterolemia: Secondary | ICD-10-CM | POA: Diagnosis not present

## 2015-02-28 DIAGNOSIS — I712 Thoracic aortic aneurysm, without rupture: Secondary | ICD-10-CM | POA: Diagnosis not present

## 2015-02-28 DIAGNOSIS — N400 Enlarged prostate without lower urinary tract symptoms: Secondary | ICD-10-CM | POA: Diagnosis not present

## 2015-02-28 DIAGNOSIS — Z1389 Encounter for screening for other disorder: Secondary | ICD-10-CM | POA: Diagnosis not present

## 2015-02-28 DIAGNOSIS — Z23 Encounter for immunization: Secondary | ICD-10-CM | POA: Diagnosis not present

## 2015-02-28 DIAGNOSIS — Z0000 Encounter for general adult medical examination without abnormal findings: Secondary | ICD-10-CM | POA: Diagnosis not present

## 2015-07-20 DIAGNOSIS — D1800 Hemangioma unspecified site: Secondary | ICD-10-CM | POA: Diagnosis not present

## 2015-07-20 DIAGNOSIS — Z85828 Personal history of other malignant neoplasm of skin: Secondary | ICD-10-CM | POA: Diagnosis not present

## 2015-07-20 DIAGNOSIS — L821 Other seborrheic keratosis: Secondary | ICD-10-CM | POA: Diagnosis not present

## 2015-07-20 DIAGNOSIS — D229 Melanocytic nevi, unspecified: Secondary | ICD-10-CM | POA: Diagnosis not present

## 2015-07-20 DIAGNOSIS — L814 Other melanin hyperpigmentation: Secondary | ICD-10-CM | POA: Diagnosis not present

## 2015-11-30 DIAGNOSIS — H11153 Pinguecula, bilateral: Secondary | ICD-10-CM | POA: Diagnosis not present

## 2016-01-09 DIAGNOSIS — H6122 Impacted cerumen, left ear: Secondary | ICD-10-CM | POA: Diagnosis not present

## 2016-01-25 ENCOUNTER — Telehealth: Payer: Self-pay

## 2016-01-25 NOTE — Telephone Encounter (Signed)
-----   Message from Cristopher Estimable, RN sent at 01/01/2016 10:37 AM EDT ----- Regarding: FW: CTA needed   ----- Message -----    From: Chauncy Lean, RN    Sent: 12/13/2015      To: Chauncy Lean, RN Subject: CTA needed                                     Patient needs a repeat CTA of chest to look at TAA

## 2016-01-25 NOTE — Telephone Encounter (Signed)
Pt needs annual CT of the chest to evaluate his TAA.

## 2016-02-29 DIAGNOSIS — M545 Low back pain: Secondary | ICD-10-CM | POA: Diagnosis not present

## 2016-02-29 DIAGNOSIS — M25551 Pain in right hip: Secondary | ICD-10-CM | POA: Diagnosis not present

## 2016-03-01 ENCOUNTER — Other Ambulatory Visit: Payer: Self-pay | Admitting: Internal Medicine

## 2016-03-01 DIAGNOSIS — N400 Enlarged prostate without lower urinary tract symptoms: Secondary | ICD-10-CM | POA: Diagnosis not present

## 2016-03-01 DIAGNOSIS — I712 Thoracic aortic aneurysm, without rupture, unspecified: Secondary | ICD-10-CM

## 2016-03-01 DIAGNOSIS — E7800 Pure hypercholesterolemia, unspecified: Secondary | ICD-10-CM | POA: Diagnosis not present

## 2016-03-01 DIAGNOSIS — Z23 Encounter for immunization: Secondary | ICD-10-CM | POA: Diagnosis not present

## 2016-03-01 DIAGNOSIS — Z1389 Encounter for screening for other disorder: Secondary | ICD-10-CM | POA: Diagnosis not present

## 2016-03-01 DIAGNOSIS — Z0000 Encounter for general adult medical examination without abnormal findings: Secondary | ICD-10-CM | POA: Diagnosis not present

## 2016-03-11 ENCOUNTER — Ambulatory Visit
Admission: RE | Admit: 2016-03-11 | Discharge: 2016-03-11 | Disposition: A | Discharge status: Home / Self Care | Payer: Medicare Other | Source: Ambulatory Visit | Attending: Internal Medicine | Admitting: Internal Medicine

## 2016-03-11 DIAGNOSIS — I712 Thoracic aortic aneurysm, without rupture, unspecified: Secondary | ICD-10-CM

## 2016-03-11 MED ORDER — IOPAMIDOL (ISOVUE-370) INJECTION 76%
75.0000 mL | Freq: Once | INTRAVENOUS | Status: AC | PRN
  Administered 2016-03-11: 75 mL via INTRAVENOUS

## 2016-03-12 DIAGNOSIS — M545 Low back pain: Secondary | ICD-10-CM | POA: Diagnosis not present

## 2016-07-15 DIAGNOSIS — Z23 Encounter for immunization: Secondary | ICD-10-CM | POA: Diagnosis not present

## 2016-07-15 DIAGNOSIS — L814 Other melanin hyperpigmentation: Secondary | ICD-10-CM | POA: Diagnosis not present

## 2016-07-15 DIAGNOSIS — Z85828 Personal history of other malignant neoplasm of skin: Secondary | ICD-10-CM | POA: Diagnosis not present

## 2016-07-15 DIAGNOSIS — D225 Melanocytic nevi of trunk: Secondary | ICD-10-CM | POA: Diagnosis not present

## 2016-07-15 DIAGNOSIS — D1800 Hemangioma unspecified site: Secondary | ICD-10-CM | POA: Diagnosis not present

## 2016-07-15 DIAGNOSIS — L821 Other seborrheic keratosis: Secondary | ICD-10-CM | POA: Diagnosis not present

## 2017-05-05 ENCOUNTER — Other Ambulatory Visit: Payer: Self-pay | Admitting: Internal Medicine

## 2017-05-05 DIAGNOSIS — I712 Thoracic aortic aneurysm, without rupture, unspecified: Secondary | ICD-10-CM

## 2017-05-05 DIAGNOSIS — N200 Calculus of kidney: Secondary | ICD-10-CM | POA: Diagnosis not present

## 2017-05-05 DIAGNOSIS — Z1159 Encounter for screening for other viral diseases: Secondary | ICD-10-CM | POA: Diagnosis not present

## 2017-05-05 DIAGNOSIS — Z1389 Encounter for screening for other disorder: Secondary | ICD-10-CM | POA: Diagnosis not present

## 2017-05-05 DIAGNOSIS — Z0000 Encounter for general adult medical examination without abnormal findings: Secondary | ICD-10-CM | POA: Diagnosis not present

## 2017-05-05 DIAGNOSIS — N400 Enlarged prostate without lower urinary tract symptoms: Secondary | ICD-10-CM | POA: Diagnosis not present

## 2017-05-05 DIAGNOSIS — E7800 Pure hypercholesterolemia, unspecified: Secondary | ICD-10-CM | POA: Diagnosis not present

## 2017-05-08 ENCOUNTER — Ambulatory Visit
Admission: RE | Admit: 2017-05-08 | Discharge: 2017-05-08 | Disposition: A | Discharge status: Home / Self Care | Payer: Medicare Other | Source: Ambulatory Visit | Attending: Internal Medicine | Admitting: Internal Medicine

## 2017-05-08 DIAGNOSIS — I712 Thoracic aortic aneurysm, without rupture, unspecified: Secondary | ICD-10-CM

## 2017-05-08 MED ORDER — IOPAMIDOL (ISOVUE-370) INJECTION 76%
80.0000 mL | Freq: Once | INTRAVENOUS | Status: AC | PRN
  Administered 2017-05-08: 80 mL via INTRAVENOUS

## 2017-07-30 DIAGNOSIS — Z1211 Encounter for screening for malignant neoplasm of colon: Secondary | ICD-10-CM | POA: Diagnosis not present

## 2017-09-01 DIAGNOSIS — Z23 Encounter for immunization: Secondary | ICD-10-CM | POA: Diagnosis not present

## 2017-09-26 DIAGNOSIS — J069 Acute upper respiratory infection, unspecified: Secondary | ICD-10-CM | POA: Diagnosis not present

## 2017-10-08 DIAGNOSIS — R197 Diarrhea, unspecified: Secondary | ICD-10-CM | POA: Diagnosis not present

## 2017-10-08 DIAGNOSIS — A084 Viral intestinal infection, unspecified: Secondary | ICD-10-CM | POA: Diagnosis not present

## 2017-10-09 DIAGNOSIS — J069 Acute upper respiratory infection, unspecified: Secondary | ICD-10-CM | POA: Diagnosis not present

## 2017-10-09 DIAGNOSIS — I951 Orthostatic hypotension: Secondary | ICD-10-CM | POA: Diagnosis not present

## 2017-10-09 DIAGNOSIS — K529 Noninfective gastroenteritis and colitis, unspecified: Secondary | ICD-10-CM | POA: Diagnosis not present

## 2017-10-24 DIAGNOSIS — J069 Acute upper respiratory infection, unspecified: Secondary | ICD-10-CM | POA: Diagnosis not present

## 2017-10-27 DIAGNOSIS — J101 Influenza due to other identified influenza virus with other respiratory manifestations: Secondary | ICD-10-CM | POA: Diagnosis not present

## 2017-10-27 DIAGNOSIS — G43A0 Cyclical vomiting, not intractable: Secondary | ICD-10-CM | POA: Diagnosis not present

## 2017-10-27 DIAGNOSIS — R52 Pain, unspecified: Secondary | ICD-10-CM | POA: Diagnosis not present

## 2017-11-27 ENCOUNTER — Other Ambulatory Visit: Payer: Self-pay | Admitting: Internal Medicine

## 2017-11-27 ENCOUNTER — Ambulatory Visit
Admission: RE | Admit: 2017-11-27 | Discharge: 2017-11-27 | Disposition: A | Discharge status: Home / Self Care | Payer: Medicare Other | Source: Ambulatory Visit | Attending: Internal Medicine | Admitting: Internal Medicine

## 2017-11-27 DIAGNOSIS — R42 Dizziness and giddiness: Secondary | ICD-10-CM

## 2017-11-27 DIAGNOSIS — R112 Nausea with vomiting, unspecified: Secondary | ICD-10-CM | POA: Diagnosis not present

## 2017-12-18 DIAGNOSIS — N503 Cyst of epididymis: Secondary | ICD-10-CM | POA: Diagnosis not present

## 2017-12-18 DIAGNOSIS — N400 Enlarged prostate without lower urinary tract symptoms: Secondary | ICD-10-CM | POA: Diagnosis not present

## 2018-01-27 DIAGNOSIS — L814 Other melanin hyperpigmentation: Secondary | ICD-10-CM | POA: Diagnosis not present

## 2018-01-27 DIAGNOSIS — D485 Neoplasm of uncertain behavior of skin: Secondary | ICD-10-CM | POA: Diagnosis not present

## 2018-01-27 DIAGNOSIS — L821 Other seborrheic keratosis: Secondary | ICD-10-CM | POA: Diagnosis not present

## 2018-01-27 DIAGNOSIS — Z85828 Personal history of other malignant neoplasm of skin: Secondary | ICD-10-CM | POA: Diagnosis not present

## 2018-01-27 DIAGNOSIS — D225 Melanocytic nevi of trunk: Secondary | ICD-10-CM | POA: Diagnosis not present

## 2018-01-27 DIAGNOSIS — D1800 Hemangioma unspecified site: Secondary | ICD-10-CM | POA: Diagnosis not present

## 2018-02-17 DIAGNOSIS — H11441 Conjunctival cysts, right eye: Secondary | ICD-10-CM | POA: Diagnosis not present

## 2018-02-17 DIAGNOSIS — H11153 Pinguecula, bilateral: Secondary | ICD-10-CM | POA: Diagnosis not present

## 2018-02-17 DIAGNOSIS — H43813 Vitreous degeneration, bilateral: Secondary | ICD-10-CM | POA: Diagnosis not present

## 2018-02-17 DIAGNOSIS — H2513 Age-related nuclear cataract, bilateral: Secondary | ICD-10-CM | POA: Diagnosis not present

## 2018-06-08 ENCOUNTER — Other Ambulatory Visit: Payer: Self-pay | Admitting: Internal Medicine

## 2018-06-08 DIAGNOSIS — I712 Thoracic aortic aneurysm, without rupture, unspecified: Secondary | ICD-10-CM

## 2018-06-08 DIAGNOSIS — Z23 Encounter for immunization: Secondary | ICD-10-CM | POA: Diagnosis not present

## 2018-06-08 DIAGNOSIS — N400 Enlarged prostate without lower urinary tract symptoms: Secondary | ICD-10-CM | POA: Diagnosis not present

## 2018-06-08 DIAGNOSIS — R7309 Other abnormal glucose: Secondary | ICD-10-CM | POA: Diagnosis not present

## 2018-06-08 DIAGNOSIS — E7800 Pure hypercholesterolemia, unspecified: Secondary | ICD-10-CM | POA: Diagnosis not present

## 2018-06-08 DIAGNOSIS — Z0000 Encounter for general adult medical examination without abnormal findings: Secondary | ICD-10-CM | POA: Diagnosis not present

## 2018-06-08 DIAGNOSIS — Z1389 Encounter for screening for other disorder: Secondary | ICD-10-CM | POA: Diagnosis not present

## 2018-06-22 ENCOUNTER — Ambulatory Visit
Admission: RE | Admit: 2018-06-22 | Discharge: 2018-06-22 | Disposition: A | Discharge status: Home / Self Care | Payer: Medicare Other | Source: Ambulatory Visit | Attending: Internal Medicine | Admitting: Internal Medicine

## 2018-06-22 DIAGNOSIS — I712 Thoracic aortic aneurysm, without rupture, unspecified: Secondary | ICD-10-CM

## 2018-06-22 MED ORDER — IOPAMIDOL (ISOVUE-370) INJECTION 76%
75.0000 mL | Freq: Once | INTRAVENOUS | Status: AC | PRN
  Administered 2018-06-22: 75 mL via INTRAVENOUS

## 2019-01-05 ENCOUNTER — Other Ambulatory Visit: Payer: Self-pay

## 2019-01-05 ENCOUNTER — Ambulatory Visit (INDEPENDENT_AMBULATORY_CARE_PROVIDER_SITE_OTHER): Payer: Medicare Other | Admitting: Plastic Surgery

## 2019-01-05 ENCOUNTER — Encounter: Payer: Self-pay | Admitting: Plastic Surgery

## 2019-01-05 DIAGNOSIS — L723 Sebaceous cyst: Secondary | ICD-10-CM | POA: Diagnosis not present

## 2019-01-05 DIAGNOSIS — L989 Disorder of the skin and subcutaneous tissue, unspecified: Secondary | ICD-10-CM | POA: Diagnosis not present

## 2019-01-07 ENCOUNTER — Encounter: Payer: Self-pay | Admitting: Plastic Surgery

## 2019-01-07 DIAGNOSIS — L723 Sebaceous cyst: Secondary | ICD-10-CM | POA: Insufficient documentation

## 2019-01-07 DIAGNOSIS — L989 Disorder of the skin and subcutaneous tissue, unspecified: Secondary | ICD-10-CM | POA: Insufficient documentation

## 2019-01-07 NOTE — Progress Notes (Signed)
     Patient ID: Patrick Nelson, male    DOB: [birth year], 68 y.o.   MRN: [id number]   Chief Complaint  Patient presents with  . Skin Problem    The patient is a 68 yrs old wm here for evaluation of a changing skin lesion of his back and face.  He has noticed a raised and tender area on his back.  It comes and goes.  Right now it is not tender and small.  It has been drained in the past.  It is 5 mm in size, not red and no sign of infection.  It is likely a sebaceous cyst.  The 5 mm area on the left cheek as been present for several months.  It is getting larger and is tender.  It bleeds on occasion.  Nothing makes it better. It has an ulcerated center and pearly rim.   Review of Systems  Constitutional: Negative.  Negative for activity change and appetite change.  HENT: Negative.   Eyes: Negative.   Respiratory: Negative.   Cardiovascular: Negative.  Negative for leg swelling.  Gastrointestinal: Negative.   Endocrine: Negative.   Genitourinary: Negative.   Musculoskeletal: Negative.   Skin: Positive for color change. Negative for wound.  Hematological: Negative.   Psychiatric/Behavioral: Negative.     Past Medical History:  Diagnosis Date  . Hyperlipidemia   . Paroxysmal atrial fibrillation [facility])     Past Surgical History:  Procedure Laterality Date  . TOTAL HIP ARTHROPLASTY  2012   right hip      Current Outpatient Medications:  .  atorvastatin (LIPITOR) 10 MG tablet, Take 10 mg by mouth daily., Disp: , Rfl:    Objective:   Vitals:   01/05/19 1431  BP: (!) 155/93  Pulse: 63  Temp: 98.6 F (37 C)  SpO2: 97%    Physical Exam Vitals signs and nursing note reviewed.  Constitutional:      Appearance: Normal appearance.  HENT:     Head: Normocephalic and atraumatic.      Right Ear: External ear normal.     Left Ear: External ear normal.     Mouth/Throat:     Mouth: Mucous membranes are moist.  Eyes:     Extraocular Movements: Extraocular movements intact.   Cardiovascular:     Rate and Rhythm: Normal rate.  Pulmonary:     Effort: Pulmonary effort is normal. No respiratory distress.  Musculoskeletal:       Arms:  Neurological:     General: No focal deficit present.     Mental Status: He is alert and oriented to person, place, and time.  Psychiatric:        Mood and Affect: Mood normal.        Behavior: Behavior normal.     Assessment & Plan:  Changing skin lesion  Sebaceous cyst Recommend excision of changing face skin lesion.  We will monitor back lesion for now.  Will likely need it removed if it gets any larger.  [place], DO

## 2019-06-22 ENCOUNTER — Other Ambulatory Visit: Payer: Self-pay | Admitting: Internal Medicine

## 2019-06-22 DIAGNOSIS — I712 Thoracic aortic aneurysm, without rupture, unspecified: Secondary | ICD-10-CM

## 2019-06-30 ENCOUNTER — Ambulatory Visit
Admission: RE | Admit: 2019-06-30 | Discharge: 2019-06-30 | Disposition: A | Discharge status: Home / Self Care | Payer: Medicare Other | Source: Ambulatory Visit | Attending: Internal Medicine | Admitting: Internal Medicine

## 2019-06-30 DIAGNOSIS — I712 Thoracic aortic aneurysm, without rupture, unspecified: Secondary | ICD-10-CM

## 2019-06-30 MED ORDER — IOPAMIDOL (ISOVUE-370) INJECTION 76%
75.0000 mL | Freq: Once | INTRAVENOUS | Status: AC | PRN
  Administered 2019-06-30: 75 mL via INTRAVENOUS

## 2020-05-09 ENCOUNTER — Other Ambulatory Visit: Payer: Self-pay | Admitting: Internal Medicine

## 2020-05-09 DIAGNOSIS — R221 Localized swelling, mass and lump, neck: Secondary | ICD-10-CM

## 2020-05-11 ENCOUNTER — Other Ambulatory Visit: Payer: Self-pay

## 2020-05-11 ENCOUNTER — Ambulatory Visit
Admission: RE | Admit: 2020-05-11 | Discharge: 2020-05-11 | Disposition: A | Discharge status: Home / Self Care | Payer: Medicare Other | Source: Ambulatory Visit | Attending: Internal Medicine | Admitting: Internal Medicine

## 2020-05-11 DIAGNOSIS — R221 Localized swelling, mass and lump, neck: Secondary | ICD-10-CM

## 2020-05-11 MED ORDER — IOPAMIDOL (ISOVUE-300) INJECTION 61%
75.0000 mL | Freq: Once | INTRAVENOUS | Status: AC | PRN
  Administered 2020-05-11: 75 mL via INTRAVENOUS

## 2020-05-20 ENCOUNTER — Other Ambulatory Visit (HOSPITAL_COMMUNITY)
Admission: RE | Admit: 2020-05-20 | Discharge: 2020-05-20 | Disposition: A | Discharge status: Home / Self Care | Payer: Medicare Other | Source: Ambulatory Visit | Attending: Otolaryngology | Admitting: Otolaryngology

## 2020-05-20 DIAGNOSIS — Z01812 Encounter for preprocedural laboratory examination: Secondary | ICD-10-CM | POA: Diagnosis present

## 2020-05-20 DIAGNOSIS — Z20822 Contact with and (suspected) exposure to covid-19: Secondary | ICD-10-CM | POA: Insufficient documentation

## 2020-05-20 LAB — SARS CORONAVIRUS 2 (TAT 6-24 HRS): SARS Coronavirus 2: NEGATIVE

## 2020-05-22 ENCOUNTER — Other Ambulatory Visit: Payer: Self-pay

## 2020-05-22 ENCOUNTER — Encounter (HOSPITAL_BASED_OUTPATIENT_CLINIC_OR_DEPARTMENT_OTHER)
Admission: RE | Disposition: A | Discharge status: Home / Self Care | Payer: Self-pay | Source: Home / Self Care | Attending: Otolaryngology

## 2020-05-22 ENCOUNTER — Encounter (HOSPITAL_BASED_OUTPATIENT_CLINIC_OR_DEPARTMENT_OTHER): Payer: Self-pay | Admitting: Otolaryngology

## 2020-05-22 ENCOUNTER — Ambulatory Visit (HOSPITAL_BASED_OUTPATIENT_CLINIC_OR_DEPARTMENT_OTHER)
Admission: RE | Admit: 2020-05-22 | Discharge: 2020-05-22 | Disposition: A | Discharge status: Home / Self Care | Payer: Medicare Other | Attending: Otolaryngology | Admitting: Otolaryngology

## 2020-05-22 ENCOUNTER — Ambulatory Visit (HOSPITAL_BASED_OUTPATIENT_CLINIC_OR_DEPARTMENT_OTHER): Payer: Medicare Other | Admitting: Anesthesiology

## 2020-05-22 DIAGNOSIS — C01 Malignant neoplasm of base of tongue: Secondary | ICD-10-CM | POA: Insufficient documentation

## 2020-05-22 DIAGNOSIS — K149 Disease of tongue, unspecified: Secondary | ICD-10-CM | POA: Diagnosis present

## 2020-05-22 DIAGNOSIS — R599 Enlarged lymph nodes, unspecified: Secondary | ICD-10-CM | POA: Insufficient documentation

## 2020-05-22 SURGERY — LARYNGOSCOPY, DIRECT
Anesthesia: General | Site: Mouth

## 2020-05-22 MED ORDER — PROPOFOL 10 MG/ML IV BOLUS
INTRAVENOUS | Status: AC
  Filled 2020-05-22: qty 20

## 2020-05-22 MED ORDER — KETOROLAC TROMETHAMINE 15 MG/ML IJ SOLN: 15.0000 mg | Freq: Once | INTRAMUSCULAR | Status: DC | PRN

## 2020-05-22 MED ORDER — LIDOCAINE 2% (20 MG/ML) 5 ML SYRINGE
INTRAMUSCULAR | Status: AC
  Filled 2020-05-22: qty 5

## 2020-05-22 MED ORDER — DEXAMETHASONE SODIUM PHOSPHATE 4 MG/ML IJ SOLN
INTRAMUSCULAR | Status: DC | PRN
  Administered 2020-05-22: 5 mg via INTRAVENOUS

## 2020-05-22 MED ORDER — ONDANSETRON HCL 4 MG/2ML IJ SOLN
INTRAMUSCULAR | Status: AC
  Filled 2020-05-22: qty 2

## 2020-05-22 MED ORDER — PROPOFOL 10 MG/ML IV BOLUS
INTRAVENOUS | Status: DC | PRN
  Administered 2020-05-22: 150 mg via INTRAVENOUS

## 2020-05-22 MED ORDER — ONDANSETRON HCL 4 MG/2ML IJ SOLN: 4.0000 mg | Freq: Once | INTRAMUSCULAR | Status: DC | PRN

## 2020-05-22 MED ORDER — ONDANSETRON HCL 4 MG/2ML IJ SOLN
INTRAMUSCULAR | Status: DC | PRN
  Administered 2020-05-22: 4 mg via INTRAVENOUS

## 2020-05-22 MED ORDER — FENTANYL CITRATE (PF) 100 MCG/2ML IJ SOLN: 25.0000 ug | INTRAMUSCULAR | Status: DC | PRN

## 2020-05-22 MED ORDER — LACTATED RINGERS IV SOLN: INTRAVENOUS | Status: DC

## 2020-05-22 MED ORDER — LIDOCAINE-EPINEPHRINE 1 %-1:100000 IJ SOLN
INTRAMUSCULAR | Status: AC
  Filled 2020-05-22: qty 1

## 2020-05-22 MED ORDER — ROCURONIUM BROMIDE 100 MG/10ML IV SOLN
INTRAVENOUS | Status: DC | PRN
  Administered 2020-05-22: 30 mg via INTRAVENOUS

## 2020-05-22 MED ORDER — SUGAMMADEX SODIUM 200 MG/2ML IV SOLN
INTRAVENOUS | Status: DC | PRN
  Administered 2020-05-22: 200 mg via INTRAVENOUS

## 2020-05-22 MED ORDER — LIDOCAINE HCL (CARDIAC) PF 100 MG/5ML IV SOSY
PREFILLED_SYRINGE | INTRAVENOUS | Status: DC | PRN
  Administered 2020-05-22: 60 mg via INTRAVENOUS

## 2020-05-22 MED ORDER — EPINEPHRINE PF 1 MG/ML IJ SOLN
INTRAMUSCULAR | Status: DC | PRN
  Administered 2020-05-22: 1 mg

## 2020-05-22 MED ORDER — FENTANYL CITRATE (PF) 100 MCG/2ML IJ SOLN
INTRAMUSCULAR | Status: AC
  Filled 2020-05-22: qty 2

## 2020-05-22 MED ORDER — ACETAMINOPHEN 500 MG PO TABS
1000.0000 mg | ORAL_TABLET | Freq: Once | ORAL | Status: AC
  Administered 2020-05-22: 1000 mg via ORAL

## 2020-05-22 MED ORDER — ACETAMINOPHEN 500 MG PO TABS
ORAL_TABLET | ORAL | Status: AC
  Filled 2020-05-22: qty 2

## 2020-05-22 SURGICAL SUPPLY — 28 items
BNDG EYE OVAL (GAUZE/BANDAGES/DRESSINGS) IMPLANT
CANISTER SUCT 1200ML W/VALVE (MISCELLANEOUS) ×2 IMPLANT
CNTNR URN SCR LID CUP LEK RST (MISCELLANEOUS) IMPLANT
CONT SPEC 4OZ STRL OR WHT (MISCELLANEOUS)
COVER WAND RF STERILE (DRAPES) IMPLANT
GAUZE SPONGE 4X4 12PLY STRL LF (GAUZE/BANDAGES/DRESSINGS) ×4 IMPLANT
GLOVE BIO SURGEON STRL SZ7 (GLOVE) ×2 IMPLANT
GLOVE ECLIPSE 7.5 STRL STRAW (GLOVE) ×2 IMPLANT
GOWN STRL REUS W/ TWL LRG LVL3 (GOWN DISPOSABLE) IMPLANT
GOWN STRL REUS W/ TWL XL LVL3 (GOWN DISPOSABLE) ×2 IMPLANT
GOWN STRL REUS W/TWL LRG LVL3 (GOWN DISPOSABLE)
GOWN STRL REUS W/TWL XL LVL3 (GOWN DISPOSABLE) ×2
GUARD TEETH (MISCELLANEOUS) ×2 IMPLANT
MARKER SKIN DUAL TIP RULER LAB (MISCELLANEOUS) IMPLANT
NEEDLE HYPO 18GX1.5 BLUNT FILL (NEEDLE) ×2 IMPLANT
NEEDLE SPNL 22GX7 QUINCKE BK (NEEDLE) IMPLANT
NEEDLE SPNL 25GX3.5 QUINCKE BL (NEEDLE) IMPLANT
NS IRRIG 1000ML POUR BTL (IV SOLUTION) ×2 IMPLANT
PACK BASIN DAY SURGERY FS (CUSTOM PROCEDURE TRAY) ×2 IMPLANT
PATTIES SURGICAL .5 X3 (DISPOSABLE) ×2 IMPLANT
SHEET MEDIUM DRAPE 40X70 STRL (DRAPES) ×2 IMPLANT
SOLUTION BUTLER CLEAR DIP (MISCELLANEOUS) IMPLANT
SURGILUBE 2OZ TUBE FLIPTOP (MISCELLANEOUS) IMPLANT
SYR 5ML LL (SYRINGE) IMPLANT
SYR CONTROL 10ML LL (SYRINGE) IMPLANT
SYR TB 1ML LL NO SAFETY (SYRINGE) ×2 IMPLANT
TOWEL GREEN STERILE FF (TOWEL DISPOSABLE) ×2 IMPLANT
TUBE CONNECTING 20X1/4 (TUBING) ×2 IMPLANT

## 2020-05-22 NOTE — Anesthesia Procedure Notes (Signed)
Procedure Name: Intubation Date/Time: 05/22/2020 11:10 AM Performed by: Maryella Shivers, CRNA Pre-anesthesia Checklist: Patient identified, Emergency Drugs available, Suction available and Patient being monitored Patient Re-evaluated:Patient Re-evaluated prior to induction Oxygen Delivery Method: Circle system utilized Preoxygenation: Pre-oxygenation with 100% oxygen Induction Type: IV induction Ventilation: Mask ventilation without difficulty Laryngoscope Size: Mac and 4 Grade View: Grade I Tube type: Oral Tube size: 7.0 mm Number of attempts: 1 Airway Equipment and Method: Stylet and Oral airway Placement Confirmation: ETT inserted through vocal cords under direct vision,  positive ETCO2 and breath sounds checked- equal and bilateral Secured at: 22 cm Tube secured with: Tape Dental Injury: Teeth and Oropharynx as per pre-operative assessment

## 2020-05-22 NOTE — Transfer of Care (Signed)
Immediate Anesthesia Transfer of Care Note  Patient: Patrick Nelson  Procedure(s) Performed: DIRECT LARYNGOSCOPY w/BIOPSY TONGUE BASE (N/A Mouth)  Patient Location: PACU  Anesthesia Type:General  Level of Consciousness: sedated  Airway & Oxygen Therapy: Patient Spontanous Breathing and Patient connected to face mask oxygen  Post-op Assessment: Report given to RN and Post -op Vital signs reviewed and stable  Post vital signs: Reviewed and stable  Last Vitals:  Vitals Value Taken Time  BP 132/81 05/22/20 1133  Temp    Pulse 46 05/22/20 1135  Resp 13 05/22/20 1135  SpO2 100 % 05/22/20 1135  Vitals shown include unvalidated device data.  Last Pain:  Vitals:   05/22/20 0932  TempSrc: Oral  PainSc: 0-No pain      Patients Stated Pain Goal: 3 (77/11/65 7903)  Complications: No complications documented.

## 2020-05-22 NOTE — H&P (Signed)
HPI:   Patrick Nelson is a 69 y.o. male who presents as a new Patient.   Referring Provider: Self, A Referral  Chief complaint: Neck mass.  HPI: About 4 5 weeks ago he noticed a lump in the right side of his neck. He has not had any pain or any other symptoms. He denies any sore throat, ear pain, trouble swallowing or breathing. He is not a smoker. He drinks on occasion. Otherwise in good health. He had a CT scan. This reveals multiple enlarged lymph nodes in the right jugular chain and a mass in the right base of tongue.  PMH/Meds/All/SocHx/FamHx/ROS:   History reviewed. No pertinent past medical history.  History reviewed. No pertinent surgical history.  No family history of bleeding disorders, wound healing problems or difficulty with anesthesia.   Social History   Socioeconomic History  . Marital status: Life Partner  Spouse name: Not on file  . Number of children: Not on file  . Years of education: Not on file  . Highest education level: Not on file  Occupational History  . Not on file  Tobacco Use  . Smoking status: Never Smoker  . Smokeless tobacco: Never Used  Substance and Sexual Activity  . Alcohol use: Not on file  . Drug use: Not on file  . Sexual activity: Not on file  Other Topics Concern  . Not on file  Social History Narrative  . Not on file   Social Determinants of Health   Financial Resource Strain:  . Difficulty of Paying Living Expenses:  Food Insecurity:  . Worried About Charity fundraiser in the Last Year:  . Arboriculturist in the Last Year:  Transportation Needs:  . Film/video editor (Medical):  Marland Kitchen Lack of Transportation (Non-Medical):  Physical Activity:  . Days of Exercise per Week:  . Minutes of Exercise per Session:  Stress:  . Feeling of Stress :  Social Connections:  . Frequency of Communication with Friends and Family:  . Frequency of Social Gatherings with Friends and Family:  . Attends Religious Services:  . Active Member  of Clubs or Organizations:  . Attends Archivist Meetings:  Marland Kitchen Marital Status:   Current Outpatient Medications:  . atorvastatin (LIPITOR) 10 MG tablet, Take 10 mg by mouth., Disp: , Rfl:   A complete ROS was performed with pertinent positives/negatives noted in the HPI. The remainder of the ROS are negative.   Physical Exam:   BP 169/79  Pulse 120  Temp 96.8 F (36 C)  Ht 1.829 m (6')  Wt 79.4 kg (175 lb)  BMI 23.73 kg/m   General: Healthy and alert, in no distress, breathing easily. Normal affect. In a pleasant mood. Head: Normocephalic, atraumatic. No masses, or scars. Eyes: Pupils are equal, and reactive to light. Vision is grossly intact. No spontaneous or gaze nystagmus. Ears: Ear canals are clear. Tympanic membranes are intact, with normal landmarks and the middle ears are clear and healthy. Hearing: Grossly normal. Nose: Nasal cavities are clear with healthy mucosa, no polyps or exudate. Airways are patent. Face: No masses or scars, facial nerve function is symmetric. Oral Cavity: No mucosal abnormalities are noted. Tongue with normal mobility. Dentition appears healthy. Oropharynx: Tonsils are sym absent metric. There are no mucosal masses identified. Tongue base reveals asymmetric fullness of the right side. Larynx/Hypopharynx: indirect exam reveals healthy, mobile vocal cords, without mucosal lesions in the hypopharynx or larynx. Chest: Deferred Neck: There are several firm conglomerated lymph  nodes and right level 2/3, no thyroid nodules or enlargement. Neuro: Cranial nerves II-XII with normal function. Balance: Normal gate. Other findings: none.  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 Recommend direct laryngoscopy with biopsy. I would like to map out the mass in the tongue base to see if he is a  candidate for T ORS. After the biopsy if it is positive we will get him in for a PET scan and then get him over to [facility] for evaluation for potential T ORS or over to the cancer center for chemo/XRT.

## 2020-05-22 NOTE — Anesthesia Preprocedure Evaluation (Addendum)
Anesthesia Evaluation  Patient identified by MRN, date of birth, ID band Patient awake    Reviewed: Allergy & Precautions, NPO status , Patient's Chart, lab work & pertinent test results  Airway Mallampati: III  TM Distance: >3 FB Neck ROM: Full    Dental no notable dental hx.    Pulmonary neg pulmonary ROS,    Pulmonary exam normal breath sounds clear to auscultation       Cardiovascular negative cardio ROS Normal cardiovascular exam Rhythm:Regular Rate:Normal     Neuro/Psych negative neurological ROS  negative psych ROS   GI/Hepatic negative GI ROS, Neg liver ROS,   Endo/Other  negative endocrine ROS  Renal/GU negative Renal ROS     Musculoskeletal negative musculoskeletal ROS (+)   Abdominal   Peds  Hematology HLD   Anesthesia Other Findings tongue mass  Reproductive/Obstetrics                            Anesthesia Physical Anesthesia Plan  ASA: I  Anesthesia Plan: General   Post-op Pain Management:    Induction: Intravenous  PONV Risk Score and Plan: 3 and Ondansetron, Dexamethasone, Midazolam and Treatment may vary due to age or medical condition  Airway Management Planned: Oral ETT  Additional Equipment:   Intra-op Plan:   Post-operative Plan: Extubation in OR  Informed Consent: I have reviewed the patients History and Physical, chart, labs and discussed the procedure including the risks, benefits and alternatives for the proposed anesthesia with the patient or authorized representative who has indicated his/her understanding and acceptance.     Dental advisory given  Plan Discussed with: CRNA  Anesthesia Plan Comments:        Anesthesia Quick Evaluation

## 2020-05-22 NOTE — Discharge Instructions (Signed)
Regular diet and activity.   Post Anesthesia Home Care Instructions  Activity: Get plenty of rest for the remainder of the day. A responsible individual must stay with you for 24 hours following the procedure.  For the next 24 hours, DO NOT: -Drive a car -Paediatric nurse -Drink alcoholic beverages -Take any medication unless instructed by your physician -Make any legal decisions or sign important papers.  Meals: Start with liquid foods such as gelatin or soup. Progress to regular foods as tolerated. Avoid greasy, spicy, heavy foods. If nausea and/or vomiting occur, drink only clear liquids until the nausea and/or vomiting subsides. Call your physician if vomiting continues.  Special Instructions/Symptoms: Your throat may feel dry or sore from the anesthesia or the breathing tube placed in your throat during surgery. If this causes discomfort, gargle with warm salt water. The discomfort should disappear within 24 hours.  If you had a scopolamine patch placed behind your ear for the management of post- operative nausea and/or vomiting:  1. The medication in the patch is effective for 72 hours, after which it should be removed.  Wrap patch in a tissue and discard in the trash. Wash hands thoroughly with soap and water. 2. You may remove the patch earlier than 72 hours if you experience unpleasant side effects which may include dry mouth, dizziness or visual disturbances. 3. Avoid touching the patch. Wash your hands with soap and water after contact with the patch.    Call your surgeon if you experience:   1.  Fever over 101.0. 2.  Inability to urinate. 3.  Nausea and/or vomiting. 4.  Extreme swelling or bruising at the surgical site. 5.  Continued bleeding from the incision. 6.  Increased pain, redness or drainage from the incision. 7.  Problems related to your pain medication. 8.  Any problems and/or concerns

## 2020-05-22 NOTE — Anesthesia Postprocedure Evaluation (Signed)
Anesthesia Post Note  Patient: Patrick Nelson  Procedure(s) Performed: DIRECT LARYNGOSCOPY w/BIOPSY TONGUE BASE (N/A Mouth)     Patient location during evaluation: PACU Anesthesia Type: General Level of consciousness: awake and alert Pain management: pain level controlled Vital Signs Assessment: post-procedure vital signs reviewed and stable Respiratory status: spontaneous breathing, nonlabored ventilation, respiratory function stable and patient connected to nasal cannula oxygen Cardiovascular status: blood pressure returned to baseline and stable Postop Assessment: no apparent nausea or vomiting Anesthetic complications: no   No complications documented.  Last Vitals:  Vitals:   05/22/20 1150 05/22/20 1204  BP: 136/83 (!) 146/84  Pulse: (!) 45 (!) 50  Resp: 17 18  Temp:  36.5 C  SpO2: 98% 97%    Last Pain:  Vitals:   05/22/20 1204  TempSrc:   PainSc: 0-No pain                 Sharley Keeler P Rabon Scholle

## 2020-05-22 NOTE — Op Note (Signed)
OPERATIVE REPORT  DATE OF SURGERY: 05/22/2020  PATIENT:  Patrick Nelson,  69 y.o. male  PRE-OPERATIVE DIAGNOSIS:  tongue mass  POST-OPERATIVE DIAGNOSIS:  tongue mass  PROCEDURE:  Procedure(s): DIRECT LARYNGOSCOPY w/BIOPSY  SURGEON:  Beckie Salts, MD  ASSISTANTS: none  ANESTHESIA:   General   EBL: 20 ml  DRAINS: none  LOCAL MEDICATIONS USED:  None  SPECIMEN: Right base of tongue biopsy  COUNTS:  Correct  PROCEDURE DETAILS: The patient was taken to the operating room and placed on the operating table in the supine position. Following induction of general endotracheal anesthesia, the table was turned 90 and the patient was draped in a standard fashion.  The larynx and hypopharynx were inspected.  No lesions were identified.  The oropharynx was inspected.  There was a firm raised lesion along the right lateral tongue base, mostly exophytic.  It was palpable without any deep tongue base component and did not approach the midline.  Multiple biopsies were taken and sent for pathologic evaluation.  Topical adrenaline was applied on pledgets for hemostasis.  No other lesions were identified.  The scope was removed and the patient was awakened extubated transferred to recovery in stable condition.    PATIENT DISPOSITION:  To PACU, stable

## 2020-05-23 ENCOUNTER — Encounter (HOSPITAL_BASED_OUTPATIENT_CLINIC_OR_DEPARTMENT_OTHER): Payer: Self-pay | Admitting: Otolaryngology

## 2020-05-29 ENCOUNTER — Other Ambulatory Visit: Payer: Self-pay | Admitting: Otolaryngology

## 2020-05-29 ENCOUNTER — Other Ambulatory Visit (HOSPITAL_COMMUNITY): Payer: Self-pay | Admitting: Otolaryngology

## 2020-05-29 DIAGNOSIS — C109 Malignant neoplasm of oropharynx, unspecified: Secondary | ICD-10-CM

## 2020-05-31 ENCOUNTER — Telehealth: Payer: Self-pay | Admitting: Hematology and Oncology

## 2020-05-31 NOTE — Telephone Encounter (Signed)
Received a new pt referral from Dr. Constance Holster for mass of tongue. Patrick Nelson has been scheduled to see Dr. Lorenso Courier on 9/13 at 1pm. I cld and lft the appt date and time on the pt's vm. Letter mailed.

## 2020-06-06 ENCOUNTER — Other Ambulatory Visit: Payer: Self-pay

## 2020-06-06 ENCOUNTER — Encounter (HOSPITAL_COMMUNITY)
Admission: RE | Admit: 2020-06-06 | Discharge: 2020-06-06 | Disposition: A | Discharge status: Home / Self Care | Payer: Medicare Other | Source: Ambulatory Visit | Attending: Otolaryngology | Admitting: Otolaryngology

## 2020-06-06 DIAGNOSIS — C109 Malignant neoplasm of oropharynx, unspecified: Secondary | ICD-10-CM | POA: Diagnosis present

## 2020-06-06 DIAGNOSIS — C770 Secondary and unspecified malignant neoplasm of lymph nodes of head, face and neck: Secondary | ICD-10-CM | POA: Diagnosis not present

## 2020-06-06 LAB — GLUCOSE, CAPILLARY: Glucose-Capillary: 89 mg/dL (ref 70–99)

## 2020-06-06 MED ORDER — FLUDEOXYGLUCOSE F - 18 (FDG) INJECTION
9.0100 | Freq: Once | INTRAVENOUS | Status: AC | PRN
  Administered 2020-06-06: 9.01 via INTRAVENOUS

## 2020-06-07 NOTE — Progress Notes (Signed)
Oncology Nurse Navigator Documentation  Placed introductory call to new referral patient Patrick Nelson) Patrick Nelson and significant other Patrick Nelson.  Introduced myself as the H&N oncology nurse navigator that works with Dr. Isidore Moos and Dr. Lorenso Courier to whom he has been referred by Dr. Constance Holster. They confirmed understanding of referral.  Briefly explained my role as his navigator, provided my contact information.   Confirmed understanding of upcoming appts and [place] location, explained arrival and registration process.  I explained the purpose of a dental evaluation prior to starting RT, indicated he would be contacted by [facility] to arrange an appt.  He is currently scheduled to see Dr. Enrique Sack on 05/12/20  I encouraged them to call with questions/concerns as he moves forward with appts and procedures.    They verbalized understanding of information provided, expressed appreciation for my call.   Navigator Initial Assessment . Employment Status: He is employed part time. He is retired.  . Currently on FMLA / STD: na . Living Situation: He lives with his significant other, Patrick Nelson . Support System: Patrick Nelson . PCP: Dr. Deforest Hoyles [facility]) . PCD: Dr. Randol Kern . Financial Concerns: no . Transportation Needs: no . Sensory Deficits:no . Language Barriers/Interpreter Needed:  no . Ambulation Needs: no . DME Used in Home: no . Psychosocial Needs:  no . Concerns/Needs Understanding Cancer:  addressed/answered by navigator to best of ability . Self-Expressed Needs: no   Harlow Asa RN, BSN, OCN Head & Neck Oncology Nurse [place] at [facility] Phone # [phone]  Fax # [phone]

## 2020-06-08 NOTE — Progress Notes (Signed)
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 He has not had any pain or any other symptoms. He denies any sore throat, ear pain, trouble swallowing or breathing. He had a CT scan that revealed multiple enlarged lymph nodes in the right jugular chain and a mass in the right base of tongue  PET Scan 06/06/2020 IMPRESSION: 1. Hypermetabolic right tongue base mass consistent with known neoplasm. Right level 2 and level 3 metastatic adenopathy. 2. No findings for distant metastatic disease involving the chest, abdomen or pelvis. 3. Moderate FDG uptake in the right and left atrial walls most typically seen with atrial fibrillation. Recommend clinical correlation  CT Scan 05/11/2020 IMPRESSION: Suspicion of a 1.5 cm mass at the base of the tongue on the right. Pathologically enlarged right level 2 and level 3 lymph nodes measuring 2.2 and 2.3 cm in size. Indistinct margins raise the possibility of extracapsular spread of tumor.  Biopsies revealed:  05/22/2020 FINAL MICROSCOPIC DIAGNOSIS:  A. TONGUE, RIGHT BASE, BIOPSY:  - Invasive moderately differentiated squamous cell carcinoma, see  comment  Nutrition Status Yes No Comments  Weight changes? []  [x]    Swallowing concerns? []  [x]    PEG? []  [x]     Referrals Yes No Comments  Social Work? [x]  []    Dentistry? [x]  []  Scheduled for consult with Dr. Teena Dunk 06/12/2020  Swallowing therapy? [x]  []    Nutrition? [x]  []    Med/Onc? [x]  []  Scheduled for consult with Dr. Narda Rutherford 06/12/2020   Safety Issues Yes No Comments  Prior radiation? []  [x]    Pacemaker/ICD? []  [x]    Possible current pregnancy? []  [x]    Is the patient on methotrexate? []  [x]     Tobacco/Marijuana/Snuff/ETOH use: Never smoked, does not drink alcohol, and does not use illicit drugs  Past/Anticipated  interventions by otolaryngology, if any: 05/22/2020 Dr. Izora Gala DIRECT LARYNGOSCOPY w/BIOPSY    Past/Anticipated interventions by medical oncology, if any:  Scheduled for consult with Dr. Narda Rutherford 06/12/2020  Current Complaints / other details:  Nothing of note

## 2020-06-09 ENCOUNTER — Other Ambulatory Visit: Payer: Self-pay

## 2020-06-09 ENCOUNTER — Ambulatory Visit
Admission: RE | Admit: 2020-06-09 | Discharge: 2020-06-09 | Disposition: A | Discharge status: Home / Self Care | Payer: Medicare Other | Source: Ambulatory Visit | Attending: Radiation Oncology | Admitting: Radiation Oncology

## 2020-06-09 ENCOUNTER — Encounter: Payer: Self-pay | Admitting: Radiation Oncology

## 2020-06-09 VITALS — BP 167/84 | HR 51 | Resp 17 | Wt 177.2 lb

## 2020-06-09 DIAGNOSIS — Z923 Personal history of irradiation: Secondary | ICD-10-CM | POA: Diagnosis not present

## 2020-06-09 DIAGNOSIS — C01 Malignant neoplasm of base of tongue: Secondary | ICD-10-CM

## 2020-06-09 DIAGNOSIS — Z9221 Personal history of antineoplastic chemotherapy: Secondary | ICD-10-CM | POA: Insufficient documentation

## 2020-06-09 NOTE — Progress Notes (Signed)
Radiation Oncology         [phone]) [phone] ________________________________  Initial outpatient Consultation  Name: Patrick Nelson MRN: [id number]  Date: 06/09/2020  DOB: 02-14-1951  YJ:EHUDJS, Denton Ar, MD  Izora Gala, MD   REFERRING PHYSICIAN: Izora Gala, MD  DIAGNOSIS:    ICD-10-CM   1. Squamous cell carcinoma of base of tongue ([place])  C01 Ambulatory referral to Social Work   Cancer Staging No matching staging information was found for the patient. (Staging is pending p16 status)  CHIEF COMPLAINT: Here to discuss management of base of tongue cancer  HISTORY OF PRESENT ILLNESS::Patrick Nelson is a 69 y.o. male who presented ~2 months ago with symptoms of: a lump in the right side of his neck. He has not had any pain or any other symptoms. He denies any sore throat, ear pain, trouble swallowing or breathing. He had a CT scan that revealed a possible 1.5 cm mass at the right base of tongue and multiple enlarged lymph nodes in the right jugular chain (right level 2 and right level 3 nodes measuring 2.2 x 2.3 cm in size with indistinct margins indicating possible ECE).  On 05/22/2020 the patient underwent right base of tongue biopsy which revealed invasive moderately differentiated squamous cell carcinoma. p16 is has not yet been ordered [facility] RN, our navigator, will facilitate that).  PET Scan was performed on 06/06/2020 revealing: IMPRESSION:  Hypermetabolic right tongue base mass consistent with known neoplasm. Right level 2 and level 3 metastatic adenopathy. No findings for distant metastatic disease involving the chest, abdomen or pelvis.     Nutrition Status Yes No Comments  Weight changes? []  [x]    Swallowing concerns? []  [x]    PEG? []  [x]     Referrals Yes No Comments  Social Work? [x]  []    Dentistry? [x]  []  Scheduled for consult with Dr. Teena Dunk 06/12/2020  Swallowing therapy? [x]  []    Nutrition? [x]  []    Med/Onc? [x]  []  Scheduled for consult with Dr.  Narda Rutherford 06/12/2020   Safety Issues Yes No Comments  Prior radiation? []  [x]    Pacemaker/ICD? []  [x]    Possible current pregnancy? []  [x]    Is the patient on methotrexate? []  [x]     Tobacco/Marijuana/Snuff/ETOH use: Never smoked, does not drink alcohol, and does not use illicit drugs  Past/Anticipated interventions by otolaryngology, if any: 05/22/2020 Dr. Izora Gala DIRECT LARYNGOSCOPY w/BIOPSY    Past/Anticipated interventions by medical oncology, if any:  Scheduled for consult with Dr. Narda Rutherford 06/12/2020  Current Complaints / other details: Today I spoke with Dr. Constance Holster personally about the patient's case.  He has conferred with his colleagues at [facility] and the patient is NOT considered to be be a good candidate for transoral robotic surgery.    PREVIOUS RADIATION THERAPY: No  PAST MEDICAL HISTORY:  has a past medical history of Hyperlipidemia and Paroxysmal atrial fibrillation ([place]).    PAST SURGICAL HISTORY: Past Surgical History:  Procedure Laterality Date  . DIRECT LARYNGOSCOPY N/A 05/22/2020   Procedure: DIRECT LARYNGOSCOPY w/BIOPSY TONGUE BASE;  Surgeon: Izora Gala, MD;  Location: [place];  Service: ENT;  Laterality: N/A;  . TOTAL HIP ARTHROPLASTY  2012   right hip    FAMILY HISTORY: family history includes Heart disease in his brother; Hypertension in his mother.  SOCIAL HISTORY:  reports that he has never smoked. He has never used smokeless tobacco. He reports that he does not drink alcohol and does not use drugs.  ALLERGIES: Patient has no known  allergies.  MEDICATIONS:  Current Outpatient Medications  Medication Sig Dispense Refill  . atorvastatin (LIPITOR) 10 MG tablet Take 10 mg by mouth daily.    . Cholecalciferol (VITAMIN D3 SUPER STRENGTH) 50 MCG (2000 UT) TABS Take 2,000 Units by mouth.    . cyanocobalamin 1000 MCG tablet Take 1,000 mcg by mouth daily.    . folic acid (FOLVITE) 073 MCG tablet Take 400 mcg by mouth  daily.    . melatonin 5 MG TABS Take 5 mg by mouth at bedtime.    . Misc Natural Products (CHROMIUM PICOLINATE FORTIFIED PO) Take by mouth.    . Nutritional Supplements (ANTI-INFLAMMATORY ENZYME) CAPS Take by mouth. Recover AI     No current facility-administered medications for this encounter.    REVIEW OF SYSTEMS:  Notable for that above.   PHYSICAL EXAM:  weight is 177 lb 4 oz (80.4 kg). His blood pressure is 167/84 (abnormal) and his pulse is 51 (abnormal). His respiration is 17 and oxygen saturation is 100%.   General: Alert and oriented, in no acute distress HEENT: Head is normocephalic. Extraocular movements are intact. Oropharynx is notable for mildly asymmetric elevation of palate (right soft palate elevates less than left).  Teeth are in suboptimal condition. Neck: Neck is notable for fullness along SCM muscle, right neck. Heart: Distant heart sounds  Chest: Clear to auscultation bilaterally, with no rhonchi, wheezes, or rales. Abdomen: Soft, nontender, nondistended, with no rigidity or guarding. Extremities: No cyanosis or edema. Lymphatics: see Neck Exam Skin: No concerning lesions. Musculoskeletal: symmetric strength and muscle tone throughout. Neurologic: Cranial nerves II through XII are grossly intact. No obvious focalities. Speech is fluent. Coordination is intact. Psychiatric: Judgment and insight are intact. Affect is appropriate.   ECOG = 0  0 - Asymptomatic (Fully active, able to carry on all predisease activities without restriction)  1 - Symptomatic but completely ambulatory (Restricted in physically strenuous activity but ambulatory and able to carry out work of a light or sedentary nature. For example, light housework, office work)  2 - Symptomatic, <50% in bed during the day (Ambulatory and capable of all self care but unable to carry out any work activities. Up and about more than 50% of waking hours)  3 - Symptomatic, >50% in bed, but not bedbound (Capable  of only limited self-care, confined to bed or chair 50% or more of waking hours)  4 - Bedbound (Completely disabled. Cannot carry on any self-care. Totally confined to bed or chair)  5 - Death   Eustace Pen MM, Creech RH, Tormey DC, et al. [phone]). "Toxicity and response criteria of the [facility]". [place] Oncol. 5 (6): 649-55   LABORATORY DATA:  Lab Results  Component Value Date   WBC 4.8 12/15/2014   HGB 14.4 12/15/2014   HCT 42.9 12/15/2014   MCV 89.2 12/15/2014   PLT 138 (L) 12/15/2014   CMP     Component Value Date/Time   NA 136 12/16/2014 0334   K 3.6 12/16/2014 0334   CL 102 12/16/2014 0334   CO2 29 12/16/2014 0334   GLUCOSE 94 12/16/2014 0334   BUN 12 12/16/2014 0334   CREATININE 0.82 12/16/2014 0334   CALCIUM 8.4 12/16/2014 0334   PROT 5.9 (L) 12/14/2014 1543   ALBUMIN 3.1 (L) 12/14/2014 1543   AST 63 (H) 12/14/2014 1543   ALT 48 12/14/2014 1543   ALKPHOS 51 12/14/2014 1543   BILITOT 0.8 12/14/2014 1543   GFRNONAA >90 12/16/2014 0334  GFRAA >90 12/16/2014 0334      Lab Results  Component Value Date   TSH 2.066 12/15/2014     RADIOGRAPHY: CT SOFT TISSUE NECK W CONTRAST  Result Date: 05/11/2020 CLINICAL DATA:  Swelling the right cheek and jaw region the last 3-4 weeks. EXAM: CT NECK WITH CONTRAST TECHNIQUE: Multidetector CT imaging of the neck was performed using the standard protocol following the bolus administration of intravenous contrast. CONTRAST:  50mL ISOVUE-300 IOPAMIDOL (ISOVUE-300) INJECTION 61% COMPARISON:  None. FINDINGS: Pharynx and larynx: Question 1.5 cm mass at the base of the tongue on the right. No other mucosal or submucosal lesion suspected. Salivary glands: Parotid and submandibular glands are normal. Thyroid: Normal Lymph nodes: Pathologically enlarged right level 2 and level 3 lymph nodes measuring 2.2 and 2.3 cm in size. Indistinct margins raise the possibility of extracapsular spread of tumor. Vascular: Ordinary  atherosclerotic change at the carotid bifurcations. Limited intracranial: Normal Visualized orbits: Normal Mastoids and visualized paranasal sinuses: Clear Skeleton: Ordinary cervical spondylosis and facet osteoarthritis. Upper chest: Scarring at the lung apices.  Active process. Other: None IMPRESSION: Suspicion of a 1.5 cm mass at the base of the tongue on the right. Pathologically enlarged right level 2 and level 3 lymph nodes measuring 2.2 and 2.3 cm in size. Indistinct margins raise the possibility of extracapsular spread of tumor. Electronically Signed   By: Nelson Chimes M.D.   On: 05/11/2020 13:02   NM PET Image Initial (PI) Skull Base To Thigh  Result Date: 06/06/2020 CLINICAL DATA:  Initial treatment strategy for oropharyngeal carcinoma. EXAM: NUCLEAR MEDICINE PET SKULL BASE TO THIGH TECHNIQUE: 9.0 mCi F-18 FDG was injected intravenously. Full-ring PET imaging was performed from the skull base to thigh after the radiotracer. CT data was obtained and used for attenuation correction and anatomic localization. Fasting blood glucose: 89 mg/dl COMPARISON:  CT scan 05/11/2020 FINDINGS: Mediastinal blood pool activity: SUV max 2.38 Liver activity: SUV max NA NECK: Focal area of hypermetabolism is noted at the right tongue base with SUV max of 12.62. This is consistent with the primary mucosal neoplasm. Right level 2 and level 3 lymph nodes are hypermetabolic with SUV max of 91.63. No contralateral neck adenopathy. Incidental CT findings: Bilateral carotid artery calcifications are noted. CHEST: No supraclavicular or axillary adenopathy. No mediastinal or hilar lymphadenopathy. No worrisome pulmonary nodules to suggest pulmonary metastatic disease. Mild stable emphysematous changes. Moderate FDG uptake is noted in the right atrial and left atrial walls. This can be seen with atrial fibrillation. Recommend clinical correlation with cardiac history. Incidental CT findings: Tortuosity and calcification of the  thoracic aorta and coronary artery calcifications are noted. ABDOMEN/PELVIS: No abnormal hypermetabolic activity within the liver, pancreas, adrenal glands, or spleen. No hypermetabolic lymph nodes in the abdomen or pelvis. Incidental CT findings: Moderate atherosclerotic calcifications involving the abdominal aorta and iliac arteries but no aneurysm. SKELETON: No significant findings. A right total hip arthroplasty is noted. Moderate bilateral SI joint degenerative changes. Incidental CT findings: none IMPRESSION: 1. Hypermetabolic right tongue base mass consistent with known neoplasm. Right level 2 and level 3 metastatic adenopathy. 2. No findings for distant metastatic disease involving the chest, abdomen or pelvis. 3. Moderate FDG uptake in the right and left atrial walls most typically seen with atrial fibrillation. Recommend clinical correlation. Electronically Signed   By: Marijo Sanes M.D.   On: 06/06/2020 12:48      IMPRESSION/PLAN:  This is a delightful patient with head and neck cancer. I recommend radiotherapy for  this patient.  Anticipate he will receive concurrent chemotherapy; he is not felt to be a good candidate for transoral robotic surgery based on my conversation today with otolaryngology.  Medical oncology appointment is pending.  We discussed the potential risks, benefits, and side effects of radiotherapy. We talked in detail about acute and late effects. We discussed that some of the most bothersome acute effects may be mucositis, dysgeusia, salivary changes, skin irritation, hair loss, dehydration, weight loss and fatigue. We talked about late effects which include but are not necessarily limited to dysphagia, hypothyroidism, nerve injury, spinal cord injury, xerostomia, trismus, and neck edema. No guarantees of treatment were given. A consent form was signed and placed in the patient's medical record. The patient is enthusiastic about proceeding with treatment. I look forward to  participating in the patient's care.    Simulation (treatment planning) will take place following clearance from dentistry.  Some of his teeth are in suboptimal condition and anticipate he may undergo extractions.  We talked about the likely need for a feeding tube (PEG) temporarily if he undergoes concurrent chemotherapy.  This will be discussed further with medical oncology.  We also discussed that the treatment of head and neck cancer is a multidisciplinary process to maximize treatment outcomes and quality of life. For this reason the following referrals have been or will be made:   Medical oncology to discuss chemotherapy    Dentistry for dental evaluation, possible extractions in the radiation fields, and /or advice on reducing risk of cavities, osteoradionecrosis, or other oral issues.   Nutritionist for nutrition support during and after treatment.   Speech language pathology for swallowing and/or speech therapy.   Social work for social support.    Physical therapy due to risk of lymphedema in neck and deconditioning.   Baseline labs including TSH.  On date of service, in total, I spent 60 minutes on this encounter. Patient was seen in person.  __________________________________________   Eppie Gibson, MD

## 2020-06-09 NOTE — Progress Notes (Signed)
Oncology Nurse Navigator Documentation  At the request of Dr. Isidore Moos, I placed a phone call to Pathology. I requested a P16 test be added to Pathology from 05/22/20. Pathology verbalized that they would put the order in.   [employer] RN, BSN, OCN Head & Neck Oncology Nurse [place] at [facility] Phone # [phone]  Fax # [phone]

## 2020-06-09 NOTE — Progress Notes (Signed)
Dental Form with Estimates of Radiation Dose      Diagnosis:    ICD-10-CM   1. Squamous cell carcinoma of base of tongue ([place])  C01 Ambulatory referral to Social Work    Prognosis: curable  Anticipated # of fractions: 35    Daily?: yes  # of weeks of radiotherapy: 7  Chemotherapy?: anticipated  Anticipated xerostomia:  Mild permanent    Pre-simulation needs:   Scatter protection   Simulation: ASAP   Other Notes: do not anticipate 50Gy over roots, but patient reports dental issues.  Please contact Eppie Gibson, MD, with patient's disposition after evaluation and/or dental treatment.

## 2020-06-09 NOTE — Progress Notes (Signed)
Oncology Nurse Navigator Documentation  Met with patient during initial consult with Mr. Savant. He was accompanied by his significant other, Butch Penny . Further introduced myself as his/their Navigator, explained my role as a member of the Care Team. . Provided New Patient Information packet: o Contact information for physician, this navigator, other members of the Care Team o Advance Directive information ([place] blue pamphlet with LCSW insert); provided [facility] AD booklet at his request,  o Fall Prevention Patient [place] sheet o Symptom Management Clinic information o [facility] campus map with highlight of [place] o SLP Information sheet . Provided and discussed educational handouts for PEG and PAC. Marland Kitchen Assisted with post-consult appt scheduling. . Toured them to the CT simulation area and LINAC treatment area.  . They verbalized understanding of information provided. . I encouraged them to call with questions/concerns moving forward.  Harlow Asa, RN, BSN, OCN Head & Neck Oncology Nurse [place] at [place] [phone]

## 2020-06-11 NOTE — Progress Notes (Signed)
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 P16 status pending.  05/11/2020: CT of the neck showed a 1.5 cm mass at the base of the tongue on the right and pathologically enlarged right level 2 and level 3 lymph nodes 05/22/2020: direct laryngoscopy with biopsy. Mass biopsied at the right base of the tongue. Biopsy showed invasive moderately differentiated squamous cell carcinoma 06/06/2020: PET CT scan showed hypermetabolic right tongue base mass consistent with known neoplasm. Right level 2 and level 3 metastatic adenopathy. No evidence of metastatic disease.  06/09/2020: established care with Dr. Isidore Moos 06/12/2020: establish care with Dr. Lorenso Courier   CHIEF COMPLAINTS/PURPOSE OF CONSULTATION:  "Squamous Cell Carcinoma of the Right Base of the Tongue"  HISTORY OF PRESENTING ILLNESS:  Patrick Nelson 69 y.o. 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   On review of the previous records Patrick Nelson underwent a CT scan of his neck on 05/11/2020 due to an enlarged lymph node.  The scan that showed a 1.5 cm mass at the base of the tongue as well as enlarged right level 2 and 3 lymph nodes.  On 05/22/2020 the patient underwent direct laryngoscopy with biopsy performed by ENT.  The mass biopsy of the right base of the tongue had pathology returned positive for invasive moderately differentiated squamous cell carcinoma.  On 06/06/2020 the patient underwent a PET CT scan which  showed hypermetabolic right tongue base mass consistent with no neoplasm.  The right level 2 and 3 metastatic adenopathy was also noted.  Due to concern for this finding the patient was referred to radiation and medical oncology for further evaluation.  On exam today Patrick Nelson is accompanied by his wife.  He notes that his symptoms began with a swelling on the right side of his neck which appeared approximately 4 to 5 months ago.  He notes that when it failed to resolve he presented to his primary care provider who began the current work-up.  He notes that it is nontender and he has not had any soreness in his mouth or in the back of his throat.  He also notes that he has not had any weight loss, fevers, chills, sweats, nausea, vomiting or diarrhea since the time of diagnosis.  A full 10 point ROS is listed below.  On further discussion he notes that he is a never smoker, though he did have a rare cigar on occasion when golfing.  He notes that he is a social drinker and drinks approximately 2 beers a week, predominantly on the weekends.  He previously worked in the [employer] and is currently a Regulatory affairs officer.  His family history is unremarkable save for heart disease in his mother and father.  His own personal past medical history is relatively unremarkable save for hyperlipidemia and atrial fibrillation.  MEDICAL HISTORY:  Past Medical History:  Diagnosis Date  . Hyperlipidemia   . Paroxysmal atrial fibrillation ([place])     SURGICAL HISTORY: Past Surgical History:  Procedure Laterality Date  . DIRECT LARYNGOSCOPY N/A 05/22/2020  Procedure: DIRECT LARYNGOSCOPY w/BIOPSY TONGUE BASE;  Surgeon: Izora Gala, MD;  Location: [facility];  Service: ENT;  Laterality: N/A;  . TOTAL HIP ARTHROPLASTY  2012   right hip-Dr. Berenice Primas    SOCIAL HISTORY: Social History   Socioeconomic History  . Marital status: Significant Other    Spouse name: Not on file  . Number of  children: 1  . Years of education: Not on file  . Highest education level: Not on file  Occupational History  . Not on file  Tobacco Use  . Smoking status: Never Smoker  . Smokeless tobacco: Never Used  Vaping Use  . Vaping Use: Never used  Substance and Sexual Activity  . Alcohol use: Yes  . Drug use: No  . Sexual activity: Yes  Other Topics Concern  . Not on file  Social History Narrative  . Not on file   Social Determinants of Health   Financial Resource Strain:   . Difficulty of Paying Living Expenses: Not on file  Food Insecurity:   . Worried About Charity fundraiser in the Last Year: Not on file  . Ran Out of Food in the Last Year: Not on file  Transportation Needs:   . Lack of Transportation (Medical): Not on file  . Lack of Transportation (Non-Medical): Not on file  Physical Activity:   . Days of Exercise per Week: Not on file  . Minutes of Exercise per Session: Not on file  Stress:   . Feeling of Stress : Not on file  Social Connections:   . Frequency of Communication with Friends and Family: Not on file  . Frequency of Social Gatherings with Friends and Family: Not on file  . Attends Religious Services: Not on file  . Active Member of Clubs or Organizations: Not on file  . Attends Archivist Meetings: Not on file  . Marital Status: Not on file  Intimate Partner Violence:   . Fear of Current or Ex-Partner: Not on file  . Emotionally Abused: Not on file  . Physically Abused: Not on file  . Sexually Abused: Not on file    FAMILY HISTORY: Family History  Problem Relation Age of Onset  . Hypertension Mother   . Heart disease Mother   . Heart disease Brother     ALLERGIES:  has No Known Allergies.  MEDICATIONS:  Current Outpatient Medications  Medication Sig Dispense Refill  . acetaminophen (TYLENOL) 325 MG tablet Take 650 mg by mouth every 6 (six) hours as needed.    Marland Kitchen aspirin 81 MG chewable tablet Chew by mouth daily.    Marland Kitchen amoxicillin  (AMOXIL) 500 MG capsule Take four capsules one hour before dental appointment. 4 capsule 3  . atorvastatin (LIPITOR) 10 MG tablet Take 10 mg by mouth daily.    . Cholecalciferol (VITAMIN D3 SUPER STRENGTH) 50 MCG (2000 UT) TABS Take 2,000 Units by mouth.    . cyanocobalamin 1000 MCG tablet Take 1,000 mcg by mouth daily.    . folic acid (FOLVITE) 892 MCG tablet Take 400 mcg by mouth daily.    . melatonin 5 MG TABS Take 5 mg by mouth at bedtime.    . Misc Natural Products (CHROMIUM PICOLINATE FORTIFIED PO) Take by mouth.    . Nutritional Supplements (ANTI-INFLAMMATORY ENZYME) CAPS Take by mouth. Recover AI    . sodium fluoride (PREVIDENT 5000 PLUS) 1.1 % CREA dental cream Apply to tooth brush. Brush teeth for 2 minutes. Spit out excess-DO NOT  swallow. Repeat nightly. 102 g prn   No current facility-administered medications for this visit.    REVIEW OF SYSTEMS:   Constitutional: ( - ) fevers, ( - )  chills , ( - ) night sweats Eyes: ( - ) blurriness of vision, ( - ) double vision, ( - ) watery eyes Ears, nose, mouth, throat, and face: ( - ) mucositis, ( - ) sore throat Respiratory: ( - ) cough, ( - ) dyspnea, ( - ) wheezes Cardiovascular: ( - ) palpitation, ( - ) chest discomfort, ( - ) lower extremity swelling Gastrointestinal:  ( - ) nausea, ( - ) heartburn, ( - ) change in bowel habits Skin: ( - ) abnormal skin rashes Lymphatics: ( - ) new lymphadenopathy, ( - ) easy bruising Neurological: ( - ) numbness, ( - ) tingling, ( - ) new weaknesses Behavioral/Psych: ( - ) mood change, ( - ) new changes  All other systems were reviewed with the patient and are negative.  PHYSICAL EXAMINATION: ECOG PERFORMANCE STATUS: 0 - Asymptomatic  Vitals:   06/12/20 1248  BP: (!) 144/85  Pulse: 68  Resp: 20  Temp: (!) 97.4 F (36.3 C)  SpO2: 98%   Filed Weights   06/12/20 1248  Weight: 176 lb 6.4 oz (80 kg)    GENERAL: well appearing elderly Caucasian male in NAD. Appears fit.   SKIN: skin  color, texture, turgor are normal, no rashes or significant lesions EYES: conjunctiva are pink and non-injected, sclera clear OROPHARYNX: no exudate, no erythema; lips, buccal mucosa, and tongue normal  NECK: large right sided lymph node LYMPH:  Large palpable right sided lymph node. Otherwise no palpable lymphadenopathy in the cervical, axillary or supraclavicular lymph nodes.  LUNGS: clear to auscultation and percussion with normal breathing effort HEART: regular rate & rhythm and no murmurs and no lower extremity edema Musculoskeletal: no cyanosis of digits and no clubbing. Large varicose veins on legs bilaterally.   PSYCH: alert & oriented x 3, fluent speech NEURO: no focal motor/sensory deficits  LABORATORY DATA:  I have reviewed the data as listed CBC Latest Ref Rng & Units 12/15/2014 12/14/2014 02/17/2009  WBC 4.0 - 10.5 K/uL 4.8 6.9 4.1  Hemoglobin 13.0 - 17.0 g/dL 14.4 16.0 15.5  Hematocrit 39 - 52 % 42.9 46.8 44.8  Platelets 150 - 400 K/uL 138(L) 128(L) 213    CMP Latest Ref Rng & Units 12/16/2014 12/15/2014 12/14/2014  Glucose 70 - 99 mg/dL 94 113(H) 110(H)  BUN 6 - 23 mg/dL 12 13 15   Creatinine 0.50 - 1.35 mg/dL 0.82 0.89 0.97  Sodium 135 - 145 mmol/L 136 135 135  Potassium 3.5 - 5.1 mmol/L 3.6 3.8 4.0  Chloride 96 - 112 mmol/L 102 101 100  CO2 19 - 32 mmol/L 29 28 26   Calcium 8.4 - 10.5 mg/dL 8.4 8.4 8.1(L)  Total Protein 6.0 - 8.3 g/dL - - 5.9(L)  Total Bilirubin 0.3 - 1.2 mg/dL - - 0.8  Alkaline Phos 39 - 117 U/L - - 51  AST 0 - 37 U/L - - 63(H)  ALT 0 - 53 U/L - - 48     PATHOLOGY: SURGICAL PATHOLOGY  CASE: [id number]  PATIENT: Patrick Nelson  Surgical Pathology Report   Clinical History: tongue mass (cm)   FINAL MICROSCOPIC DIAGNOSIS:   A. TONGUE, RIGHT BASE, BIOPSY:  - Invasive moderately differentiated squamous cell carcinoma, see  comment   COMMENT:   Dr. Tresa Moore reviewed the case and concurs with the  diagnosis. Dr. Constance Holster  was notified on  05/23/2020.   GROSS DESCRIPTION:   Received in formalin are tan, soft tissue fragments that are submitted  in toto. Number: Multiple fragments size: Average 0.5 cm blocks: 1 (AK  05/22/2020)   Final Diagnosis performed by Jaquita Folds, MD.  Electronically  signed 05/23/2020    RADIOGRAPHIC STUDIES: I have personally reviewed the radiological images as listed and agreed with the findings in the report: FDG avid right tongue base mass and right neck lymph nodes x 2.  NM PET Image Initial (PI) Skull Base To Thigh  Result Date: 06/06/2020 CLINICAL DATA:  Initial treatment strategy for oropharyngeal carcinoma. EXAM: NUCLEAR MEDICINE PET SKULL BASE TO THIGH TECHNIQUE: 9.0 mCi F-18 FDG was injected intravenously. Full-ring PET imaging was performed from the skull base to thigh after the radiotracer. CT data was obtained and used for attenuation correction and anatomic localization. Fasting blood glucose: 89 mg/dl COMPARISON:  CT scan 05/11/2020 FINDINGS: Mediastinal blood pool activity: SUV max 2.38 Liver activity: SUV max NA NECK: Focal area of hypermetabolism is noted at the right tongue base with SUV max of 12.62. This is consistent with the primary mucosal neoplasm. Right level 2 and level 3 lymph nodes are hypermetabolic with SUV max of 43.32. No contralateral neck adenopathy. Incidental CT findings: Bilateral carotid artery calcifications are noted. CHEST: No supraclavicular or axillary adenopathy. No mediastinal or hilar lymphadenopathy. No worrisome pulmonary nodules to suggest pulmonary metastatic disease. Mild stable emphysematous changes. Moderate FDG uptake is noted in the right atrial and left atrial walls. This can be seen with atrial fibrillation. Recommend clinical correlation with cardiac history. Incidental CT findings: Tortuosity and calcification of the thoracic aorta and coronary artery calcifications are noted. ABDOMEN/PELVIS: No abnormal hypermetabolic activity within the liver,  pancreas, adrenal glands, or spleen. No hypermetabolic lymph nodes in the abdomen or pelvis. Incidental CT findings: Moderate atherosclerotic calcifications involving the abdominal aorta and iliac arteries but no aneurysm. SKELETON: No significant findings. A right total hip arthroplasty is noted. Moderate bilateral SI joint degenerative changes. Incidental CT findings: none IMPRESSION: 1. Hypermetabolic right tongue base mass consistent with known neoplasm. Right level 2 and level 3 metastatic adenopathy. 2. No findings for distant metastatic disease involving the chest, abdomen or pelvis. 3. Moderate FDG uptake in the right and left atrial walls most typically seen with atrial fibrillation. Recommend clinical correlation. Electronically Signed   By: Marijo Sanes M.D.   On: 06/06/2020 12:48    ASSESSMENT & PLAN Lionardo Haze 69 y.o. 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  After review the labs, review the records, discussion with the patient the findings are most consistent with a newly diagnosed squamous cell carcinoma the right base of the tongue.  The imaging is concerning for extracapsular extension of one of the lymph nodes.  After review by surgery and radiation oncology the patient is an excellent candidate for chemoradiation treatment.  The patient was evaluated by dentistry who noted that he requires a tooth extraction prior to beginning radiation therapy.  This will delay the start by approximately 2 weeks time.  We will plan to begin chemotherapy once radiation has been scheduled.  On exam today Patrick Nelson is quite healthy and active for his age.  He is an excellent candidate for chemotherapy.  We do not have any baseline labs recently drawn, so we will perform  those today to assure that he is a good candidate for cisplatin weekly treatment.  Today the bulk of our  discussion focused on the diagnosis and the treatment options moving forward.  The patient voices understanding and was willing and able to proceed with chemoradiation treatment once he is healed from his tooth extraction.  Treatment will consist of chemoradiation with cisplatin 40mg /m2 q weekly x 7 weeks concurrent with radiation therapy. The patient will be seen weekly with each treatment. Plan for PET CT scan 12 weeks after completion of chemoradiation.   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 This will be of prognostic significance --radiation to start 2 weeks after tooth extraction as planned by dentistry. Will wait until radiation notifies [place] of start date to schedule f/u and chemotherapy --will schedule chemotherapy education. Port and PEG placement scheduled for 06/26/2020.  --today will order baseline CMP, CBC, and TSH --RTC on Day 1 of chemotherapy.   #Supportive Therapy --prior to start of chemotherapy will prescribe zofran 8mg  PO q8H PRN and compazine 10mg  PO q 6 H PRN --EMLA cream for port --can add immodium PRN if required for diarrhea.   #Atrial Fibrillation --continue ASA 81mg  PO as prescribed.   Orders Placed This Encounter  Procedures  . CBC with Differential (Cancer Center Only)    Standing Status:   Future    Number of Occurrences:   1    Standing Expiration Date:   06/12/2021  . CMP ([facility] only)    Standing Status:   Future    Number of Occurrences:   1    Standing Expiration Date:   06/12/2021  . TSH    Standing Status:   Future    Number of Occurrences:   1    Standing Expiration Date:   06/12/2021    All questions were answered. The patient knows to call the clinic with any problems, questions or concerns.  A total of more than 60 minutes were spent on this encounter and over half of that time was spent on counseling and  coordination of care as outlined above.   Patrick Peoples, MD Department of Hematology/Oncology [place] at [facility] Phone: [phone] Pager: [phone] Email: [email or web address]  06/12/2020 1:56 PM

## 2020-06-12 ENCOUNTER — Other Ambulatory Visit: Payer: Self-pay

## 2020-06-12 ENCOUNTER — Inpatient Hospital Stay: Payer: Medicare Other | Attending: Hematology and Oncology | Admitting: Hematology and Oncology

## 2020-06-12 ENCOUNTER — Inpatient Hospital Stay: Payer: Medicare Other

## 2020-06-12 ENCOUNTER — Ambulatory Visit (HOSPITAL_COMMUNITY): Payer: Self-pay | Admitting: Dentistry

## 2020-06-12 ENCOUNTER — Other Ambulatory Visit: Payer: Self-pay | Admitting: *Deleted

## 2020-06-12 ENCOUNTER — Encounter (HOSPITAL_COMMUNITY): Payer: Self-pay | Admitting: Dentistry

## 2020-06-12 ENCOUNTER — Encounter: Payer: Self-pay | Admitting: Hematology and Oncology

## 2020-06-12 VITALS — BP 166/95 | HR 52 | Temp 98.2°F

## 2020-06-12 VITALS — BP 144/85 | HR 68 | Temp 97.4°F | Resp 20 | Ht 72.0 in | Wt 176.4 lb

## 2020-06-12 DIAGNOSIS — K036 Deposits [accretions] on teeth: Secondary | ICD-10-CM

## 2020-06-12 DIAGNOSIS — K0530 Chronic periodontitis, unspecified: Secondary | ICD-10-CM

## 2020-06-12 DIAGNOSIS — Z23 Encounter for immunization: Secondary | ICD-10-CM | POA: Diagnosis not present

## 2020-06-12 DIAGNOSIS — K08409 Partial loss of teeth, unspecified cause, unspecified class: Secondary | ICD-10-CM

## 2020-06-12 DIAGNOSIS — I480 Paroxysmal atrial fibrillation: Secondary | ICD-10-CM | POA: Insufficient documentation

## 2020-06-12 DIAGNOSIS — C01 Malignant neoplasm of base of tongue: Secondary | ICD-10-CM | POA: Diagnosis not present

## 2020-06-12 DIAGNOSIS — K029 Dental caries, unspecified: Secondary | ICD-10-CM | POA: Diagnosis not present

## 2020-06-12 DIAGNOSIS — K06010 Localized gingival recession, unspecified: Secondary | ICD-10-CM

## 2020-06-12 DIAGNOSIS — K045 Chronic apical periodontitis: Secondary | ICD-10-CM

## 2020-06-12 DIAGNOSIS — M264 Malocclusion, unspecified: Secondary | ICD-10-CM

## 2020-06-12 DIAGNOSIS — Z79899 Other long term (current) drug therapy: Secondary | ICD-10-CM | POA: Diagnosis not present

## 2020-06-12 DIAGNOSIS — Z01818 Encounter for other preprocedural examination: Secondary | ICD-10-CM

## 2020-06-12 DIAGNOSIS — K031 Abrasion of teeth: Secondary | ICD-10-CM | POA: Diagnosis not present

## 2020-06-12 DIAGNOSIS — M2632 Excessive spacing of fully erupted teeth: Secondary | ICD-10-CM

## 2020-06-12 DIAGNOSIS — K030 Excessive attrition of teeth: Secondary | ICD-10-CM | POA: Diagnosis not present

## 2020-06-12 DIAGNOSIS — K0850 Unsatisfactory restoration of tooth, unspecified: Secondary | ICD-10-CM

## 2020-06-12 DIAGNOSIS — Z7982 Long term (current) use of aspirin: Secondary | ICD-10-CM | POA: Insufficient documentation

## 2020-06-12 LAB — CMP (CANCER CENTER ONLY)
ALT: 54 U/L — ABNORMAL HIGH (ref 0–44)
AST: 30 U/L (ref 15–41)
Albumin: 3.7 g/dL (ref 3.5–5.0)
Alkaline Phosphatase: 85 U/L (ref 38–126)
Anion gap: 8 (ref 5–15)
BUN: 16 mg/dL (ref 8–23)
CO2: 26 mmol/L (ref 22–32)
Calcium: 9.5 mg/dL (ref 8.9–10.3)
Chloride: 106 mmol/L (ref 98–111)
Creatinine: 0.87 mg/dL (ref 0.61–1.24)
GFR, Est AFR Am: >60 mL/min (ref >60)
GFR, Estimated: >60 mL/min (ref >60)
Glucose, Bld: 99 mg/dL (ref 70–99)
Potassium: 4.4 mmol/L (ref 3.5–5.1)
Sodium: 140 mmol/L (ref 135–145)
Total Bilirubin: 0.7 mg/dL (ref 0.3–1.2)
Total Protein: 6.5 g/dL (ref 6.5–8.1)

## 2020-06-12 LAB — CBC WITH DIFFERENTIAL (CANCER CENTER ONLY)
Abs Immature Granulocytes: 0.01 10*3/uL (ref 0.00–0.07)
Basophils Absolute: 0 10*3/uL (ref 0.0–0.1)
Basophils Relative: 1 %
Eosinophils Absolute: 0.1 10*3/uL (ref 0.0–0.5)
Eosinophils Relative: 1 %
HCT: 46.8 % (ref 39.0–52.0)
Hemoglobin: 15.7 g/dL (ref 13.0–17.0)
Immature Granulocytes: 0 %
Lymphocytes Relative: 24 %
Lymphs Abs: 1.3 10*3/uL (ref 0.7–4.0)
MCH: 30.7 pg (ref 26.0–34.0)
MCHC: 33.5 g/dL (ref 30.0–36.0)
MCV: 91.4 fL (ref 80.0–100.0)
Monocytes Absolute: 0.6 10*3/uL (ref 0.1–1.0)
Monocytes Relative: 10 %
Neutro Abs: 3.4 10*3/uL (ref 1.7–7.7)
Neutrophils Relative %: 64 %
Platelet Count: 224 10*3/uL (ref 150–400)
RBC: 5.12 MIL/uL (ref 4.22–5.81)
RDW: 13.1 % (ref 11.5–15.5)
WBC Count: 5.4 10*3/uL (ref 4.0–10.5)
nRBC: 0 % (ref 0.0–0.2)

## 2020-06-12 LAB — TSH: TSH: 0.851 u[IU]/mL (ref 0.320–4.118)

## 2020-06-12 MED ORDER — AMOXICILLIN 500 MG PO CAPS: ORAL_CAPSULE | ORAL | 3 refills | Status: AC

## 2020-06-12 MED ORDER — SODIUM FLUORIDE 1.1 % DT CREA: TOPICAL_CREAM | DENTAL | 99 refills | Status: DC

## 2020-06-12 MED FILL — SODIUM FLUORIDE 5000 PLUS 1: 1.1 | 60 days supply | Qty: 102 | Fill #0

## 2020-06-12 MED FILL — AMOXICILLIN 500 MG CAPSULE: 500 | 1 days supply | Qty: 4 | Fill #0

## 2020-06-12 NOTE — Progress Notes (Signed)
Oncology Nurse Navigator Documentation  Met with patient during initial consult with Dr. Lorenso Courier. He was accompanied by his significant other Patrick Nelson.  . Further introduced myself as his/their Navigator, explained my role as a member of the Care Team. . Assisted with post-consult appt scheduling. . They verbalized understanding of information provided. . He has been scheduled for PAC/ PEG for 07/26/20.  Marland Kitchen He will see Dr. Benson Norway for consultation 06/13/20 and then be scheduled for tooth extractions as necessary.   They know to call me if they have any further questions or concerns. I plan to contact them to arrange PEG tube education when they are here at [facility].  Patrick Asa, RN, BSN, OCN Head & Neck Oncology Nurse [place] at [place] [phone]

## 2020-06-12 NOTE — Progress Notes (Signed)
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

## 2020-06-12 NOTE — Patient Instructions (Signed)

## 2020-06-12 NOTE — Progress Notes (Signed)
DENTAL CONSULTATION  Date of Consultation:  06/12/2020 Patient Name:   Patrick Nelson Date of Birth:   1951-03-02 Medical Record Number: [id number]  COVID 19 SCREENING: The patient does not symptoms concerning for COVID-19 infection (Including fever, chills, cough, or new SHORTNESS OF BREATH).  Patient has had both doses of the Pfizer Covid vaccine by report.  VITALS: BP (!) 166/95 (BP Location: Right Arm)   Pulse (!) 52   Temp 98.2 F (36.8 C)   CHIEF COMPLAINT: Patient referred by Dr. Isidore Moos for dental consultation.  HPI: Patrick Nelson is a 69 year old male referred by Dr. Isidore Moos for dental consultation.  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  Patient with anticipated chemoradiation therapy.  Patient is now seen as part of a prechemoradiation therapy dental protocol examination.  The patient currently denies acute toothaches, swellings, or abscesses.  Patient was last seen by his primary dentist, Dr. Malcolm Metro, for insertion of a porcelain crown on tooth #32.  This was on October 26, 2018.  The patient denies having partial dentures.  The patient denies having dental phobia.  PROBLEM LIST: Patient Active Problem List   Diagnosis Date Noted  . 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 Changing skin lesion 01/07/2019  . Sebaceous cyst 01/07/2019  . Syncope 12/14/2014  . CAP (community acquired pneumonia) 12/14/2014  . Right lower lobe pneumonia 12/14/2014  . Parapneumonic effusion 12/14/2014  . Paroxysmal atrial fibrillation ([place]) 01/25/2014  . Hyperlipidemia 01/25/2014    PMH: Past Medical History:  Diagnosis Date  . Hyperlipidemia   . Paroxysmal atrial fibrillation (HCC)     PSH: Past Surgical History:  Procedure Laterality Date  . DIRECT LARYNGOSCOPY N/A 05/22/2020   Procedure: DIRECT LARYNGOSCOPY w/BIOPSY TONGUE BASE;  Surgeon: Izora Gala, MD;  Location: [facility];   Service: ENT;  Laterality: N/A;  . TOTAL HIP ARTHROPLASTY  2012   right hip-Dr. Berenice Primas    ALLERGIES: No Known Allergies  MEDICATIONS: Current Outpatient Medications  Medication Sig Dispense Refill  . atorvastatin (LIPITOR) 10 MG tablet Take 10 mg by mouth daily.    . Cholecalciferol (VITAMIN D3 SUPER STRENGTH) 50 MCG (2000 UT) TABS Take 2,000 Units by mouth.    . cyanocobalamin 1000 MCG tablet Take 1,000 mcg by mouth daily.    . folic acid (FOLVITE) 381 MCG tablet Take 400 mcg by mouth daily.    . melatonin 5 MG TABS Take 5 mg by mouth at bedtime.    . Misc Natural Products (CHROMIUM PICOLINATE FORTIFIED PO) Take by mouth.    . Nutritional Supplements (ANTI-INFLAMMATORY ENZYME) CAPS Take by mouth. Recover AI     No current facility-administered medications for this visit.    LABS: Lab Results  Component Value Date   WBC 4.8 12/15/2014   HGB 14.4 12/15/2014   HCT 42.9 12/15/2014   MCV 89.2 12/15/2014   PLT 138 (L) 12/15/2014      Component Value Date/Time   NA 136 12/16/2014 0334   K 3.6 12/16/2014 0334   CL 102 12/16/2014 0334   CO2 29 12/16/2014 0334   GLUCOSE 94 12/16/2014 0334   BUN 12 12/16/2014 0334   CREATININE 0.82 12/16/2014 0334   CALCIUM 8.4 12/16/2014 0334   GFRNONAA >90 12/16/2014 0334   GFRAA >90 12/16/2014 0334   Lab Results  Component Value Date   INR 2.2 (H) 10/23/2007   INR 1.1 10/22/2007  INR 1.0 10/15/2007   No results found for: PTT  SOCIAL HISTORY: Social History   Socioeconomic History  . Marital status: Significant Other    Spouse name: Not on file  . Number of children: 1  . Years of education: Not on file  . Highest education level: Not on file  Occupational History  . Not on file  Tobacco Use  . Smoking status: Never Smoker  . Smokeless tobacco: Never Used  Vaping Use  . Vaping Use: Never used  Substance and Sexual Activity  . Alcohol use: Yes  . Drug use: No  . Sexual activity: Yes  Other Topics Concern  . Not on  file  Social History Narrative  . Not on file   Social Determinants of Health   Financial Resource Strain:   . Difficulty of Paying Living Expenses: Not on file  Food Insecurity:   . Worried About Charity fundraiser in the Last Year: Not on file  . Ran Out of Food in the Last Year: Not on file  Transportation Needs:   . Lack of Transportation (Medical): Not on file  . Lack of Transportation (Non-Medical): Not on file  Physical Activity:   . Days of Exercise per Week: Not on file  . Minutes of Exercise per Session: Not on file  Stress:   . Feeling of Stress : Not on file  Social Connections:   . Frequency of Communication with Friends and Family: Not on file  . Frequency of Social Gatherings with Friends and Family: Not on file  . Attends Religious Services: Not on file  . Active Member of Clubs or Organizations: Not on file  . Attends Archivist Meetings: Not on file  . Marital Status: Not on file  Intimate Partner Violence:   . Fear of Current or Ex-Partner: Not on file  . Emotionally Abused: Not on file  . Physically Abused: Not on file  . Sexually Abused: Not on file    FAMILY HISTORY: Family History  Problem Relation Age of Onset  . Hypertension Mother   . Heart disease Mother   . Heart disease Brother     REVIEW OF SYSTEMS: Reviewed with the patient as per History of present illness. Psych: Patient denies having dental phobia.  DENTAL HISTORY: CHIEF COMPLAINT: Patient referred by Dr. Isidore Moos for dental consultation.  HPI: Patrick Nelson is a 69 year old male referred by Dr. Isidore Moos for dental consultation.  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  Patient with anticipated chemoradiation therapy.  Patient is now seen as part of a prechemoradiation therapy dental protocol examination.  The patient currently denies acute toothaches, swellings, or abscesses.  Patient was last seen by his primary dentist, Dr. Malcolm Metro, for insertion of a porcelain crown on tooth #32.  This was on October 26, 2018.  The patient denies having partial dentures.  The patient denies having dental phobia.   DENTAL EXAMINATION: GENERAL: The patient is a well-developed, well-nourished male in no acute distress. HEAD AND NECK: Patient has right neck lymphadenopathy.  I do not palpate any left neck lymphadenopathy.  The patient has a maximum interincisal opening of 37 mm.  Patient denies having TMJ symptoms. INTRAORAL EXAM: Patient has normal saliva.  There is no evidence of oral abscess formation. DENTITION: Patient is missing tooth #'s 5, 10, 12, 13, 14, 17, 18, 19, 29, and 30.  There is evidence of mandibular anterior incisal attrition.  Multiple flexure  lesions are noted. PERIODONTAL: Patient has chronic periodontitis with plaque and calculus accumulations, gingival recession, and moderate bone loss.  No significant tooth mobility is noted. DENTAL CARIES/SUBOPTIMAL RESTORATIONS: Multiple dental caries and multiple flexure lesions are noted. ENDODONTIC: The patient denies having acute pulpitis symptoms.  There does appear to be incipient periapical radiolucency at the apex of tooth #1.  Patient has had previous root canal therapy associated with tooth numbers 2, 11, and 31.  There may be a persistent periapical radiolucency associated with the distal root of #2 but the patient denies having any sensitivity to percussion or palpation. CROWN AND BRIDGE: Patient has multiple crown and bridge restorations.  The margin on the distal of #31 appears to be suboptimal. PROSTHODONTIC: Patient denies having partial dentures. OCCLUSION: The patient has a poor occlusal scheme secondary to multiple missing teeth, supra eruption and drifting of the unopposed teeth into the edentulous areas, and lack of replacement of all missing teeth with dental prostheses.  RADIOGRAPHIC INTERPRETATION: Orthopantogram was taken today and supplemented with a  full series of dental radiographs. There are multiple missing teeth.  There is supra eruption and drifting of the unopposed teeth into the edentulous areas.  There may be incipient periapical radiolucency at the apex of tooth #1.  Multiple dental caries are noted.  The caries on tooth #1 appears to be impinging on the pulp.  There is evidence of mandibular anterior incisal attrition.  There is evidence of suboptimal dental restorations.   ASSESSMENTS: 1.  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  Prechemoradiation therapy dental protocol 3.  Chronic apical periodontitis 4.  Multiple dental caries 5.  Multiple flexure lesions 6.  Multiple suboptimal dental restorations 7.  Chronic periodontitis with bone loss 8.  Gingival recession  9.  Accretions 10.  Multiple missing teeth 11.  Supra eruption and drifting of the unopposed teeth into the edentulous areas 12.  Poor occlusal scheme and malocclusion 13.  Need for antibiotic premedication prior to invasive dental procedures due to the history of total hip replacement.  PLAN/RECOMMENDATIONS: 1. I discussed the risks, benefits, and complications of various treatment options with the patient in relationship to his medical and dental conditions, anticipated chemoradiation therapy, and chemoradiation therapy side effects to include xerostomia, radiation caries, trismus, mucositis, taste changes, gum and jawbone changes, and risk for infection and osteoradionecrosis. We discussed various treatment options to include no treatment, total and subtotal extractions with alveoloplasty, pre-prosthetic surgery as indicated, periodontal therapy, dental restorations, root canal therapy, crown and bridge therapy, implant therapy, and replacement of missing teeth as indicated.  We also discussed referral to an oral surgeon and referral back to his primary dentist.  The patient currently wishes to proceed with referral to Dr. Frederik Schmidt for evaluation  for extraction of tooth #1 with alveoloplasty.  This has been scheduled for Tuesday, 06/13/2020 at 3:30 PM.  We will also refer the patient back to Dr. Randol Kern for possible dental cleaning and evaluation for restoration of several mandibular anterior teeth as time and space permits.  The patient will require antibiotic premedication prior to invasive dental procedures due to his history of right total hip replacement.  A prescription for amoxicillin 500 mg with directions to take 4 capsules 1 hour prior to invasive dental procedures has been sent to [facility] with 3 refills.  The patient did agree to fabrication of upper and lower fluoride trays and scatter protection devices.  Impressions were obtained today.  Patient  is to return for insertion of the fluoride trays and scatter protection devices after the dental extraction by Dr. Benson Norway.  2. Discussion of findings with medical team and coordination of future medical and dental care as needed.  I spent in excess of  120 minutes during the conduct of this consultation and >50% of this time involved direct face-to-face encounter for counseling and/or coordination of the patient's care.    Nelson Cal, DDS

## 2020-06-13 ENCOUNTER — Telehealth: Payer: Self-pay | Admitting: Hematology and Oncology

## 2020-06-13 ENCOUNTER — Encounter: Payer: Medicare Other | Admitting: Nutrition

## 2020-06-13 ENCOUNTER — Encounter: Payer: Self-pay | Admitting: Radiation Oncology

## 2020-06-13 ENCOUNTER — Encounter: Payer: Self-pay | Admitting: *Deleted

## 2020-06-13 NOTE — Progress Notes (Signed)
[place] Initial Psychosocial Assessment Clinical Social Work   [place] Psychosocial Distress Screening Clinical Social Work  Clinical Social Work was referred by distress screening protocol.  The patient scored a 5 on the Psychosocial Distress Thermometer which indicates moderate distress. Clinical Social Worker contacted patient by phone to assess for distress and other psychosocial needs.   ONCBCN DISTRESS SCREENING 06/09/2020  Screening Type Initial Screening  Distress experienced in past week (1-10) 5  Practical problem type Work/school  Emotional problem type Nervousness/Anxiety;Adjusting to illness  Information Concerns Type Lack of info about diagnosis;Lack of info about treatment;Lack of info about maintaining fitness  Physician notified of physical symptoms No  Referral to clinical psychology No  Referral to clinical social work Yes  Referral to dietition Yes  Referral to financial advocate No  Referral to support programs Yes  Referral to palliative care No    Clinical Social Work contacted by phone to assess psychosocial, emotional, mental health, and spiritual needs of the patient.   Barriers to care/review of distress screen:  - Transportation:  Do you anticipate any problems getting to appointments?  Do you have someone who can help run errands for you if you need it? Patient stated he planned to drive himself to appointments.  In the event he was unable to drive his significant other would provide transportation.    - Help at home:  What is your living situation (alone, family, other)?  If you are physically unable to care for yourself, who would you call on to help you?  - Support system:  What does your support system look like?  Who would you call on if you needed some kind of practical help?  What if you needed someone to talk to for emotional support? Patient identified his significant other as his primary support.  Patient stated "I have lots of support" and can call if  I ever need anything.    - Finances:  Are you concerned about finances.  Considering returning to work?  If not, applying for disability? Patient did not identify and needs or concerns.    Clinical Social Worker provided education on CSW role and information on the support team and support services at [facility].  CSW and patient discussed common and appropriate feelings when diagnosed with cancer and starting treatment.  CSW and patient discussed the importance of support for patient and family.  Patient identified positive support and was also open to additional support information.  CSW will mail patient a packet containing the support program calendar and information.  CSW provided contact information and encouraged patient to call with questions or concerns.      Johnnye Lana, MSW, LCSW, OSW-C Clinical Social Worker [facility] [phone]

## 2020-06-13 NOTE — Telephone Encounter (Signed)
Scheduled per los. Called and spoke with patient. Confirmed appt 

## 2020-06-15 ENCOUNTER — Other Ambulatory Visit: Payer: Self-pay | Admitting: *Deleted

## 2020-06-15 MED ORDER — LIDOCAINE-PRILOCAINE 2.5-2.5 % EX CREA: 1.0000 "application " | TOPICAL_CREAM | CUTANEOUS | 0 refills | Status: DC | PRN

## 2020-06-15 MED ORDER — PROCHLORPERAZINE MALEATE 10 MG PO TABS: 10.0000 mg | ORAL_TABLET | Freq: Four times a day (QID) | ORAL | 0 refills | Status: DC | PRN

## 2020-06-15 MED ORDER — ONDANSETRON HCL 8 MG PO TABS: 8.0000 mg | ORAL_TABLET | Freq: Three times a day (TID) | ORAL | 0 refills | Status: DC | PRN

## 2020-06-16 ENCOUNTER — Other Ambulatory Visit: Payer: Self-pay | Admitting: Radiation Oncology

## 2020-06-16 DIAGNOSIS — C01 Malignant neoplasm of base of tongue: Secondary | ICD-10-CM

## 2020-06-16 MED ORDER — LORAZEPAM 0.5 MG PO TABS: ORAL_TABLET | ORAL | 0 refills | Status: DC

## 2020-06-19 ENCOUNTER — Inpatient Hospital Stay: Payer: Medicare Other

## 2020-06-19 ENCOUNTER — Other Ambulatory Visit: Payer: Self-pay

## 2020-06-19 ENCOUNTER — Telehealth: Payer: Self-pay | Admitting: *Deleted

## 2020-06-19 DIAGNOSIS — Z23 Encounter for immunization: Secondary | ICD-10-CM

## 2020-06-19 NOTE — Telephone Encounter (Signed)
CALLED PATIENT TO ASK ABOUT RESCHEDULING NUTRITION APPT., PATIENT AGREED TO COME ON 06-28-20 @ 11:15 AMT TO SEE BARBARA NEFF

## 2020-06-19 NOTE — Progress Notes (Signed)
   Covid-19 Vaccination Clinic  Name:  Isaiahs Chancy    MRN: [id number] DOB: [birth year]  06/19/2020  Mr. Govan was observed post Covid-19 immunization for 15 minutes without incident. He was provided with Vaccine Information Sheet and instruction to access the V-Safe system.   Mr. Zuckerman was instructed to call 911 with any severe reactions post vaccine: Marland Kitchen Difficulty breathing  . Swelling of face and throat  . A fast heartbeat  . A bad rash all over body  . Dizziness and weakness

## 2020-06-19 NOTE — Progress Notes (Signed)
Oncology Nurse Navigator Documentation  Met with Mr. Hottel and his significant other, Butch Penny to provide PEG/port education prior to 06/26/20 placement.  Provided port educational handout, showed example, provided guidance for post-surgical dsg removal, site care.  . Using  PEG teaching device   and Teach Back, provided education for PEG use and care, including: hand hygiene, gravity bolus administration of daily water flushes and nutritional supplement, fluids and medications; care of tube insertion site including daily dressing change and cleaning; S&S of infection.   . Mr. Rolph correctly verbalized procedures for and provided correct return demonstration of gravity administration of water, dressing change and site care.  . I provided written instructions for PEG flushing/dressing change in support of verbal instruction.   . I provided/described contents of Start of Care Bolus Feeding Kit (3 60 cc syringes, 2 boxes 4x4 drainage sponges, 1 package mesh briefs, 1 roll paper tape, 1 case Osmolite 1.5).  He voiced understanding he is to start using Osmolite per guidance of Nutrition. Marland Kitchen He understands I will be available for ongoing PEG support. Provided barium sulfate prep which I obtained from [facility] IR, reviewed instructions which included guidance for 07/23/20 COVID screening at [address].   Harlow Asa RN, BSN, OCN Head & Neck Oncology Nurse [place] at [facility] Phone # [phone]  Fax # [phone]

## 2020-06-20 ENCOUNTER — Ambulatory Visit: Admission: RE | Admit: 2020-06-20 | Payer: Medicare Other | Source: Ambulatory Visit | Admitting: Radiation Oncology

## 2020-06-20 ENCOUNTER — Ambulatory Visit (HOSPITAL_COMMUNITY): Payer: Dental | Admitting: Dentistry

## 2020-06-20 ENCOUNTER — Other Ambulatory Visit: Payer: Self-pay

## 2020-06-20 ENCOUNTER — Ambulatory Visit
Admission: RE | Admit: 2020-06-20 | Discharge: 2020-06-20 | Disposition: A | Discharge status: Home / Self Care | Payer: Medicare Other | Source: Ambulatory Visit | Attending: Radiation Oncology | Admitting: Radiation Oncology

## 2020-06-20 ENCOUNTER — Encounter (HOSPITAL_COMMUNITY): Payer: Self-pay | Admitting: Dentistry

## 2020-06-20 VITALS — BP 138/86 | HR 59 | Temp 98.0°F

## 2020-06-20 VITALS — BP 129/85 | HR 65 | Temp 98.0°F | Resp 18 | Ht 72.0 in | Wt 174.8 lb

## 2020-06-20 DIAGNOSIS — C01 Malignant neoplasm of base of tongue: Secondary | ICD-10-CM

## 2020-06-20 DIAGNOSIS — Z463 Encounter for fitting and adjustment of dental prosthetic device: Secondary | ICD-10-CM

## 2020-06-20 DIAGNOSIS — Z01818 Encounter for other preprocedural examination: Secondary | ICD-10-CM

## 2020-06-20 MED ORDER — SODIUM CHLORIDE 0.9% FLUSH
10.0000 mL | Freq: Once | INTRAVENOUS | Status: AC
  Administered 2020-06-20: 10 mL via INTRAVENOUS

## 2020-06-20 NOTE — Progress Notes (Signed)
Has armband been applied?  Yes.    Does patient have an allergy to IV contrast dye?: No.   Has patient ever received premedication for IV contrast dye?: No.   Does patient take metformin?: No.   Date of lab work: June 12, 2020 BUN: 16 CR: 0.87  IV site: forearm right, condition patent and no redness  Has IV site been added to flowsheet?  Yes.    BP 129/85 (BP Location: Right Arm, Patient Position: Sitting, Cuff Size: Normal)   Pulse 65   Temp 98 F (36.7 C)   Resp 18   Ht 6' (1.829 m)   Wt 174 lb 12.8 oz (79.3 kg)   SpO2 98%   BMI 23.71 kg/m

## 2020-06-20 NOTE — Patient Instructions (Signed)

## 2020-06-20 NOTE — Progress Notes (Signed)
Oncology Nurse Navigator Documentation  To provide support, encouragement and care continuity, met with Mr. Patrick Nelson during his CT SIM. He was accompanied by his significant other, Butch Penny. He tolerated procedure without difficulty, denied questions/concerns.   I toured him to [facility] treatment area, explained procedures for lobby registration, arrival to Radiation Waiting, arrival to tmt area and preparation for tmt.  He voiced understanding.   I encouraged him to call me prior to 10/4/21[place].  Harlow Asa RN, BSN, OCN Head & Neck Oncology Nurse [place] at [facility] Phone # [phone]  Fax # [phone]

## 2020-06-20 NOTE — Progress Notes (Signed)
06/20/2020   COVID 19 SCREENING: The patient does not symptoms concerning for COVID-19 infection (Including fever, chills, cough, or new SHORTNESS OF BREATH).   Patient Name:   Patrick Nelson Date of Birth:   [birth year] Medical Record Number: [id number]  BP 138/86 (BP Location: Right Arm)   Pulse (!) 59   Temp 98 F (36.7 C)   Derric Dealmeida now presents for insertion of upper and lower fluoride trays and scatter protection devices. Patient had extraction of tooth #1 by Dr. Benson Norway on 06/16/2020.    SUBJECTIVE: Patient with minimal complaints from dental extraction site.  Patient denies having any active bleeding.  EXAM: No sign of infection, heme, or ooze from dental extraction site #1.  ASSESSMENT: Post operative course is consistent with dental extraction of tooth #1 by Dr. Benson Norway.    PROCEDURE: Appliances were tried in and adjusted as needed. [place] Trismus device was fabricated 37 mm using 18 sticks. Postop instructions were provided and a written and verbal format concerning the use and care of appliances. All questions were answered. Patient to call for appointment for oral examination during radiation therapy as needed.  Otherwise, patient will be seen approximately 1 month after the end of radiation therapy.  Patient is aware that he will be seen by Dr. Benson Norway in the future.  Patient to call if questions or problems arise before then.   Lenn Cal, DDS

## 2020-06-21 LAB — SURGICAL PATHOLOGY

## 2020-06-23 ENCOUNTER — Other Ambulatory Visit: Payer: Self-pay | Admitting: Radiology

## 2020-06-23 ENCOUNTER — Other Ambulatory Visit (HOSPITAL_COMMUNITY)
Admission: RE | Admit: 2020-06-23 | Discharge: 2020-06-23 | Disposition: A | Discharge status: Home / Self Care | Payer: Medicare Other | Source: Ambulatory Visit | Attending: Hematology and Oncology | Admitting: Hematology and Oncology

## 2020-06-23 DIAGNOSIS — Z20822 Contact with and (suspected) exposure to covid-19: Secondary | ICD-10-CM | POA: Insufficient documentation

## 2020-06-23 DIAGNOSIS — Z01812 Encounter for preprocedural laboratory examination: Secondary | ICD-10-CM | POA: Diagnosis present

## 2020-06-23 LAB — SARS CORONAVIRUS 2 (TAT 6-24 HRS): SARS Coronavirus 2: NEGATIVE

## 2020-06-26 ENCOUNTER — Ambulatory Visit (HOSPITAL_COMMUNITY)
Admission: RE | Admit: 2020-06-26 | Discharge: 2020-06-26 | Disposition: A | Discharge status: Home / Self Care | Payer: Medicare Other | Source: Ambulatory Visit | Attending: Hematology and Oncology | Admitting: Hematology and Oncology

## 2020-06-26 ENCOUNTER — Telehealth: Payer: Self-pay | Admitting: Hematology and Oncology

## 2020-06-26 ENCOUNTER — Other Ambulatory Visit: Payer: Self-pay | Admitting: Hematology and Oncology

## 2020-06-26 ENCOUNTER — Encounter (HOSPITAL_COMMUNITY): Payer: Self-pay

## 2020-06-26 ENCOUNTER — Other Ambulatory Visit: Payer: Self-pay

## 2020-06-26 DIAGNOSIS — C01 Malignant neoplasm of base of tongue: Secondary | ICD-10-CM

## 2020-06-26 LAB — CBC WITH DIFFERENTIAL/PLATELET
Abs Immature Granulocytes: 0.02 10*3/uL (ref 0.00–0.07)
Basophils Absolute: 0 10*3/uL (ref 0.0–0.1)
Basophils Relative: 0 %
Eosinophils Absolute: 0.1 10*3/uL (ref 0.0–0.5)
Eosinophils Relative: 1 %
HCT: 45.9 % (ref 39.0–52.0)
Hemoglobin: 14.9 g/dL (ref 13.0–17.0)
Immature Granulocytes: 0 %
Lymphocytes Relative: 27 %
Lymphs Abs: 1.9 10*3/uL (ref 0.7–4.0)
MCH: 30.4 pg (ref 26.0–34.0)
MCHC: 32.5 g/dL (ref 30.0–36.0)
MCV: 93.7 fL (ref 80.0–100.0)
Monocytes Absolute: 0.6 10*3/uL (ref 0.1–1.0)
Monocytes Relative: 8 %
Neutro Abs: 4.3 10*3/uL (ref 1.7–7.7)
Neutrophils Relative %: 64 %
Platelets: 202 10*3/uL (ref 150–400)
RBC: 4.9 MIL/uL (ref 4.22–5.81)
RDW: 12.9 % (ref 11.5–15.5)
WBC: 6.8 10*3/uL (ref 4.0–10.5)
nRBC: 0 % (ref 0.0–0.2)

## 2020-06-26 LAB — COMPREHENSIVE METABOLIC PANEL
ALT: 21 U/L (ref 0–44)
AST: 26 U/L (ref 15–41)
Albumin: 3.8 g/dL (ref 3.5–5.0)
Alkaline Phosphatase: 67 U/L (ref 38–126)
Anion gap: 9 (ref 5–15)
BUN: 15 mg/dL (ref 8–23)
CO2: 26 mmol/L (ref 22–32)
Calcium: 8.9 mg/dL (ref 8.9–10.3)
Chloride: 104 mmol/L (ref 98–111)
Creatinine, Ser: 0.89 mg/dL (ref 0.61–1.24)
GFR calc Af Amer: >60 mL/min (ref >60)
GFR calc non Af Amer: >60 mL/min (ref >60)
Glucose, Bld: 100 mg/dL — ABNORMAL HIGH (ref 70–99)
Potassium: 4.5 mmol/L (ref 3.5–5.1)
Sodium: 139 mmol/L (ref 135–145)
Total Bilirubin: 0.6 mg/dL (ref 0.3–1.2)
Total Protein: 6.6 g/dL (ref 6.5–8.1)

## 2020-06-26 LAB — PROTIME-INR
INR: 0.9 (ref 0.8–1.2)
Prothrombin Time: 12.1 seconds (ref 11.4–15.2)

## 2020-06-26 MED ORDER — NEOSPORIN ORIGINAL 3.5-400-5000 EX OINT
1.0000 "application " | TOPICAL_OINTMENT | Freq: Every day | CUTANEOUS | Status: DC
  Filled 2020-06-26: qty 1

## 2020-06-26 MED ORDER — KETOROLAC TROMETHAMINE 15 MG/ML IJ SOLN
INTRAMUSCULAR | Status: AC
  Filled 2020-06-26: qty 1

## 2020-06-26 MED ORDER — LIDOCAINE HCL (PF) 1 % IJ SOLN
INTRAMUSCULAR | Status: AC | PRN
  Administered 2020-06-26 (×3): 10 mL via INTRADERMAL

## 2020-06-26 MED ORDER — SODIUM CHLORIDE 0.9 % IV SOLN: INTRAVENOUS | Status: DC

## 2020-06-26 MED ORDER — FLUMAZENIL 0.5 MG/5ML IV SOLN
INTRAVENOUS | Status: AC
  Filled 2020-06-26: qty 5

## 2020-06-26 MED ORDER — HEPARIN SOD (PORK) LOCK FLUSH 100 UNIT/ML IV SOLN
INTRAVENOUS | Status: AC | PRN
  Administered 2020-06-26: 500 [IU] via INTRAVENOUS

## 2020-06-26 MED ORDER — CEFAZOLIN SODIUM-DEXTROSE 2-4 GM/100ML-% IV SOLN
2.0000 g | INTRAVENOUS | Status: AC
  Administered 2020-06-26: 2 g via INTRAVENOUS

## 2020-06-26 MED ORDER — LIDOCAINE HCL 1 % IJ SOLN
INTRAMUSCULAR | Status: AC
  Filled 2020-06-26: qty 40

## 2020-06-26 MED ORDER — KETOROLAC TROMETHAMINE 15 MG/ML IJ SOLN
15.0000 mg | Freq: Once | INTRAMUSCULAR | Status: AC
  Administered 2020-06-26: 15 mg via INTRAVENOUS

## 2020-06-26 MED ORDER — FENTANYL CITRATE (PF) 100 MCG/2ML IJ SOLN
INTRAMUSCULAR | Status: AC | PRN
  Administered 2020-06-26 (×2): 50 ug via INTRAVENOUS

## 2020-06-26 MED ORDER — CEFAZOLIN SODIUM-DEXTROSE 2-4 GM/100ML-% IV SOLN
INTRAVENOUS | Status: AC
  Filled 2020-06-26: qty 100

## 2020-06-26 MED ORDER — HEPARIN SOD (PORK) LOCK FLUSH 100 UNIT/ML IV SOLN
INTRAVENOUS | Status: AC
  Filled 2020-06-26: qty 5

## 2020-06-26 MED ORDER — MIDAZOLAM HCL 2 MG/2ML IJ SOLN
INTRAMUSCULAR | Status: AC
  Filled 2020-06-26: qty 4

## 2020-06-26 MED ORDER — IOHEXOL 300 MG/ML  SOLN
50.0000 mL | Freq: Once | INTRAMUSCULAR | Status: AC | PRN
  Administered 2020-06-26: 10 mL

## 2020-06-26 MED ORDER — GLUCAGON HCL RDNA (DIAGNOSTIC) 1 MG IJ SOLR
INTRAMUSCULAR | Status: AC
  Filled 2020-06-26: qty 1

## 2020-06-26 MED ORDER — FENTANYL CITRATE (PF) 100 MCG/2ML IJ SOLN
INTRAMUSCULAR | Status: AC
  Filled 2020-06-26: qty 2

## 2020-06-26 MED ORDER — MIDAZOLAM HCL 2 MG/2ML IJ SOLN
INTRAMUSCULAR | Status: AC | PRN
  Administered 2020-06-26 (×2): 1 mg via INTRAVENOUS
  Administered 2020-06-26: 0.5 mg via INTRAVENOUS
  Administered 2020-06-26: 1 mg via INTRAVENOUS
  Administered 2020-06-26: 0.5 mg via INTRAVENOUS

## 2020-06-26 MED ORDER — NALOXONE HCL 0.4 MG/ML IJ SOLN
INTRAMUSCULAR | Status: AC
  Filled 2020-06-26: qty 1

## 2020-06-26 NOTE — Procedures (Signed)
Interventional Radiology Procedure Note  Procedure: Placement of percutaneous 66F pull-through gastrostomy tube. Complications: None Recommendations: - small meals x 24-48 hrs - OK to use tube in am -Routine wound care  Signed,   Dulcy Fanny. Earleen Newport, DO

## 2020-06-26 NOTE — Progress Notes (Signed)
Patient ID: Patrick Nelson, male   DOB: [birth year], 69 y.o.   MRN: [id number] Patient had port placement and gastrostomy tube placement earlier today.  Patient's fiance called to say that patient is having chills and fever of 100 degrees F.  She has given him tylenol but still febrile.  Only has mild pain with movement.  At this point, no need to see patient urgently.  Recommend tylenol as needed and rest this evening.  Will contact patient in the morning to see how he is feeling.

## 2020-06-26 NOTE — Discharge Instructions (Signed)
Urgent needs - IR on call MD [phone] Pain Control - ICE or tylenol as directed  Port Wound (right chest) - May remove dressing and shower in 24 to 48 hours.  Keep site clean and dry.  Replace with bandaid. Do not submerge in tub or water until site healing well.  If ordered by your provider, may start Emla cream in 2 weeks or after incision is healed.  After completion of treatment, your provider should have you set up for monthly port flushes.   Feeding tube wound (abdomen) - Do not submerge in tub, pool or water.    Recommendations: - small meals for first 24-48 hrs at home, start with clear liquids, progress as tolerated - OK to use tube in am, use tube only for liquids (not crushed meds) -Routine wound care - see G tube (feeding tube) instructions, starting evening 06/27/20  Dose Frequency Start End  neomycin-bacitracin-polymyxin (NEOSPORIN) ointment packet 1 application      Admin Instructions: Cleanse PEG site with soap and water and dress with triple antibiotic ointment and cover daily with gauze for 7 days.  Route: Topical   Flush tube with 10 ml water after medications or at least every 12 hours May want to cover feeding tube site for showers for first week, insure site is clean and dry prior to application of ointment and dressing.   Implanted Port Insertion, Care After This sheet gives you information about how to care for yourself after your procedure. Your health care provider may also give you more specific instructions. If you have problems or questions, contact your health care provider. What can I expect after the procedure? After the procedure, it is common to have:  Discomfort at the port insertion site.  Bruising on the skin over the port. This should improve over 3-4 days. Follow these instructions at home: [facility] care  After your port is placed, you will get a manufacturer's information card. The card has information about your port. Keep this card with you  at all times.  Take care of the port as told by your health care provider. Ask your health care provider if you or a family member can get training for taking care of the port at home. A home health care nurse may also take care of the port.  Make sure to remember what type of port you have. Incision care  Follow instructions from your health care provider about how to take care of your port insertion site. Make sure you: ? Wash your hands with soap and water before and after you change your bandage (dressing). If soap and water are not available, use hand sanitizer. ? Change your dressing as told by your health care provider. ? Leave stitches (sutures), skin glue, or adhesive strips in place. These skin closures may need to stay in place for 2 weeks or longer. If adhesive strip edges start to loosen and curl up, you may trim the loose edges. Do not remove adhesive strips completely unless your health care provider tells you to do that.  Check your port insertion site every day for signs of infection. Check for: ? Redness, swelling, or pain. ? Fluid or blood. ? Warmth. ? Pus or a bad smell. Activity  Return to your normal activities as told by your health care provider. Ask your health care provider what activities are safe for you.  Do not lift anything that is heavier than 10 lb (4.5 kg), or the limit that you are told,  until your health care provider says that it is safe. General instructions  Take over-the-counter and prescription medicines only as told by your health care provider.  Do not take baths, swim, or use a hot tub until your health care provider approves. Ask your health care provider if you may take showers. You may only be allowed to take sponge baths.  Do not drive for 24 hours if you were given a sedative during your procedure.  Wear a medical alert bracelet in case of an emergency. This will tell any health care providers that you have a port.  Keep all follow-up  visits as told by your health care provider. This is important. Contact a health care provider if:  You cannot flush your port with saline as directed, or you cannot draw blood from the port.  You have a fever or chills.  You have redness, swelling, or pain around your port insertion site.  You have fluid or blood coming from your port insertion site.  Your port insertion site feels warm to the touch.  You have pus or a bad smell coming from the port insertion site. Get help right away if:  You have chest pain or shortness of breath.  You have bleeding from your port that you cannot control. Summary  Take care of the port as told by your health care provider. Keep the manufacturer's information card with you at all times.  Change your dressing as told by your health care provider.  Contact a health care provider if you have a fever or chills or if you have redness, swelling, or pain around your port insertion site.  Keep all follow-up visits as told by your health care provider. This information is not intended to replace advice given to you by your health care provider. Make sure you discuss any questions you have with your health care provider. Document Revised: 04/14/2018 Document Reviewed: 04/14/2018 Elsevier Patient Education  [place].  Gastrostomy Tube Home Guide, Adult (follow up with nurse navigator regarding questions) A gastrostomy tube, or G-tube, is a tube that is inserted through the abdomen into the stomach. The tube is used to give feedings and medicines when a person is unable to eat and drink enough on his or her own. How to care for a G-tube Supplies needed  Saline solution or clean, warm water and soap.  Cotton swab or gauze.  Precut gauze bandage (dressing) and tape, if needed. Instructions 1. Wash your hands with soap and water. 2. If there is a dressing between the person's skin and the tube, remove it. 3. Check the area where the tube  enters the skin. Check for problems such as: ? Redness. ? Swelling. ? Pus-like drainage. ? Extra skin growth. 4. Moisten the cotton swab with the saline solution or soap and water mixture. Gently clean around the insertion site. Remove any drainage or crusting. ? When the G-tube is first put in, a normal saline solution or water can be used to clean the skin. ? Mild soap and warm water can be used when the skin around the G-tube site has healed. 5. If there should be a dressing between the person's skin and the tube, apply it at this time. How to flush a G-tube Flush the G-tube regularly to keep it from clogging. Flush it before and after feedings and as often as told by the health care provider. Supplies needed  Purified or sterile water, warmed. If the person has a weak disease-fighting (  immune) system, or if he or she has difficulty fighting off infections (is immunocompromised), use only sterile water. ? If you are unsure about the amount of chemical contaminants in purified or drinking water, use sterile water. ? To purify drinking water by boiling:  Boil water for at least 1 minute. Keep lid over water while it boils. Allow water to cool to room temperature before using.  60cc G-tube syringe. Instructions 1. Wash your hands with soap and water. 2. Draw up 30 mL of warm water in a syringe. 3. Connect the syringe to the tube. 4. Slowly and gently push the water into the tube. G-tube problems and solutions  If the tube comes out: ? Cover the opening with a clean dressing and tape. ? Call a health care provider right away. ? A health care provider will need to put the tube back in within 4 hours.  If there is skin or scar tissue growing where the tube enters the skin: ? Keep the area clean and dry. ? Secure the tube with tape so that the tube does not move around too much. ? Call a health care provider.  If the tube gets clogged: ? Slowly push warm water into the tube with a  large syringe. ? Do not force the fluid into the tube or push an object into the tube. ? If you are not able to unclog the tube, call a health care provider right away. Follow these instructions at home: Feedings  Give feedings at room temperature.  Cover and place unused feedings in the refrigerator.  If feedings are continuous: ? Do not put more than 4 hours worth of feedings in the feeding bag. ? Stop the feedings when you need to give medicine or flush the tube. Be sure to restart the feedings. ? Make sure the person's head is above his or her stomach (upright position). This will prevent choking and discomfort.  Replace feeding bags and syringes as told by the health care provider.  Make sure the person is in the right position during and after feedings: ? During feedings, the person's position should be in the upright position. ? After a noncontinuous feeding (bolus feeding), have the person stay in the upright position for 1 hour. General instructions  Only use syringes made for G-tubes.  Do not pull or put tension on the tube.  Clamp the tube before removing the cap or disconnecting a syringe.  Measure the length of the G-tube every day from the insertion site to the end of the tube.  If the person's G-tube has a balloon, check the fluid in the balloon every week. The amount of fluid that should be in the balloon can be found in the manufacturer's specifications.  Make sure the person takes care of his or her oral health, such as by brushing his or her teeth.  Remove excess air from the G-tube as told by the person's health care provider. This is called "venting."  Keep the area where the tube enters the skin clean and dry.  Do not push feedings, medicines, or flushes rapidly. Contact a health care provider if:  The person with the tube has any of these problems: ? Constipation. ? Fever.  There is a large amount of fluid or mucus-like liquid leaking from the  tube.  Skin or scar tissue appears to be growing where the tube enters the skin.  The length of tube from the insertion site to the G-tube gets longer. Get help  right away if:  The person with the tube has any of these problems: ? Severe abdominal pain. ? Severe tenderness. ? Severe bloating. ? Nausea. ? Vomiting. ? Trouble breathing. ? Shortness of breath.  Any of these problems happen in the area where the tube enters the skin: ? Redness, irritation, swelling, or soreness. ? Pus-like discharge. ? A bad smell.  The tube is clogged and cannot be flushed.  The tube comes out. Summary  A gastrostomy tube, or G-tube, is a tube that is inserted through the abdomen into the stomach. The tube is used to give feedings and medicines when a person is unable to eat and drink enough on his or her own.  Check and clean the insertion site daily as told by the person's health care provider.  Flush the G-tube regularly to keep it from clogging. Flush it before and after feedings and as often as told by the person's health care provider.  Keep the area where the tube enters the skin clean and dry. This information is not intended to replace advice given to you by your health care provider. Make sure you discuss any questions you have with your health care provider. Document Revised: 08/29/2017 Document Reviewed: 11/11/2016 Elsevier Patient Education  [place].  Moderate Conscious Sedation, Adult, Care After These instructions provide you with information about caring for yourself after your procedure. Your health care provider may also give you more specific instructions. Your treatment has been planned according to current medical practices, but problems sometimes occur. Call your health care provider if you have any problems or questions after your procedure. What can I expect after the procedure? After your procedure, it is common:  To feel sleepy for several hours.  To feel  clumsy and have poor balance for several hours.  To have poor judgment for several hours.  To vomit if you eat too soon. Follow these instructions at home: For at least 24 hours after the procedure:  Do not: ? Participate in activities where you could fall or become injured. ? Drive. ? Use heavy machinery. ? Drink alcohol. ? Take sleeping pills or medicines that cause drowsiness. ? Make important decisions or sign legal documents. ? Take care of children on your own.  Rest. Eating and drinking  Follow the diet recommended by your health care provider.  If you vomit: ? Drink water, juice, or soup when you can drink without vomiting. ? Make sure you have little or no nausea before eating solid foods. General instructions  Have a responsible adult stay with you until you are awake and alert.  Take over-the-counter and prescription medicines only as told by your health care provider.  If you smoke, do not smoke without supervision.  Keep all follow-up visits as told by your health care provider. This is important. Contact a health care provider if:  You keep feeling nauseous or you keep vomiting.  You feel light-headed.  You develop a rash.  You have a fever. Get help right away if:  You have trouble breathing. This information is not intended to replace advice given to you by your health care provider. Make sure you discuss any questions you have with your health care provider. Document Revised: 08/29/2017 Document Reviewed: 01/06/2016 Elsevier Patient Education  2020 [employer].

## 2020-06-26 NOTE — Procedures (Signed)
Interventional Radiology Procedure Note  Procedure: Placement of a right IJ approach single lumen PowerPort.  Tip is positioned at the superior cavoatrial junction and catheter is ready for immediate use.  Complications: None Recommendations:  - Ok to shower tomorrow - Do not submerge for 7 days - Routine line care - plan to proceed now with image guided gastrostomy, same sedation   Signed,  Dulcy Fanny. Earleen Newport, DO

## 2020-06-26 NOTE — Consult Note (Signed)
Chief Complaint: Patient was seen in consultation today for Port-A-Cath and gastrostomy tube placements  Referring Physician(s): Dorsey,John T IV  Supervising Physician: Corrie Mckusick  Patient Status: [facility] - Out-pt  History of Present Illness: Patrick Nelson is a 69 y.o. 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  Past Medical History:  Diagnosis Date  . Hyperlipidemia   . Paroxysmal atrial fibrillation [facility])     Past Surgical History:  Procedure Laterality Date  . DIRECT LARYNGOSCOPY N/A 05/22/2020   Procedure: DIRECT LARYNGOSCOPY w/BIOPSY TONGUE BASE;  Surgeon: Izora Gala, MD;  Location: [place];  Service: ENT;  Laterality: N/A;  . TOTAL HIP ARTHROPLASTY  2012   right hip-Dr. Berenice Primas    Allergies: Patient has no known allergies.  Medications: Prior to Admission medications   Medication Sig Start Date End Date Taking? Authorizing Provider  acetaminophen (TYLENOL) 325 MG tablet Take 650 mg by mouth every 6 (six) hours as needed.    [provider]  amoxicillin (AMOXIL) 500 MG capsule Take four capsules one hour before dental appointment. 06/12/20   Lenn Cal, DDS  aspirin 81 MG chewable tablet Chew by mouth daily.    [provider]  atorvastatin (LIPITOR) 10 MG tablet Take 10 mg by mouth daily.    [provider]  Cholecalciferol (VITAMIN D3 SUPER STRENGTH) 50 MCG (2000 UT) TABS Take 2,000 Units by mouth.    [provider]  cyanocobalamin 1000 MCG tablet Take 1,000 mcg by mouth daily.    [provider]  folic acid (FOLVITE) 096 MCG tablet Take 400 mcg by mouth daily.    [provider]  lidocaine-prilocaine (EMLA) cream Apply 1 application topically as needed. 06/15/20   Brunetta Genera, MD  LORazepam (ATIVAN) 0.5 MG tablet Take 1 tab 30 min before  radiotherapy, prn anxiety.  Will need a driver if taking this medication. 06/16/20   Eppie Gibson, MD  melatonin 5 MG TABS Take 5 mg by mouth at bedtime.    [provider]  Misc Natural Products (CHROMIUM PICOLINATE FORTIFIED PO) Take by mouth.    [provider]  Nutritional Supplements (ANTI-INFLAMMATORY ENZYME) CAPS Take by mouth. Recover AI    [provider]  ondansetron (ZOFRAN) 8 MG tablet Take 1 tablet (8 mg total) by mouth every 8 (eight) hours as needed for nausea or vomiting. 06/15/20   Brunetta Genera, MD  prochlorperazine (COMPAZINE) 10 MG tablet Take 1 tablet (10 mg total) by mouth every 6 (six) hours as needed for nausea or vomiting. 06/15/20   Brunetta Genera, MD  sodium fluoride (PREVIDENT 5000 PLUS) 1.1 % CREA dental cream Apply to tooth brush. Brush teeth for 2 minutes. Spit out excess-DO NOT swallow. Repeat nightly. 06/12/20   Lenn Cal, DDS     Family History  Problem Relation Age of Onset  . Hypertension Mother   . Heart disease Mother   . Heart disease Brother     Social History   Socioeconomic History  . Marital status: Significant Other    Spouse name: Not on file  . Number of children: 1  . Years of education: Not on file  . Highest education level: Not on file  Occupational History  . Not on file  Tobacco Use  . Smoking status: Never Smoker  . Smokeless tobacco: Never Used  Vaping Use  .  Vaping Use: Never used  Substance and Sexual Activity  . Alcohol use: Yes  . Drug use: No  . Sexual activity: Yes  Other Topics Concern  . Not on file  Social History Narrative  . Not on file   Social Determinants of Health   Financial Resource Strain:   . Difficulty of Paying Living Expenses: Not on file  Food Insecurity:   . Worried About Charity fundraiser in the Last Year: Not on file  . Ran Out of Food in the Last Year: Not on file  Transportation Needs:   . Lack of Transportation (Medical): Not on file  .  Lack of Transportation (Non-Medical): Not on file  Physical Activity:   . Days of Exercise per Week: Not on file  . Minutes of Exercise per Session: Not on file  Stress:   . Feeling of Stress : Not on file  Social Connections:   . Frequency of Communication with Friends and Family: Not on file  . Frequency of Social Gatherings with Friends and Family: Not on file  . Attends Religious Services: Not on file  . Active Member of Clubs or Organizations: Not on file  . Attends Archivist Meetings: Not on file  . Marital Status: Not on file      Review of Systems currently denies fever, headache, chest pain, dyspnea, cough,/back pain, nausea, vomiting, bleeding or dysphagia.  Vital Signs: Blood pressure 148/98, temp 97.6, heart rate 59, respirations 16, O2 sat 100% room air   Physical Exam awake, alert.  Chest with distant breath sounds bilaterally.  Heart with slightly bradycardic but regular rhythm.  Abdomen soft, positive bowel sounds, nontender.  No lower extremity edema.  Imaging: NM PET Image Initial (PI) Skull Base To Thigh  Result Date: 06/06/2020 CLINICAL DATA:  Initial treatment strategy for oropharyngeal carcinoma. EXAM: NUCLEAR MEDICINE PET SKULL BASE TO THIGH TECHNIQUE: 9.0 mCi F-18 FDG was injected intravenously. Full-ring PET imaging was performed from the skull base to thigh after the radiotracer. CT data was obtained and used for attenuation correction and anatomic localization. Fasting blood glucose: 89 mg/dl COMPARISON:  CT scan 05/11/2020 FINDINGS: Mediastinal blood pool activity: SUV max 2.38 Liver activity: SUV max NA NECK: Focal area of hypermetabolism is noted at the right tongue base with SUV max of 12.62. This is consistent with the primary mucosal neoplasm. Right level 2 and level 3 lymph nodes are hypermetabolic with SUV max of 06.23. No contralateral neck adenopathy. Incidental CT findings: Bilateral carotid artery calcifications are noted. CHEST: No  supraclavicular or axillary adenopathy. No mediastinal or hilar lymphadenopathy. No worrisome pulmonary nodules to suggest pulmonary metastatic disease. Mild stable emphysematous changes. Moderate FDG uptake is noted in the right atrial and left atrial walls. This can be seen with atrial fibrillation. Recommend clinical correlation with cardiac history. Incidental CT findings: Tortuosity and calcification of the thoracic aorta and coronary artery calcifications are noted. ABDOMEN/PELVIS: No abnormal hypermetabolic activity within the liver, pancreas, adrenal glands, or spleen. No hypermetabolic lymph nodes in the abdomen or pelvis. Incidental CT findings: Moderate atherosclerotic calcifications involving the abdominal aorta and iliac arteries but no aneurysm. SKELETON: No significant findings. A right total hip arthroplasty is noted. Moderate bilateral SI joint degenerative changes. Incidental CT findings: none IMPRESSION: 1. Hypermetabolic right tongue base mass consistent with known neoplasm. Right level 2 and level 3 metastatic adenopathy. 2. No findings for distant metastatic disease involving the chest, abdomen or pelvis. 3. Moderate FDG uptake in the right  and left atrial walls most typically seen with atrial fibrillation. Recommend clinical correlation. Electronically Signed   By: Marijo Sanes M.D.   On: 06/06/2020 12:48    Labs:  CBC: Recent Labs    06/12/20 1347 06/26/20 1000  WBC 5.4 6.8  HGB 15.7 14.9  HCT 46.8 45.9  PLT 224 202    COAGS: Recent Labs    06/26/20 1000  INR 0.9    BMP: Recent Labs    06/12/20 1347 06/26/20 1000  NA 140 139  K 4.4 4.5  CL 106 104  CO2 26 26  GLUCOSE 99 100*  BUN 16 15  CALCIUM 9.5 8.9  CREATININE 0.87 0.89  GFRNONAA >60 >60  GFRAA >60 >60    LIVER FUNCTION TESTS: Recent Labs    06/12/20 1347 06/26/20 1000  BILITOT 0.7 0.6  AST 30 26  ALT 54* 21  ALKPHOS 85 67  PROT 6.5 6.6  ALBUMIN 3.7 3.8    TUMOR MARKERS: No results  for input(s): AFPTM, CEA, CA199, CHROMGRNA in the last 8760 hours.  Assessment and Plan: 69 y.o. 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  Details/risks of procedures, including but not limited to, internal bleeding, infection, injury to adjacent structures discussed with patient with his understanding and consent.   Thank you for this interesting consult.  I greatly enjoyed meeting Absalom Aro and look forward to participating in their care.  A copy of this report was sent to the requesting provider on this date.  Electronically Signed: D. Rowe Kaliel, PA-C 06/26/2020, 10:54 AM   I spent a total of 25 minutes  in face to face in clinical consultation, greater than 50% of which was counseling/coordinating care for Port-A-Cath and gastrostomy tube placements

## 2020-06-26 NOTE — Telephone Encounter (Signed)
Scheduled appt per 9/24 schmsg - left message for patient with appt date and time

## 2020-06-26 NOTE — Sedation Documentation (Signed)
Port a cath placement completed. Patient prepped for Gastrostomy tube placement.

## 2020-06-26 NOTE — Sedation Documentation (Signed)
Gastrostomy tube placement procedure begun. 

## 2020-06-27 ENCOUNTER — Telehealth: Payer: Self-pay | Admitting: Student

## 2020-06-27 NOTE — Progress Notes (Signed)
Pharmacist Chemotherapy Monitoring - Initial Assessment    Anticipated start date: 07/03/20  Regimen:  . Are orders appropriate based on the patient's diagnosis, regimen, and cycle? Yes . Does the plan date match the patient's scheduled date? Yes . Is the sequencing of drugs appropriate? Yes . Are the premedications appropriate for the patient's regimen? Yes . Prior Authorization for treatment is: Not Started o If applicable, is the correct biosimilar selected based on the patient's insurance? not applicable  Organ Function and Labs: Marland Kitchen Are dose adjustments needed based on the patient's renal function, hepatic function, or hematologic function? No . Are appropriate labs ordered prior to the start of patient's treatment? Yes . Other organ system assessment, if indicated: N/A . The following baseline labs, if indicated, have been ordered: cisplatin: K, Mg  Dose Assessment: . Are the drug doses appropriate? Yes  . Are the following correct: o Drug concentrations Yes o IV fluid compatible with drug Yes o Administration routes Yes o Timing of therapy Yes . If applicable, does the patient have documented access for treatment and/or plans for port-a-cath placement? yes . If applicable, have lifetime cumulative doses been properly documented and assessed? yes Lifetime Dose Tracking  No doses have been documented on this patient for the following tracked chemicals: Doxorubicin, Epirubicin, Idarubicin, Daunorubicin, Mitoxantrone, Bleomycin, Oxaliplatin, Carboplatin, Liposomal Doxorubicin  o   Toxicity Monitoring/Prevention: . The patient has the following take home antiemetics prescribed: Ondansetron and Prochlorperazine . The patient has the following take home medications prescribed: N/A . Medication allergies and previous infusion related reactions, if applicable, have been reviewed and addressed. Yes . The patient's current medication list has been assessed for drug-drug interactions with  their chemotherapy regimen. no significant drug-drug interactions were identified on review.  Order Review: . Are the treatment plan orders signed? Yes . Is the patient scheduled to see a provider prior to their treatment? Yes  I verify that I have reviewed each item in the above checklist and answered each question accordingly.   Kennith Center, Pharm.D., CPP 06/27/2020@4 :29 PM

## 2020-06-27 NOTE — Telephone Encounter (Signed)
Patient was seen by IR 06/26/20 for port-a-cath and gastrostomy tube placements. Last night's on-call IR doctor received a phone call from the patient's fiance with concerns about a low-grade fever and some chills.   I spoke with the patient's fiance Butch Penny this morning. She said the patient is no longer running a fever and that he feels much better today. He is up out of bed and has eaten breakfast. She has no further questions or concerns.   Butch Penny knows to call IR if there are any future questions/or concerns.   Soyla Dryer, [place] [phone] 06/27/2020, 9:58 AM

## 2020-06-28 ENCOUNTER — Encounter: Payer: Self-pay | Admitting: Hematology and Oncology

## 2020-06-28 ENCOUNTER — Inpatient Hospital Stay: Payer: Medicare Other | Admitting: Nutrition

## 2020-06-28 ENCOUNTER — Other Ambulatory Visit: Payer: Self-pay

## 2020-06-28 DIAGNOSIS — C01 Malignant neoplasm of base of tongue: Secondary | ICD-10-CM | POA: Diagnosis not present

## 2020-06-28 NOTE — Progress Notes (Signed)
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 Patient is followed by Dr. Isidore Moos and Dr. Lorenso Courier. Patient will receive cisplatin every 7 days with daily radiation therapy.  Past medical history includes atrial fibrillation and hyperlipidemia.  Medications include vitamin D3, vitamin B12, Folvite, Ativan, Zofran, Compazine.  Labs were reviewed.  Height: 6 feet 0 inches. Weight: 177.25 pounds on September 10. Usual body weight: 170 pounds. BMI: 24.04.  Patient is status post 4 French PEG tube. He reports pain has improved and he is flushing it daily with water without difficulty. Patient currently denies nutrition impact symptoms. He reports he is trying to drink more water.  Nutrition diagnosis: Predicted suboptimal energy intake related to tongue cancer and associated treatments as evidenced by history or presence of a condition for which research shows an increased incidence of suboptimal energy intake.  Intervention: Educated on the importance of smaller more frequent meals and snacks with adequate calories and protein for weight maintenance. Educated on strategies to improve nausea and vomiting and dry mouth. Encouraged bowel regimen. Educated on importance of increased fluids. Provided fact sheets. Questions were answered. Teach back method used. Contact information has been given.  Monitoring, evaluation, goals: Patient will tolerate adequate calories and protein to minimize weight loss.  Next visit: Monday, October 11 during infusion.  **Disclaimer: This note was dictated with voice recognition software. Similar sounding words can inadvertently be transcribed and this note may contain transcription errors which may not have been corrected upon publication of note.**

## 2020-06-28 NOTE — Progress Notes (Signed)
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 Mass biopsied at the right base of the tongue. Biopsy showed invasive moderately differentiated squamous cell carcinoma 3) 06/06/2020: PET CT scan showed hypermetabolic right tongue base mass consistent with known neoplasm. Right level 2 and level 3 metastatic adenopathy. No evidence of metastatic disease.  4) 06/09/2020: established care with Dr. Isidore Moos 5) 06/12/2020: establish care with Dr. Lorenso Courier  6) 07/03/2020: intended Week 1 of Cisplatin chemoradiation  Interval History:  Patrick Nelson 69 y.o. male with medical history significant for SCC of the base of the tongue who presents for a follow up visit. The patient's last visit was on 06/12/2020. In the interim since the last visit he has had his dental work completed and is ready to start chemoradiation on 07/03/2020.  On exam today Patrick Nelson is accompanied by his wife.  He reports that he is feeling well and just a little bit anxious about starting chemotherapy on Monday.  He notes that he is not having any worsening pain, swelling issues, weight loss, or decrease in appetite.  He has had his port placed and his PEG tube is currently in place as well.  He has been instructed on how to manage both of these.  He currently denies any fevers, chills, sweats, nausea, vomiting or  diarrhea.  Full 10 point ROS is listed below.  MEDICAL HISTORY:  Past Medical History:  Diagnosis Date  . Hyperlipidemia   . Paroxysmal atrial fibrillation ([place])     SURGICAL HISTORY: Past Surgical History:  Procedure Laterality Date  . DIRECT LARYNGOSCOPY N/A 05/22/2020   Procedure: DIRECT LARYNGOSCOPY w/BIOPSY TONGUE BASE;  Surgeon: Izora Gala, MD;  Location: [place] [facility];  Service: ENT;  Laterality: N/A;  . IR GASTROSTOMY TUBE MOD SED  06/26/2020  . IR IMAGING GUIDED PORT INSERTION  06/26/2020  . TOTAL HIP ARTHROPLASTY  2012   right hip-Dr. Berenice Primas    SOCIAL HISTORY: Social History   Socioeconomic History  . Marital status: Significant Other    Spouse name: Not on file  . Number of children: 1  . Years of education: Not on file  . Highest education level: Not on file  Occupational History  . Not on file  Tobacco Use  . Smoking status: Never Smoker  . Smokeless tobacco: Never Used  Vaping Use  . Vaping Use: Never used  Substance and Sexual Activity  . Alcohol use: Yes  . Drug use: No  . Sexual activity: Yes  Other Topics Concern  . Not on file  Social History Narrative  . Not on file   Social Determinants of Health   Financial Resource Strain:   . Difficulty of Paying Living Expenses: Not on file  Food Insecurity:   . Worried About Charity fundraiser in the Last Year: Not on file  . Ran Out of Food in the Last Year: Not on  file  Transportation Needs:   . Film/video editor (Medical): Not on file  . Lack of Transportation (Non-Medical): Not on file  Physical Activity:   . Days of Exercise per Week: Not on file  . Minutes of Exercise per Session: Not on file  Stress:   . Feeling of Stress : Not on file  Social Connections:   . Frequency of Communication with Friends and Family: Not on file  . Frequency of Social Gatherings with Friends and Family: Not on file  . Attends Religious Services: Not on file  . Active Member of Clubs or  Organizations: Not on file  . Attends Archivist Meetings: Not on file  . Marital Status: Not on file  Intimate Partner Violence:   . Fear of Current or Ex-Partner: Not on file  . Emotionally Abused: Not on file  . Physically Abused: Not on file  . Sexually Abused: Not on file    FAMILY HISTORY: Family History  Problem Relation Age of Onset  . Hypertension Mother   . Heart disease Mother   . Heart disease Brother     ALLERGIES:  has No Known Allergies.  MEDICATIONS:  Current Outpatient Medications  Medication Sig Dispense Refill  . acetaminophen (TYLENOL) 325 MG tablet Take 650 mg by mouth every 6 (six) hours as needed.    Marland Kitchen amoxicillin (AMOXIL) 500 MG capsule Take four capsules one hour before dental appointment. 4 capsule 3  . aspirin 81 MG chewable tablet Chew by mouth daily.    Marland Kitchen atorvastatin (LIPITOR) 10 MG tablet Take 10 mg by mouth daily.    . Cholecalciferol (VITAMIN D3 SUPER STRENGTH) 50 MCG (2000 UT) TABS Take 2,000 Units by mouth.    . cyanocobalamin 1000 MCG tablet Take 1,000 mcg by mouth daily.    . folic acid (FOLVITE) 035 MCG tablet Take 400 mcg by mouth daily.    Marland Kitchen lidocaine-prilocaine (EMLA) cream Apply 1 application topically as needed. 30 g 0  . LORazepam (ATIVAN) 0.5 MG tablet Take 1 tab 30 min before radiotherapy, prn anxiety.  Will need a driver if taking this medication. 20 tablet 0  . melatonin 5 MG TABS Take 5 mg by mouth at bedtime.    . Misc Natural Products (CHROMIUM PICOLINATE FORTIFIED PO) Take by mouth.    . Nutritional Supplements (ANTI-INFLAMMATORY ENZYME) CAPS Take by mouth. Recover AI    . ondansetron (ZOFRAN) 8 MG tablet Take 1 tablet (8 mg total) by mouth every 8 (eight) hours as needed for nausea or vomiting. 20 tablet 0  . prochlorperazine (COMPAZINE) 10 MG tablet Take 1 tablet (10 mg total) by mouth every 6 (six) hours as needed for nausea or vomiting. 30 tablet 0  . sodium fluoride (PREVIDENT 5000 PLUS) 1.1 % CREA dental cream  Apply to tooth brush. Brush teeth for 2 minutes. Spit out excess-DO NOT swallow. Repeat nightly. 102 g prn   No current facility-administered medications for this visit.    REVIEW OF SYSTEMS:   Constitutional: ( - ) fevers, ( - )  chills , ( - ) night sweats Eyes: ( - ) blurriness of vision, ( - ) double vision, ( - ) watery eyes Ears, nose, mouth, throat, and face: ( - ) mucositis, ( - ) sore throat Respiratory: ( - ) cough, ( - ) dyspnea, ( - ) wheezes Cardiovascular: ( - ) palpitation, ( - ) chest discomfort, ( - ) lower extremity swelling Gastrointestinal:  ( - ) nausea, ( - )  heartburn, ( - ) change in bowel habits Skin: ( - ) abnormal skin rashes Lymphatics: ( - ) new lymphadenopathy, ( - ) easy bruising Neurological: ( - ) numbness, ( - ) tingling, ( - ) new weaknesses Behavioral/Psych: ( - ) mood change, ( - ) new changes  All other systems were reviewed with the patient and are negative.  PHYSICAL EXAMINATION: ECOG PERFORMANCE STATUS: 0 - Asymptomatic  Vitals:   06/29/20 0808  BP: (!) 142/88  Pulse: (!) 56  Resp: 20  Temp: (!) 97.5 F (36.4 C)  SpO2: 100%   Filed Weights   06/29/20 0808  Weight: 174 lb 11.2 oz (79.2 kg)    GENERAL: well appearing elderly Caucasian male alert, no distress and comfortable SKIN: skin color, texture, turgor are normal, no rashes or significant lesions EYES: conjunctiva are pink and non-injected, sclera clear LUNGS: clear to auscultation and percussion with normal breathing effort HEART: regular rate & rhythm and no murmurs and no lower extremity edema Musculoskeletal: no cyanosis of digits and no clubbing  PSYCH: alert & oriented x 3, fluent speech NEURO: no focal motor/sensory deficits  LABORATORY DATA:  I have reviewed the data as listed CBC Latest Ref Rng & Units 06/26/2020 06/12/2020 12/15/2014  WBC 4.0 - 10.5 K/uL 6.8 5.4 4.8  Hemoglobin 13.0 - 17.0 g/dL 14.9 15.7 14.4  Hematocrit 39 - 52 % 45.9 46.8 42.9  Platelets 150 - 400  K/uL 202 224 138(L)    CMP Latest Ref Rng & Units 06/26/2020 06/12/2020 12/16/2014  Glucose 70 - 99 mg/dL 100(H) 99 94  BUN 8 - 23 mg/dL 15 16 12   Creatinine 0.61 - 1.24 mg/dL 0.89 0.87 0.82  Sodium 135 - 145 mmol/L 139 140 136  Potassium 3.5 - 5.1 mmol/L 4.5 4.4 3.6  Chloride 98 - 111 mmol/L 104 106 102  CO2 22 - 32 mmol/L 26 26 29   Calcium 8.9 - 10.3 mg/dL 8.9 9.5 8.4  Total Protein 6.5 - 8.1 g/dL 6.6 6.5 -  Total Bilirubin 0.3 - 1.2 mg/dL 0.6 0.7 -  Alkaline Phos 38 - 126 U/L 67 85 -  AST 15 - 41 U/L 26 30 -  ALT 0 - 44 U/L 21 54(H) -    RADIOGRAPHIC STUDIES: IR Gastrostomy Tube  Result Date: 06/26/2020 INDICATION: 69 year old male with a history of head neck cancer referred for port catheter and percutaneous gastrostomy EXAM: IMAGE GUIDED PLACEMENT PORT CATHETER IMAGE GUIDED PERCUTANEOUS GASTROSTOMY MEDICATIONS: 2 g Ancef; The antibiotic was administered within an appropriate time interval prior to skin puncture. ANESTHESIA/SEDATION: Moderate (conscious) sedation was employed during this procedure. A total of Versed 2.0 mg and Fentanyl 100 mcg was administered intravenously. Moderate Sedation Time: 25 minutes. The patient's level of consciousness and vital signs were monitored continuously by radiology nursing throughout the procedure under my direct supervision. FLUOROSCOPY TIME:  Fluoroscopy Time: 0 minutes 30 seconds (3 mGy). COMPLICATIONS: None PROCEDURE: Informed written consent was obtained from the patient after a thorough discussion of the procedural risks, benefits and alternatives. All questions were addressed. Maximal Sterile Barrier Technique was utilized including caps, mask, sterile gowns, sterile gloves, sterile drape, hand hygiene and skin antiseptic. A timeout was performed prior to the initiation of the procedure. The procedure, risks, benefits, and alternatives were explained to the patient. Questions regarding the procedure were encouraged and answered. The patient  understands and consents to the procedure. Ultrasound survey was performed with images stored and sent to PACs. The right neck and chest was  prepped with chlorhexidine, and draped in the usual sterile fashion using maximum barrier technique (cap and mask, sterile gown, sterile gloves, large sterile sheet, hand hygiene and cutaneous antiseptic). Antibiotic prophylaxis was provided with 2.0g Ancef administered IV one hour prior to skin incision. Local anesthesia was attained by infiltration with 1% lidocaine without epinephrine. Ultrasound demonstrated patency of the right internal jugular vein, and this was documented with an image. Under real-time ultrasound guidance, this vein was accessed with a 21 gauge micropuncture needle and image documentation was performed. A small dermatotomy was made at the access site with an 11 scalpel. A 0.018" wire was advanced into the SVC and used to estimate the length of the internal catheter. The access needle exchanged for a 53F micropuncture vascular sheath. The 0.018" wire was then removed and a 0.035" wire advanced into the IVC. An appropriate location for the subcutaneous reservoir was selected below the clavicle and an incision was made through the skin and underlying soft tissues. The subcutaneous tissues were then dissected using a combination of blunt and sharp surgical technique and a pocket was formed. A single lumen power injectable portacatheter was then tunneled through the subcutaneous tissues from the pocket to the dermatotomy and the port reservoir placed within the subcutaneous pocket. The venous access site was then serially dilated and a peel away vascular sheath placed over the wire. The wire was removed and the port catheter advanced into position under fluoroscopic guidance. The catheter tip is positioned in the cavoatrial junction. This was documented with a spot image. The portacatheter was then tested and found to flush and aspirate well. The port was  flushed with saline followed by 100 units/mL heparinized saline. The pocket was then closed in two layers using first subdermal inverted interrupted absorbable sutures followed by a running subcuticular suture. The epidermis was then sealed with Dermabond. The dermatotomy at the venous access site was also seal with Dermabond. We then proceeded with percutaneous gastrostomy. The epigastrium was prepped with Betadine in a sterile fashion, and a sterile drape was applied covering the operative field. A sterile gown and sterile gloves were used for the procedure. A 5-French orogastric tube is placed under fluoroscopic guidance. Scout imaging of the abdomen confirms barium within the transverse colon. The stomach was distended with gas. Under fluoroscopic guidance, an 18 gauge needle was utilized to puncture the anterior wall of the body of the stomach. An Amplatz wire was advanced through the needle passing a T fastener into the lumen of the stomach. The T fastener was secured for gastropexy. A 9-French sheath was inserted. A snare was advanced through the 9-French sheath. A Britta Mccreedy was advanced through the orogastric tube. It was snared then pulled out the oral cavity, pulling the snare, as well. The leading edge of the gastrostomy was attached to the snare. It was then pulled down the esophagus and out the percutaneous site. It was secured in place. Contrast was injected. No complication Patient tolerated the procedure well and remained hemodynamically stable throughout. No complications encountered and no significant blood loss encountered IMPRESSION: Status post image guided port catheter placement, as well as image guided gastrostomy. Signed, Dulcy Fanny. Dellia Nims, RPVI [facility] Radiology Electronically Signed   By: Corrie Mckusick D.O.   On: 06/26/2020 15:39   NM PET Image Initial (PI) Skull Base To Thigh  Result Date: 06/06/2020 CLINICAL DATA:  Initial treatment  strategy for oropharyngeal carcinoma. EXAM: NUCLEAR MEDICINE PET SKULL BASE TO THIGH TECHNIQUE:  9.0 mCi F-18 FDG was injected intravenously. Full-ring PET imaging was performed from the skull base to thigh after the radiotracer. CT data was obtained and used for attenuation correction and anatomic localization. Fasting blood glucose: 89 mg/dl COMPARISON:  CT scan 05/11/2020 FINDINGS: Mediastinal blood pool activity: SUV max 2.38 Liver activity: SUV max NA NECK: Focal area of hypermetabolism is noted at the right tongue base with SUV max of 12.62. This is consistent with the primary mucosal neoplasm. Right level 2 and level 3 lymph nodes are hypermetabolic with SUV max of 16.10. No contralateral neck adenopathy. Incidental CT findings: Bilateral carotid artery calcifications are noted. CHEST: No supraclavicular or axillary adenopathy. No mediastinal or hilar lymphadenopathy. No worrisome pulmonary nodules to suggest pulmonary metastatic disease. Mild stable emphysematous changes. Moderate FDG uptake is noted in the right atrial and left atrial walls. This can be seen with atrial fibrillation. Recommend clinical correlation with cardiac history. Incidental CT findings: Tortuosity and calcification of the thoracic aorta and coronary artery calcifications are noted. ABDOMEN/PELVIS: No abnormal hypermetabolic activity within the liver, pancreas, adrenal glands, or spleen. No hypermetabolic lymph nodes in the abdomen or pelvis. Incidental CT findings: Moderate atherosclerotic calcifications involving the abdominal aorta and iliac arteries but no aneurysm. SKELETON: No significant findings. A right total hip arthroplasty is noted. Moderate bilateral SI joint degenerative changes. Incidental CT findings: none IMPRESSION: 1. Hypermetabolic right tongue base mass consistent with known neoplasm. Right level 2 and level 3 metastatic adenopathy. 2. No findings for distant metastatic disease involving the chest, abdomen or  pelvis. 3. Moderate FDG uptake in the right and left atrial walls most typically seen with atrial fibrillation. Recommend clinical correlation. Electronically Signed   By: Marijo Sanes M.D.   On: 06/06/2020 12:48   IR IMAGING GUIDED PORT INSERTION  Result Date: 06/26/2020 INDICATION: 69 year old male with a history of head neck cancer referred for port catheter and percutaneous gastrostomy EXAM: IMAGE GUIDED PLACEMENT PORT CATHETER IMAGE GUIDED PERCUTANEOUS GASTROSTOMY MEDICATIONS: 2 g Ancef; The antibiotic was administered within an appropriate time interval prior to skin puncture. ANESTHESIA/SEDATION: Moderate (conscious) sedation was employed during this procedure. A total of Versed 2.0 mg and Fentanyl 100 mcg was administered intravenously. Moderate Sedation Time: 25 minutes. The patient's level of consciousness and vital signs were monitored continuously by radiology nursing throughout the procedure under my direct supervision. FLUOROSCOPY TIME:  Fluoroscopy Time: 0 minutes 30 seconds (3 mGy). COMPLICATIONS: None PROCEDURE: Informed written consent was obtained from the patient after a thorough discussion of the procedural risks, benefits and alternatives. All questions were addressed. Maximal Sterile Barrier Technique was utilized including caps, mask, sterile gowns, sterile gloves, sterile drape, hand hygiene and skin antiseptic. A timeout was performed prior to the initiation of the procedure. The procedure, risks, benefits, and alternatives were explained to the patient. Questions regarding the procedure were encouraged and answered. The patient understands and consents to the procedure. Ultrasound survey was performed with images stored and sent to PACs. The right neck and chest was prepped with chlorhexidine, and draped in the usual sterile fashion using maximum barrier technique (cap and mask, sterile gown, sterile gloves, large sterile sheet, hand hygiene and cutaneous antiseptic). Antibiotic  prophylaxis was provided with 2.0g Ancef administered IV one hour prior to skin incision. Local anesthesia was attained by infiltration with 1% lidocaine without epinephrine. Ultrasound demonstrated patency of the right internal jugular vein, and this was documented with an image. Under real-time ultrasound guidance, this vein was accessed with a 21 gauge  micropuncture needle and image documentation was performed. A small dermatotomy was made at the access site with an 11 scalpel. A 0.018" wire was advanced into the SVC and used to estimate the length of the internal catheter. The access needle exchanged for a 62F micropuncture vascular sheath. The 0.018" wire was then removed and a 0.035" wire advanced into the IVC. An appropriate location for the subcutaneous reservoir was selected below the clavicle and an incision was made through the skin and underlying soft tissues. The subcutaneous tissues were then dissected using a combination of blunt and sharp surgical technique and a pocket was formed. A single lumen power injectable portacatheter was then tunneled through the subcutaneous tissues from the pocket to the dermatotomy and the port reservoir placed within the subcutaneous pocket. The venous access site was then serially dilated and a peel away vascular sheath placed over the wire. The wire was removed and the port catheter advanced into position under fluoroscopic guidance. The catheter tip is positioned in the cavoatrial junction. This was documented with a spot image. The portacatheter was then tested and found to flush and aspirate well. The port was flushed with saline followed by 100 units/mL heparinized saline. The pocket was then closed in two layers using first subdermal inverted interrupted absorbable sutures followed by a running subcuticular suture. The epidermis was then sealed with Dermabond. The dermatotomy at the venous access site was also seal with Dermabond. We then proceeded with  percutaneous gastrostomy. The epigastrium was prepped with Betadine in a sterile fashion, and a sterile drape was applied covering the operative field. A sterile gown and sterile gloves were used for the procedure. A 5-French orogastric tube is placed under fluoroscopic guidance. Scout imaging of the abdomen confirms barium within the transverse colon. The stomach was distended with gas. Under fluoroscopic guidance, an 18 gauge needle was utilized to puncture the anterior wall of the body of the stomach. An Amplatz wire was advanced through the needle passing a T fastener into the lumen of the stomach. The T fastener was secured for gastropexy. A 9-French sheath was inserted. A snare was advanced through the 9-French sheath. A Britta Mccreedy was advanced through the orogastric tube. It was snared then pulled out the oral cavity, pulling the snare, as well. The leading edge of the gastrostomy was attached to the snare. It was then pulled down the esophagus and out the percutaneous site. It was secured in place. Contrast was injected. No complication Patient tolerated the procedure well and remained hemodynamically stable throughout. No complications encountered and no significant blood loss encountered IMPRESSION: Status post image guided port catheter placement, as well as image guided gastrostomy. Signed, Dulcy Fanny. Dellia Nims, RPVI [facility]iology [facility] Radiology Electronically Signed   By: Corrie Mckusick D.O.   On: 06/26/2020 15:39    ASSESSMENT & PLAN Patrick Nelson 69 y.o. male with medical history significant for SCC of the base of the tongue who presents for a follow up visit.  Review the labs, review the records, discussion with the patient the findings are most consistent with a squamous cell carcinoma of the back base of the tongue.  This was a T1 N1 M0 p16 positive cancer which makes it stage I.  Overall the prognosis for this is quite well and there is an excellent chance  of durable remission after chemoradiation therapy with cisplatin.  I expressed this to the patient today and he voiced his understanding.  Today we focused on the discussion  of the treatment moving forward and the expected side effects and symptoms.  Side effects include but are not limited to fatigue, anemia, kidney dysfunction, nausea, vomiting, diarrhea, throat pain, weight loss, and neuropathy.  Patient his wife were agreeable to proceeding with treatment.  We also discussed the medications to help with potential side effects including Zofran as needed, Compazine as needed, and EMLA cream.  He is not currently in pain and therefore we have not yet started pain medication but we will begin this once he begins to experience throat pain.  The treatment of choice for this patient will be radiation with concurrent cisplatin 40 mg per metered squared weekly x7 doses.  We will plan to start this on 07/03/2020 and have weekly visits with the patient while he is receiving this treatment.  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 P16+, Stage I  --Patrick Nelson to proceed with chemoradiation starting Monday 07/03/2020. Plan for 7 weekly doses of cisplatin 40mg /m2 to be administered with radiation --supportive care as noted below. --plan for weekly visits in clinic once patient starts treatment.  --weekly CMP, CBC, and TSH --plan to see patient with Week 2 of cisplatin on 07/10/2020.   #Supportive Care --patient provided with zofran 8mg  q8H PRN and compazine 10mg  q6H PRN.  --EMLA cream for port provided --PEG and Port in place. Appreciate assistance from dietary regarding tube feeds --patient not currently in pain, will prescribe medication when symptoms begin.   No orders of the defined types were placed in this encounter.   All questions were answered. The patient knows to call the clinic with any problems, questions or concerns.  A total of more than 30 minutes were spent on this  encounter and over half of that time was spent on counseling and coordination of care as outlined above.   Ledell Peoples, MD Department of Hematology/Oncology [place] at [facility] Phone: [phone] Pager: [phone] Email: [email or web address]  06/30/2020 12:03 PM

## 2020-06-28 NOTE — Progress Notes (Signed)
Met with patient at registration to introduce myself as Financial Resource Specialist and to offer available resources.  Discussed one-time $1000 [employer] grant and qualifications to assist with personal expenses while going through treatment.  Gave him my card if interested in applying and for any additional financial questions or concerns.  

## 2020-06-29 ENCOUNTER — Inpatient Hospital Stay: Payer: Medicare Other

## 2020-06-29 ENCOUNTER — Other Ambulatory Visit: Payer: Self-pay

## 2020-06-29 ENCOUNTER — Inpatient Hospital Stay (HOSPITAL_BASED_OUTPATIENT_CLINIC_OR_DEPARTMENT_OTHER): Payer: Medicare Other | Admitting: Hematology and Oncology

## 2020-06-29 ENCOUNTER — Telehealth: Payer: Self-pay | Admitting: Hematology and Oncology

## 2020-06-29 VITALS — BP 142/88 | HR 56 | Temp 97.5°F | Resp 20 | Ht 72.0 in | Wt 174.7 lb

## 2020-06-29 DIAGNOSIS — C01 Malignant neoplasm of base of tongue: Secondary | ICD-10-CM | POA: Diagnosis not present

## 2020-06-29 DIAGNOSIS — Z23 Encounter for immunization: Secondary | ICD-10-CM

## 2020-06-29 MED ORDER — INFLUENZA VAC A&B SA ADJ QUAD 0.5 ML IM PRSY
0.5000 mL | PREFILLED_SYRINGE | Freq: Once | INTRAMUSCULAR | Status: AC
  Administered 2020-06-29: 0.5 mL via INTRAMUSCULAR

## 2020-06-29 MED ORDER — INFLUENZA VAC A&B SA ADJ QUAD 0.5 ML IM PRSY
PREFILLED_SYRINGE | INTRAMUSCULAR | Status: AC
  Filled 2020-06-29: qty 0.5

## 2020-06-29 NOTE — Telephone Encounter (Signed)
Pt rescheduled patient education appt from today 9/30 to 10/1 per pt request. Pt is aware of appt time and date.

## 2020-06-29 NOTE — Patient Instructions (Signed)
Pt given Flu vaccine information

## 2020-06-30 ENCOUNTER — Other Ambulatory Visit: Payer: Self-pay

## 2020-06-30 ENCOUNTER — Inpatient Hospital Stay: Payer: Medicare Other | Attending: Hematology and Oncology

## 2020-06-30 DIAGNOSIS — Z5111 Encounter for antineoplastic chemotherapy: Secondary | ICD-10-CM | POA: Insufficient documentation

## 2020-06-30 DIAGNOSIS — Z79899 Other long term (current) drug therapy: Secondary | ICD-10-CM | POA: Insufficient documentation

## 2020-06-30 DIAGNOSIS — C01 Malignant neoplasm of base of tongue: Secondary | ICD-10-CM | POA: Insufficient documentation

## 2020-07-02 ENCOUNTER — Other Ambulatory Visit: Payer: Self-pay | Admitting: Hematology and Oncology

## 2020-07-02 DIAGNOSIS — C01 Malignant neoplasm of base of tongue: Secondary | ICD-10-CM

## 2020-07-03 ENCOUNTER — Ambulatory Visit
Admission: RE | Admit: 2020-07-03 | Discharge: 2020-07-03 | Disposition: A | Discharge status: Home / Self Care | Payer: Medicare Other | Source: Ambulatory Visit | Attending: Radiation Oncology | Admitting: Radiation Oncology

## 2020-07-03 ENCOUNTER — Other Ambulatory Visit: Payer: Self-pay

## 2020-07-03 ENCOUNTER — Inpatient Hospital Stay: Payer: Medicare Other

## 2020-07-03 VITALS — BP 134/87 | HR 59 | Temp 98.0°F | Resp 18

## 2020-07-03 DIAGNOSIS — Z79899 Other long term (current) drug therapy: Secondary | ICD-10-CM | POA: Diagnosis not present

## 2020-07-03 DIAGNOSIS — C01 Malignant neoplasm of base of tongue: Secondary | ICD-10-CM

## 2020-07-03 DIAGNOSIS — Z5111 Encounter for antineoplastic chemotherapy: Secondary | ICD-10-CM | POA: Diagnosis not present

## 2020-07-03 LAB — CBC WITH DIFFERENTIAL (CANCER CENTER ONLY)
Abs Immature Granulocytes: 0.03 10*3/uL (ref 0.00–0.07)
Basophils Absolute: 0 10*3/uL (ref 0.0–0.1)
Basophils Relative: 1 %
Eosinophils Absolute: 0.1 10*3/uL (ref 0.0–0.5)
Eosinophils Relative: 2 %
HCT: 44.1 % (ref 39.0–52.0)
Hemoglobin: 14.9 g/dL (ref 13.0–17.0)
Immature Granulocytes: 1 %
Lymphocytes Relative: 36 %
Lymphs Abs: 1.7 10*3/uL (ref 0.7–4.0)
MCH: 30.3 pg (ref 26.0–34.0)
MCHC: 33.8 g/dL (ref 30.0–36.0)
MCV: 89.6 fL (ref 80.0–100.0)
Monocytes Absolute: 0.6 10*3/uL (ref 0.1–1.0)
Monocytes Relative: 13 %
Neutro Abs: 2.2 10*3/uL (ref 1.7–7.7)
Neutrophils Relative %: 47 %
Platelet Count: 237 10*3/uL (ref 150–400)
RBC: 4.92 MIL/uL (ref 4.22–5.81)
RDW: 12.5 % (ref 11.5–15.5)
WBC Count: 4.6 10*3/uL (ref 4.0–10.5)
nRBC: 0 % (ref 0.0–0.2)

## 2020-07-03 LAB — CMP (CANCER CENTER ONLY)
ALT: 25 U/L (ref 0–44)
AST: 22 U/L (ref 15–41)
Albumin: 3.5 g/dL (ref 3.5–5.0)
Alkaline Phosphatase: 81 U/L (ref 38–126)
Anion gap: 6 (ref 5–15)
BUN: 13 mg/dL (ref 8–23)
CO2: 26 mmol/L (ref 22–32)
Calcium: 9.5 mg/dL (ref 8.9–10.3)
Chloride: 104 mmol/L (ref 98–111)
Creatinine: 0.8 mg/dL (ref 0.61–1.24)
GFR, Est AFR Am: >60 mL/min (ref >60)
GFR, Estimated: >60 mL/min (ref >60)
Glucose, Bld: 94 mg/dL (ref 70–99)
Potassium: 4.1 mmol/L (ref 3.5–5.1)
Sodium: 136 mmol/L (ref 135–145)
Total Bilirubin: 0.5 mg/dL (ref 0.3–1.2)
Total Protein: 6.6 g/dL (ref 6.5–8.1)

## 2020-07-03 LAB — MAGNESIUM: Magnesium: 1.9 mg/dL (ref 1.7–2.4)

## 2020-07-03 MED ORDER — HEPARIN SOD (PORK) LOCK FLUSH 100 UNIT/ML IV SOLN
500.0000 [IU] | Freq: Once | INTRAVENOUS | Status: AC | PRN
  Administered 2020-07-03: 500 [IU]
  Filled 2020-07-03: qty 5

## 2020-07-03 MED ORDER — SODIUM CHLORIDE 0.9 % IV SOLN
40.0000 mg/m2 | Freq: Once | INTRAVENOUS | Status: AC
  Administered 2020-07-03: 81 mg via INTRAVENOUS
  Filled 2020-07-03: qty 81

## 2020-07-03 MED ORDER — SODIUM CHLORIDE 0.9 % IV SOLN
150.0000 mg | Freq: Once | INTRAVENOUS | Status: AC
  Administered 2020-07-03: 150 mg via INTRAVENOUS
  Filled 2020-07-03: qty 150

## 2020-07-03 MED ORDER — SODIUM CHLORIDE 0.9 % IV SOLN
Freq: Once | INTRAVENOUS | Status: AC
  Filled 2020-07-03: qty 250

## 2020-07-03 MED ORDER — SODIUM CHLORIDE 0.9 % IV SOLN
10.0000 mg | Freq: Once | INTRAVENOUS | Status: AC
  Administered 2020-07-03: 10 mg via INTRAVENOUS
  Filled 2020-07-03: qty 10

## 2020-07-03 MED ORDER — PALONOSETRON HCL INJECTION 0.25 MG/5ML
0.2500 mg | Freq: Once | INTRAVENOUS | Status: AC
  Administered 2020-07-03: 0.25 mg via INTRAVENOUS

## 2020-07-03 MED ORDER — SONAFINE EX EMUL
1.0000 "application " | Freq: Two times a day (BID) | CUTANEOUS | Status: DC
  Administered 2020-07-03: 1 via TOPICAL

## 2020-07-03 MED ORDER — SODIUM CHLORIDE 0.9 % IV SOLN
Freq: Once | INTRAVENOUS | Status: AC
  Filled 2020-07-03: qty 10

## 2020-07-03 MED ORDER — PALONOSETRON HCL INJECTION 0.25 MG/5ML
INTRAVENOUS | Status: AC
  Filled 2020-07-03: qty 5

## 2020-07-03 MED ORDER — SODIUM CHLORIDE 0.9% FLUSH
10.0000 mL | INTRAVENOUS | Status: DC | PRN
  Administered 2020-07-03: 10 mL
  Filled 2020-07-03: qty 10

## 2020-07-03 NOTE — Patient Instructions (Signed)
[facility] Discharge Instructions for Patients Receiving Chemotherapy  Today you received the following chemotherapy agents Cisplatin  To help prevent nausea and vomiting after your treatment, we encourage you to take your nausea medication as prescribed.   If you develop nausea and vomiting that is not controlled by your nausea medication, call the clinic.   BELOW ARE SYMPTOMS THAT SHOULD BE REPORTED IMMEDIATELY:  *FEVER GREATER THAN 100.5 F  *CHILLS WITH OR WITHOUT FEVER  NAUSEA AND VOMITING THAT IS NOT CONTROLLED WITH YOUR NAUSEA MEDICATION  *UNUSUAL SHORTNESS OF BREATH  *UNUSUAL BRUISING OR BLEEDING  TENDERNESS IN MOUTH AND THROAT WITH OR WITHOUT PRESENCE OF ULCERS  *URINARY PROBLEMS  *BOWEL PROBLEMS  UNUSUAL RASH Items with * indicate a potential emergency and should be followed up as soon as possible.  Feel free to call the clinic should you have any questions or concerns. The clinic phone number is [phone].  Please show the [place] at check-in to the Emergency Department and triage nurse.  Cisplatin injection What is this medicine? CISPLATIN (SIS pla tin) is a chemotherapy drug. It targets fast dividing cells, like cancer cells, and causes these cells to die. This medicine is used to treat many types of cancer like bladder, ovarian, and testicular cancers. This medicine may be used for other purposes; ask your health care provider or pharmacist if you have questions. COMMON BRAND NAME(S): Platinol, Platinol -AQ What should I tell my health care provider before I take this medicine? They need to know if you have any of these conditions:  eye disease, vision problems  hearing problems  kidney disease  low blood counts, like white cells, platelets, or red blood cells  tingling of the fingers or toes, or other nerve disorder  an unusual or allergic reaction to cisplatin, carboplatin, oxaliplatin, other medicines, foods, dyes, or  preservatives  pregnant or trying to get pregnant  breast-feeding How should I use this medicine? This drug is given as an infusion into a vein. It is administered in a hospital or clinic by a specially trained health care professional. Talk to your pediatrician regarding the use of this medicine in children. Special care may be needed. Overdosage: If you think you have taken too much of this medicine contact a poison control center or emergency room at once. NOTE: This medicine is only for you. Do not share this medicine with others. What if I miss a dose? It is important not to miss a dose. Call your doctor or health care professional if you are unable to keep an appointment. What may interact with this medicine? This medicine may interact with the following medications:  foscarnet  certain antibiotics like amikacin, gentamicin, neomycin, polymyxin B, streptomycin, tobramycin, vancomycin This list may not describe all possible interactions. Give your health care provider a list of all the medicines, herbs, non-prescription drugs, or dietary supplements you use. Also tell them if you smoke, drink alcohol, or use illegal drugs. Some items may interact with your medicine. What should I watch for while using this medicine? Your condition will be monitored carefully while you are receiving this medicine. You will need important blood work done while you are taking this medicine. This drug may make you feel generally unwell. This is not uncommon, as chemotherapy can affect healthy cells as well as cancer cells. Report any side effects. Continue your course of treatment even though you feel ill unless your doctor tells you to stop. This medicine may increase your  risk of getting an infection. Call your healthcare professional for advice if you get a fever, chills, or sore throat, or other symptoms of a cold or flu. Do not treat yourself. Try to avoid being around people who are sick. Avoid taking  medicines that contain aspirin, acetaminophen, ibuprofen, naproxen, or ketoprofen unless instructed by your healthcare professional. These medicines may hide a fever. This medicine may increase your risk to bruise or bleed. Call your doctor or health care professional if you notice any unusual bleeding. Be careful brushing and flossing your teeth or using a toothpick because you may get an infection or bleed more easily. If you have any dental work done, tell your dentist you are receiving this medicine. Do not become pregnant while taking this medicine or for 14 months after stopping it. Women should inform their healthcare professional if they wish to become pregnant or think they might be pregnant. Men should not father a child while taking this medicine and for 11 months after stopping it. There is potential for serious side effects to an unborn child. Talk to your healthcare professional for more information. Do not breast-feed an infant while taking this medicine. This medicine has caused ovarian failure in some women. This medicine may make it more difficult to get pregnant. Talk to your healthcare professional if you are concerned about your fertility. This medicine has caused decreased sperm counts in some men. This may make it more difficult to father a child. Talk to your healthcare professional if you are concerned about your fertility. Drink fluids as directed while you are taking this medicine. This will help protect your kidneys. Call your doctor or health care professional if you get diarrhea. Do not treat yourself. What side effects may I notice from receiving this medicine? Side effects that you should report to your doctor or health care professional as soon as possible:  allergic reactions like skin rash, itching or hives, swelling of the face, lips, or tongue  blurred vision  changes in vision  decreased hearing or ringing of the ears  nausea, vomiting  pain, redness, or  irritation at site where injected  pain, tingling, numbness in the hands or feet  signs and symptoms of bleeding such as bloody or black, tarry stools; red or dark brown urine; spitting up blood or brown material that looks like coffee grounds; red spots on the skin; unusual bruising or bleeding from the eyes, gums, or nose  signs and symptoms of infection like fever; chills; cough; sore throat; pain or trouble passing urine  signs and symptoms of kidney injury like trouble passing urine or change in the amount of urine  signs and symptoms of low red blood cells or anemia such as unusually weak or tired; feeling faint or lightheaded; falls; breathing problems Side effects that usually do not require medical attention (report to your doctor or health care professional if they continue or are bothersome):  loss of appetite  mouth sores  muscle cramps This list may not describe all possible side effects. Call your doctor for medical advice about side effects. You may report side effects to FDA at 1-800-FDA-1088. Where should I keep my medicine? This drug is given in a hospital or clinic and will not be stored at home. NOTE: This sheet is a summary. It may not cover all possible information. If you have questions about this medicine, talk to your doctor, pharmacist, or health care provider.  2020 Elsevier/Gold Standard (2018-09-11 15:59:17)

## 2020-07-03 NOTE — Progress Notes (Signed)

## 2020-07-03 NOTE — Progress Notes (Signed)
Oncology Nurse Navigator Documentation  To provide support, encouragement and care continuity, met with Patrick Nelson for his initial RT.  He also received his first chemotherapy infusion today.   I reviewed the 2-step treatment process, answered questions.   [name]  completed treatment without difficulty, denied questions/concerns.  I reviewed the registration/arrival procedure for subsequent treatments.  I escorted him to his first undertreat appointment with Dr. Isidore Moos. I discussed his appointment on Thursday with [name] PT  I encouraged him to call me with questions/concerns as tmts proceed.   Harlow Asa RN, BSN, OCN Head & Neck Oncology Nurse [place] at [facility] Phone # [phone]  Fax # [phone]

## 2020-07-04 ENCOUNTER — Ambulatory Visit
Admission: RE | Admit: 2020-07-04 | Discharge: 2020-07-04 | Disposition: A | Discharge status: Home / Self Care | Payer: Medicare Other | Source: Ambulatory Visit | Attending: Radiation Oncology | Admitting: Radiation Oncology

## 2020-07-04 ENCOUNTER — Telehealth: Payer: Self-pay | Admitting: *Deleted

## 2020-07-04 DIAGNOSIS — C01 Malignant neoplasm of base of tongue: Secondary | ICD-10-CM | POA: Diagnosis not present

## 2020-07-05 ENCOUNTER — Ambulatory Visit
Admission: RE | Admit: 2020-07-05 | Discharge: 2020-07-05 | Disposition: A | Discharge status: Home / Self Care | Payer: Medicare Other | Source: Ambulatory Visit | Attending: Radiation Oncology | Admitting: Radiation Oncology

## 2020-07-05 DIAGNOSIS — C01 Malignant neoplasm of base of tongue: Secondary | ICD-10-CM | POA: Diagnosis not present

## 2020-07-06 ENCOUNTER — Encounter: Payer: Self-pay | Admitting: Physical Therapy

## 2020-07-06 ENCOUNTER — Ambulatory Visit: Payer: Medicare Other | Attending: Radiation Oncology | Admitting: Physical Therapy

## 2020-07-06 ENCOUNTER — Ambulatory Visit
Admission: RE | Admit: 2020-07-06 | Discharge: 2020-07-06 | Disposition: A | Discharge status: Home / Self Care | Payer: Medicare Other | Source: Ambulatory Visit | Attending: Radiation Oncology | Admitting: Radiation Oncology

## 2020-07-06 ENCOUNTER — Other Ambulatory Visit: Payer: Self-pay

## 2020-07-06 DIAGNOSIS — R293 Abnormal posture: Secondary | ICD-10-CM | POA: Diagnosis not present

## 2020-07-06 DIAGNOSIS — R1310 Dysphagia, unspecified: Secondary | ICD-10-CM | POA: Diagnosis present

## 2020-07-06 DIAGNOSIS — C01 Malignant neoplasm of base of tongue: Secondary | ICD-10-CM

## 2020-07-06 NOTE — Progress Notes (Signed)
Oncology Nurse Navigator Documentation  I met with Mr. Sisney briefly before head and neck MDC today. He will meet with Blaire Breedlove PT today and will meet with Carl Schinke SLP in 2 weeks. He is tolerating radiation and chemotherapy well at this time. He knows to call me if he has any concerns or needs in the future.   Jennifer Malmfelt RN, BSN, OCN Head & Neck Oncology Nurse Navigator  [facility] Phone # [phone]  Fax # [phone] 

## 2020-07-06 NOTE — Therapy (Signed)
[place], [place], [address] Phone: [phone]   Fax:  [phone]  Physical Therapy Evaluation  Patient Details  Name: Patrick Nelson MRN: [id number] Date of Birth: [birth year] Referring Provider (PT): Reita May Date: 07/06/2020   PT End of Session - 07/06/20 0855    Visit Number 1    Number of Visits 2    Date for PT Re-Evaluation 08/31/20    PT Start Time 0823    PT Stop Time 0848    PT Time Calculation (min) 25 min    Activity Tolerance Patient tolerated treatment well    Behavior During Therapy [facility] for tasks assessed/performed           Past Medical History:  Diagnosis Date  . Hyperlipidemia   . Paroxysmal atrial fibrillation [facility])     Past Surgical History:  Procedure Laterality Date  . DIRECT LARYNGOSCOPY N/A 05/22/2020   Procedure: DIRECT LARYNGOSCOPY w/BIOPSY TONGUE BASE;  Surgeon: Izora Gala, MD;  Location: [facility];  Service: ENT;  Laterality: N/A;  . IR GASTROSTOMY TUBE MOD SED  06/26/2020  . IR IMAGING GUIDED PORT INSERTION  06/26/2020  . TOTAL HIP ARTHROPLASTY  2012   right hip-Dr. Berenice Primas    There were no vitals filed for this visit.    Subjective Assessment - 07/06/20 0852    Subjective I am not having any pain today.    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 No/denies    Pain Score 0-No pain              [facility] PT Assessment - 07/06/20 0001      Assessment   Medical Diagnosis base of tongue cancer    Referring Provider (PT) [name]    Onset Date/Surgical Date 05/22/20    Hand Dominance Right    Prior Therapy none      Precautions   Precautions  Other (comment)    Precaution Comments active cancer      Restrictions   Weight Bearing Restrictions No      Balance Screen   Has the patient fallen in the past 6 months No    Has the patient had a decrease in activity level because of a fear of falling?  No    Is the patient reluctant to leave their home because of a fear of falling?  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 [place]    Leisure exercises daily - walks and does [employer]      Cognition   Overall Cognitive Status Within Functional Limits for tasks assessed      Functional Tests   Functional tests Sit to Stand      Sit to Stand  Comments 30 sec sit to stand: 11 reps - 12 is avg for his age      Posture/Postural Control   Posture/Postural Control Postural limitations    Postural Limitations Rounded Shoulders;Forward head      ROM / Strength   AROM / PROM / Strength AROM      AROM   Overall AROM  Within functional limits for tasks performed    Overall AROM Comments shoulder ROM WFL    Cervical Flexion WFL    Cervical Extension WFL    Cervical - Right Side Bend WFL    Cervical - Left Side Bend WFL    Cervical - Right Rotation 25% limited    Cervical - Left Rotation 25% limited             LYMPHEDEMA/ONCOLOGY QUESTIONNAIRE - 07/06/20 0001      Lymphedema Assessments   Lymphedema Assessments Head and Neck      Head and Neck   4 cm superior to sternal notch around neck 38.2 cm    6 cm superior to sternal notch around neck 36.5 cm    8 cm superior to sternal notch around neck 37 cm                    Objective measurements completed on examination: See above findings.               PT Education - 07/06/20 0855    Education Details Neck ROM, importance of posture when sitting, standing and lying down, deep breathing, walking program and importance of staying active throughout treatment, CURE article on staying active, "Why exercise?" flyer, lymphedema and PT info    Person(s) Educated Patient    Methods Explanation;Handout    Comprehension Verbalized understanding;Returned demonstration               PT Long Term Goals - 07/06/20 0857      PT LONG TERM GOAL #1   Title Pt will return to baseline ROM measurements and will not demonstrate any signs of lymphedema to allow pt to return to PLOF.    Time 8    Period Weeks    Status New    Target Date 08/31/20              Head and Neck Clinic Goals - 07/06/20 0856      Patient will be able to verbalize understanding of a home exercise program for cervical range of motion, posture, and walking.    Time 1    Period Days    Status Achieved      Patient will be able to verbalize understanding of proper sitting and standing posture.    Time 1    Period Days    Status Achieved      Patient will be able to verbalize understanding of lymphedema risk and availability of treatment for this condition.    Time 1    Period Days    Status Achieved              Plan - 07/06/20 3536    Clinical Impression Statement Pt presents to PT with newly diagnosed base of tongue cancer. He is undergoing chemo and radiation to bilateral neck. He is very active currently and walks daily for at least an hour. His shoulder ROM is WFL and is neck ROM is only slightly limited in direction of bilateral rotation.Educated pt about signs and symptoms of lymphedema as well as anatomy and  physiology of lymphatic system. Educated him in importance of staying as active as possible throughout treatment to  decrease fatigue and in head and neck ROM exercises to decrease loss of ROM.    Stability/Clinical Decision Making Stable/Uncomplicated    Clinical Decision Making Low    Rehab Potential Good    PT Frequency --   eval and 1 f/u visit   PT Duration 8 weeks    PT Treatment/Interventions ADLs/Self Care Home Management;Therapeutic exercise;Patient/family education    PT Next Visit Plan reassess baseline    PT Home Exercise Plan head and neck ROM exercises    Consulted and Agree with Plan of Care Patient           Patient will benefit from skilled therapeutic intervention in order to improve the following deficits and impairments:  Postural dysfunction  Visit Diagnosis: Abnormal posture  Malignant neoplasm of base of tongue ([place])     Problem List Patient Active Problem List   Diagnosis Date Noted  . Squamous cell carcinoma of base of tongue ([place]) 06/12/2020  . Changing skin lesion 01/07/2019  . Sebaceous cyst 01/07/2019  . Syncope 12/14/2014  . CAP (community acquired pneumonia) 12/14/2014  . Right lower lobe pneumonia 12/14/2014  . Parapneumonic effusion 12/14/2014  . Paroxysmal atrial fibrillation ([place]) 01/25/2014  . Hyperlipidemia 01/25/2014    [name] [facility] 07/06/2020, 9:03 AM  [place], [place], [address] Phone: [phone]   Fax:  [phone]  Name: Patrick Nelson MRN: [id number] Date of Birth: May 11, 1951   Manus Gunning, PT 07/06/20 9:03 AM

## 2020-07-07 ENCOUNTER — Ambulatory Visit
Admission: RE | Admit: 2020-07-07 | Discharge: 2020-07-07 | Disposition: A | Discharge status: Home / Self Care | Payer: Medicare Other | Source: Ambulatory Visit | Attending: Radiation Oncology | Admitting: Radiation Oncology

## 2020-07-07 ENCOUNTER — Other Ambulatory Visit: Payer: Self-pay

## 2020-07-07 DIAGNOSIS — C01 Malignant neoplasm of base of tongue: Secondary | ICD-10-CM | POA: Diagnosis not present

## 2020-07-09 NOTE — Progress Notes (Signed)
[place] [phone]   [phone]  PROGRESS NOTE  Patient Care Team: Wenda Low, MD as PCP - General (Internal Medicine) Malmfelt, Stephani Police, RN as Oncology Nurse Navigator Eppie Gibson, MD as Consulting Physician (Radiation Oncology) Orson Slick, MD as Consulting Physician (Hematology and Oncology) Sharen Counter, CCC-SLP as Speech Language Pathologist (Speech Pathology) Wynelle Beckmann, Melodie Bouillon, PT as Physical Therapist (Physical Therapy) Beverely Pace, LCSW as Social Worker (General Practice) Karie Mainland, RD as Dietitian (Nutrition) Izora Gala, MD as Consulting Physician (Otolaryngology)  Hematological/Oncological History 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 Mass biopsied at the right base of the tongue. Biopsy showed invasive moderately differentiated squamous cell carcinoma 3) 06/06/2020: PET CT scan showed hypermetabolic right tongue base mass consistent with known neoplasm. Right level 2 and level 3 metastatic adenopathy. No evidence of metastatic disease.  4) 06/09/2020: established care with Dr. Isidore Moos 5) 06/12/2020: establish care with Dr. Lorenso Courier  6) 07/03/2020: Week 1 of Cisplatin chemoradiation 7) 07/10/2020: Week 2 of Cisplatin chemoradiation  Interval History:  Patrick Nelson 69 y.o. male with medical history significant for SCC of the base of the tongue who presents for a follow up visit. The patient's last visit was on 06/29/2020. In the interim since the last visit he has had his first week of chemoradiation.  On exam today Patrick Nelson is accompanied by his wife.  He reports that he has been "fair".  He notes that he has had a decrease in his energy levels, and  that he has not been "nauseated, but a little queezy".  He has had no episodes of vomiting.  He has been taking his Compazine predominantly, as his Zofran medication has been causing headaches.  He notes his appetite has been "so-so" though his wife notes that there is "none".  He notes that he is only using his tube currently for fluid flushes and not using it for any nutrition or extra hydration.  He has been having issues of chills in the afternoon which improve with his nausea medications and eating.  He notes he has had no issues with fevers, chills, sweats, shortness of breath, chest pain, lightheadedness, or neuropathy.  A full 10 point ROS is listed below.  MEDICAL HISTORY:  Past Medical History:  Diagnosis Date  . Hyperlipidemia   . Paroxysmal atrial fibrillation ([place])     SURGICAL HISTORY: Past Surgical History:  Procedure Laterality Date  . DIRECT LARYNGOSCOPY N/A 05/22/2020   Procedure: DIRECT LARYNGOSCOPY w/BIOPSY TONGUE BASE;  Surgeon: Izora Gala, MD;  Location: [facility];  Service: ENT;  Laterality: N/A;  . IR GASTROSTOMY TUBE MOD SED  06/26/2020  . IR IMAGING GUIDED PORT INSERTION  06/26/2020  . TOTAL HIP ARTHROPLASTY  2012   right hip-Dr. Berenice Primas    SOCIAL HISTORY: Social History   Socioeconomic History  . Marital status: Significant Other    Spouse name: Not on file  . Number of children: 1  . Years of education: Not on file  . Highest education level: Not on file  Occupational History  . Not on file  Tobacco Use  . Smoking status: Never Smoker  . Smokeless tobacco: Never Used  Vaping Use  . Vaping Use: Never used  Substance and Sexual Activity  . Alcohol use: Yes  . Drug use: No  . Sexual activity: Yes  Other Topics Concern  . Not on file  Social History Narrative  . Not on file   Social Determinants of Health   Financial Resource Strain:   . Difficulty of Paying Living Expenses: Not on file  Food Insecurity:   . Worried About Paediatric nurse in the Last Year: Not on file  . Ran Out of Food in the Last Year: Not on file  Transportation Needs:   . Lack of Transportation (Medical): Not on file  . Lack of Transportation (Non-Medical): Not on file  Physical Activity:   . Days of Exercise per Week: Not on file  . Minutes of Exercise per Session: Not on file  Stress:   . Feeling of Stress : Not on file  Social Connections:   . Frequency of Communication with Friends and Family: Not on file  . Frequency of Social Gatherings with Friends and Family: Not on file  . Attends Religious Services: Not on file  . Active Member of Clubs or Organizations: Not on file  . Attends Archivist Meetings: Not on file  . Marital Status: Not on file  Intimate Partner Violence:   . Fear of Current or Ex-Partner: Not on file  . Emotionally Abused: Not on file  . Physically Abused: Not on file  . Sexually Abused: Not on file    FAMILY HISTORY: Family History  Problem Relation Age of Onset  . Hypertension Mother   . Heart disease Mother   . Heart disease Brother     ALLERGIES:  has No Known Allergies.  MEDICATIONS:  Current Outpatient Medications  Medication Sig Dispense Refill  . acetaminophen (TYLENOL) 325 MG tablet Take 650 mg by mouth every 6 (six) hours as needed.    Marland Kitchen amoxicillin (AMOXIL) 500 MG capsule Take four capsules one hour before dental appointment. 4 capsule 3  . aspirin 81 MG chewable tablet Chew by mouth daily.    Marland Kitchen atorvastatin (LIPITOR) 10 MG tablet Take 10 mg by mouth daily.    . Cholecalciferol (VITAMIN D3 SUPER STRENGTH) 50 MCG (2000 UT) TABS Take 2,000 Units by mouth.    . cyanocobalamin 1000 MCG tablet Take 1,000 mcg by mouth daily.    . folic acid (FOLVITE) 224 MCG tablet Take 400 mcg by mouth daily.    Marland Kitchen lidocaine-prilocaine (EMLA) cream Apply 1 application topically as needed. 30 g 0  . LORazepam (ATIVAN) 0.5 MG tablet Take 1 tab 30 min before radiotherapy, prn anxiety.  Will need a driver  if taking this medication. 20 tablet 0  . melatonin 5 MG TABS Take 5 mg by mouth at bedtime.    . Misc Natural Products (CHROMIUM PICOLINATE FORTIFIED PO) Take by mouth.    . Nutritional Supplements (ANTI-INFLAMMATORY ENZYME) CAPS Take by mouth. Recover AI    . ondansetron (ZOFRAN) 8 MG tablet Take 1 tablet (8 mg total) by mouth every 8 (eight) hours as needed for nausea or vomiting. 20 tablet 0  . prochlorperazine (COMPAZINE) 10 MG tablet Take 1 tablet (10 mg total) by mouth every 6 (six) hours as needed for nausea or vomiting. 30 tablet 0  . sodium fluoride (PREVIDENT 5000 PLUS) 1.1 % CREA dental cream Apply to tooth brush. Brush teeth for 2 minutes. Spit out excess-DO NOT swallow. Repeat nightly. 102 g prn  No current facility-administered medications for this visit.    REVIEW OF SYSTEMS:   Constitutional: ( - ) fevers, ( - )  chills , ( - ) night sweats Eyes: ( - ) blurriness of vision, ( - ) double vision, ( - ) watery eyes Ears, nose, mouth, throat, and face: ( - ) mucositis, ( - ) sore throat Respiratory: ( - ) cough, ( - ) dyspnea, ( - ) wheezes Cardiovascular: ( - ) palpitation, ( - ) chest discomfort, ( - ) lower extremity swelling Gastrointestinal:  ( - ) nausea, ( - ) heartburn, ( - ) change in bowel habits Skin: ( - ) abnormal skin rashes Lymphatics: ( - ) new lymphadenopathy, ( - ) easy bruising Neurological: ( - ) numbness, ( - ) tingling, ( - ) new weaknesses Behavioral/Psych: ( - ) mood change, ( - ) new changes  All other systems were reviewed with the patient and are negative.  PHYSICAL EXAMINATION: ECOG PERFORMANCE STATUS: 0 - Asymptomatic  There were no vitals filed for this visit. There were no vitals filed for this visit.  GENERAL: well appearing elderly Caucasian male alert, no distress and comfortable SKIN: skin color, texture, turgor are normal, no rashes or significant lesions EYES: conjunctiva are pink and non-injected, sclera clear LUNGS: clear to  auscultation and percussion with normal breathing effort HEART: regular rate & rhythm and no murmurs and no lower extremity edema Musculoskeletal: no cyanosis of digits and no clubbing  PSYCH: alert & oriented x 3, fluent speech NEURO: no focal motor/sensory deficits  LABORATORY DATA:  I have reviewed the data as listed CBC Latest Ref Rng & Units 07/03/2020 06/26/2020 06/12/2020  WBC 4.0 - 10.5 K/uL 4.6 6.8 5.4  Hemoglobin 13.0 - 17.0 g/dL 14.9 14.9 15.7  Hematocrit 39 - 52 % 44.1 45.9 46.8  Platelets 150 - 400 K/uL 237 202 224    CMP Latest Ref Rng & Units 07/03/2020 06/26/2020 06/12/2020  Glucose 70 - 99 mg/dL 94 100(H) 99  BUN 8 - 23 mg/dL 13 15 16   Creatinine 0.61 - 1.24 mg/dL 0.80 0.89 0.87  Sodium 135 - 145 mmol/L 136 139 140  Potassium 3.5 - 5.1 mmol/L 4.1 4.5 4.4  Chloride 98 - 111 mmol/L 104 104 106  CO2 22 - 32 mmol/L 26 26 26   Calcium 8.9 - 10.3 mg/dL 9.5 8.9 9.5  Total Protein 6.5 - 8.1 g/dL 6.6 6.6 6.5  Total Bilirubin 0.3 - 1.2 mg/dL 0.5 0.6 0.7  Alkaline Phos 38 - 126 U/L 81 67 85  AST 15 - 41 U/L 22 26 30   ALT 0 - 44 U/L 25 21 54(H)    RADIOGRAPHIC STUDIES: IR Gastrostomy Tube  Result Date: 06/26/2020 INDICATION: 69 year old male with a history of head neck cancer referred for port catheter and percutaneous gastrostomy EXAM: IMAGE GUIDED PLACEMENT PORT CATHETER IMAGE GUIDED PERCUTANEOUS GASTROSTOMY MEDICATIONS: 2 g Ancef; The antibiotic was administered within an appropriate time interval prior to skin puncture. ANESTHESIA/SEDATION: Moderate (conscious) sedation was employed during this procedure. A total of Versed 2.0 mg and Fentanyl 100 mcg was administered intravenously. Moderate Sedation Time: 25 minutes. The patient's level of consciousness and vital signs were monitored continuously by radiology nursing throughout the procedure under my direct supervision. FLUOROSCOPY TIME:  Fluoroscopy Time: 0 minutes 30 seconds (3 mGy). COMPLICATIONS: None PROCEDURE: Informed  written consent was obtained from the patient after a thorough discussion of the procedural risks, benefits and alternatives. All questions were addressed. Maximal Sterile  Barrier Technique was utilized including caps, mask, sterile gowns, sterile gloves, sterile drape, hand hygiene and skin antiseptic. A timeout was performed prior to the initiation of the procedure. The procedure, risks, benefits, and alternatives were explained to the patient. Questions regarding the procedure were encouraged and answered. The patient understands and consents to the procedure. Ultrasound survey was performed with images stored and sent to PACs. The right neck and chest was prepped with chlorhexidine, and draped in the usual sterile fashion using maximum barrier technique (cap and mask, sterile gown, sterile gloves, large sterile sheet, hand hygiene and cutaneous antiseptic). Antibiotic prophylaxis was provided with 2.0g Ancef administered IV one hour prior to skin incision. Local anesthesia was attained by infiltration with 1% lidocaine without epinephrine. Ultrasound demonstrated patency of the right internal jugular vein, and this was documented with an image. Under real-time ultrasound guidance, this vein was accessed with a 21 gauge micropuncture needle and image documentation was performed. A small dermatotomy was made at the access site with an 11 scalpel. A 0.018" wire was advanced into the SVC and used to estimate the length of the internal catheter. The access needle exchanged for a 57F micropuncture vascular sheath. The 0.018" wire was then removed and a 0.035" wire advanced into the IVC. An appropriate location for the subcutaneous reservoir was selected below the clavicle and an incision was made through the skin and underlying soft tissues. The subcutaneous tissues were then dissected using a combination of blunt and sharp surgical technique and a pocket was formed. A single lumen power injectable portacatheter was  then tunneled through the subcutaneous tissues from the pocket to the dermatotomy and the port reservoir placed within the subcutaneous pocket. The venous access site was then serially dilated and a peel away vascular sheath placed over the wire. The wire was removed and the port catheter advanced into position under fluoroscopic guidance. The catheter tip is positioned in the cavoatrial junction. This was documented with a spot image. The portacatheter was then tested and found to flush and aspirate well. The port was flushed with saline followed by 100 units/mL heparinized saline. The pocket was then closed in two layers using first subdermal inverted interrupted absorbable sutures followed by a running subcuticular suture. The epidermis was then sealed with Dermabond. The dermatotomy at the venous access site was also seal with Dermabond. We then proceeded with percutaneous gastrostomy. The epigastrium was prepped with Betadine in a sterile fashion, and a sterile drape was applied covering the operative field. A sterile gown and sterile gloves were used for the procedure. A 5-French orogastric tube is placed under fluoroscopic guidance. Scout imaging of the abdomen confirms barium within the transverse colon. The stomach was distended with gas. Under fluoroscopic guidance, an 18 gauge needle was utilized to puncture the anterior wall of the body of the stomach. An Amplatz wire was advanced through the needle passing a T fastener into the lumen of the stomach. The T fastener was secured for gastropexy. A 9-French sheath was inserted. A snare was advanced through the 9-French sheath. A Britta Mccreedy was advanced through the orogastric tube. It was snared then pulled out the oral cavity, pulling the snare, as well. The leading edge of the gastrostomy was attached to the snare. It was then pulled down the esophagus and out the percutaneous site. It was secured in place. Contrast was injected. No complication Patient  tolerated the procedure well and remained hemodynamically stable throughout. No complications encountered and no significant blood loss encountered IMPRESSION:  Status post image guided port catheter placement, as well as image guided gastrostomy. Signed, Dulcy Fanny. Dellia Nims, RPVI [facility] Radiology Electronically Signed   By: Corrie Mckusick D.O.   On: 06/26/2020 15:39   IR IMAGING GUIDED PORT INSERTION  Result Date: 06/26/2020 INDICATION: 69 year old male with a history of head neck cancer referred for port catheter and percutaneous gastrostomy EXAM: IMAGE GUIDED PLACEMENT PORT CATHETER IMAGE GUIDED PERCUTANEOUS GASTROSTOMY MEDICATIONS: 2 g Ancef; The antibiotic was administered within an appropriate time interval prior to skin puncture. ANESTHESIA/SEDATION: Moderate (conscious) sedation was employed during this procedure. A total of Versed 2.0 mg and Fentanyl 100 mcg was administered intravenously. Moderate Sedation Time: 25 minutes. The patient's level of consciousness and vital signs were monitored continuously by radiology nursing throughout the procedure under my direct supervision. FLUOROSCOPY TIME:  Fluoroscopy Time: 0 minutes 30 seconds (3 mGy). COMPLICATIONS: None PROCEDURE: Informed written consent was obtained from the patient after a thorough discussion of the procedural risks, benefits and alternatives. All questions were addressed. Maximal Sterile Barrier Technique was utilized including caps, mask, sterile gowns, sterile gloves, sterile drape, hand hygiene and skin antiseptic. A timeout was performed prior to the initiation of the procedure. The procedure, risks, benefits, and alternatives were explained to the patient. Questions regarding the procedure were encouraged and answered. The patient understands and consents to the procedure. Ultrasound survey was performed with images stored and sent to PACs. The right neck and chest was prepped with  chlorhexidine, and draped in the usual sterile fashion using maximum barrier technique (cap and mask, sterile gown, sterile gloves, large sterile sheet, hand hygiene and cutaneous antiseptic). Antibiotic prophylaxis was provided with 2.0g Ancef administered IV one hour prior to skin incision. Local anesthesia was attained by infiltration with 1% lidocaine without epinephrine. Ultrasound demonstrated patency of the right internal jugular vein, and this was documented with an image. Under real-time ultrasound guidance, this vein was accessed with a 21 gauge micropuncture needle and image documentation was performed. A small dermatotomy was made at the access site with an 11 scalpel. A 0.018" wire was advanced into the SVC and used to estimate the length of the internal catheter. The access needle exchanged for a 75F micropuncture vascular sheath. The 0.018" wire was then removed and a 0.035" wire advanced into the IVC. An appropriate location for the subcutaneous reservoir was selected below the clavicle and an incision was made through the skin and underlying soft tissues. The subcutaneous tissues were then dissected using a combination of blunt and sharp surgical technique and a pocket was formed. A single lumen power injectable portacatheter was then tunneled through the subcutaneous tissues from the pocket to the dermatotomy and the port reservoir placed within the subcutaneous pocket. The venous access site was then serially dilated and a peel away vascular sheath placed over the wire. The wire was removed and the port catheter advanced into position under fluoroscopic guidance. The catheter tip is positioned in the cavoatrial junction. This was documented with a spot image. The portacatheter was then tested and found to flush and aspirate well. The port was flushed with saline followed by 100 units/mL heparinized saline. The pocket was then closed in two layers using first subdermal inverted interrupted  absorbable sutures followed by a running subcuticular suture. The epidermis was then sealed with Dermabond. The dermatotomy at the venous access site was also seal with Dermabond. We then proceeded with percutaneous gastrostomy. The epigastrium was prepped with Betadine in a  sterile fashion, and a sterile drape was applied covering the operative field. A sterile gown and sterile gloves were used for the procedure. A 5-French orogastric tube is placed under fluoroscopic guidance. Scout imaging of the abdomen confirms barium within the transverse colon. The stomach was distended with gas. Under fluoroscopic guidance, an 18 gauge needle was utilized to puncture the anterior wall of the body of the stomach. An Amplatz wire was advanced through the needle passing a T fastener into the lumen of the stomach. The T fastener was secured for gastropexy. A 9-French sheath was inserted. A snare was advanced through the 9-French sheath. A Britta Mccreedy was advanced through the orogastric tube. It was snared then pulled out the oral cavity, pulling the snare, as well. The leading edge of the gastrostomy was attached to the snare. It was then pulled down the esophagus and out the percutaneous site. It was secured in place. Contrast was injected. No complication Patient tolerated the procedure well and remained hemodynamically stable throughout. No complications encountered and no significant blood loss encountered IMPRESSION: Status post image guided port catheter placement, as well as image guided gastrostomy. Signed, Dulcy Fanny. Dellia Nims, RPVI [facility] Inter[facility] Radiology Specialists [facility] Radiology Electronically Signed   By: Corrie Mckusick D.O.   On: 06/26/2020 15:39    ASSESSMENT & PLAN Zimere Dunlevy 69 y.o. male with medical history significant for SCC of the base of the tongue who presents for a follow up visit.  Review the labs, review the records, discussion with the patient the findings are most consistent with  a squamous cell carcinoma of the back base of the tongue.  This was a T1 N1 M0 p16 positive cancer which makes it stage I.  Overall the prognosis for this is quite well and there is an excellent chance of durable remission after chemoradiation therapy with cisplatin.   On exam today Patrick Nelson notes tolerated week 1 of treatment quite well.  He is not having any skin changes or throat pain at this time.  He is willing and able to proceed with week 2 of chemoradiation.  Previously we discussed treatment moving forward and the expected side effects and symptoms.  Side effects include but are not limited to fatigue, anemia, kidney dysfunction, nausea, vomiting, diarrhea, throat pain, weight loss, and neuropathy.  Patient his wife were agreeable to proceeding with treatment.  We also discussed the medications to help with potential side effects including Zofran as needed, Compazine as needed, and EMLA cream.  He is not currently in pain and therefore we have not yet started pain medication but we will begin this once he begins to experience throat pain.  The treatment of choice for this patient will be radiation with concurrent cisplatin 40 mg per metered squared weekly x7 doses.  We will plan to start this on 07/03/2020 and have weekly visits with the patient while he is receiving this treatment.  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 P16+, Stage I  --OK to proceed with chemoradiation Week 2 today. Plan for 7 weekly doses of cisplatin 40mg /m2 to be administered with radiation --supportive care as noted below. --plan for weekly visits in clinic with labs --weekly CMP, CBC. Periodic TSH while receiving radiation.  --plan to see patient with Week 3 of cisplatin on 07/17/2020.   #Supportive Care --patient provided with zofran 8mg  q8H PRN and compazine 10mg  q6H PRN.  --EMLA cream for port provided --PEG and Port in place. Appreciate  assistance from dietary regarding tube  feeds --patient not currently in pain, will prescribe medication when symptoms begin.   No orders of the defined types were placed in this encounter.   All questions were answered. The patient knows to call the clinic with any problems, questions or concerns.  A total of more than 30 minutes were spent on this encounter and over half of that time was spent on counseling and coordination of care as outlined above.   Patrick Peoples, MD Department of Hematology/Oncology [place] [facility] Phone: [phone] Pager: [phone] Email: [email or web address]  07/09/2020 8:22 PM

## 2020-07-10 ENCOUNTER — Inpatient Hospital Stay (HOSPITAL_BASED_OUTPATIENT_CLINIC_OR_DEPARTMENT_OTHER): Payer: Medicare Other | Admitting: Hematology and Oncology

## 2020-07-10 ENCOUNTER — Inpatient Hospital Stay: Payer: Medicare Other | Admitting: Nutrition

## 2020-07-10 ENCOUNTER — Ambulatory Visit
Admission: RE | Admit: 2020-07-10 | Discharge: 2020-07-10 | Disposition: A | Discharge status: Home / Self Care | Payer: Medicare Other | Source: Ambulatory Visit | Attending: Radiation Oncology | Admitting: Radiation Oncology

## 2020-07-10 ENCOUNTER — Inpatient Hospital Stay: Payer: Medicare Other

## 2020-07-10 ENCOUNTER — Other Ambulatory Visit: Payer: Self-pay

## 2020-07-10 ENCOUNTER — Encounter: Payer: Self-pay | Admitting: Hematology and Oncology

## 2020-07-10 ENCOUNTER — Other Ambulatory Visit: Payer: Self-pay | Admitting: Radiation Oncology

## 2020-07-10 VITALS — BP 131/91 | HR 73 | Temp 97.8°F | Resp 18 | Ht 72.0 in | Wt 170.8 lb

## 2020-07-10 DIAGNOSIS — C01 Malignant neoplasm of base of tongue: Secondary | ICD-10-CM

## 2020-07-10 DIAGNOSIS — Z5111 Encounter for antineoplastic chemotherapy: Secondary | ICD-10-CM | POA: Diagnosis not present

## 2020-07-10 LAB — CBC WITH DIFFERENTIAL (CANCER CENTER ONLY)
Abs Immature Granulocytes: 0.12 10*3/uL — ABNORMAL HIGH (ref 0.00–0.07)
Basophils Absolute: 0 10*3/uL (ref 0.0–0.1)
Basophils Relative: 0 %
Eosinophils Absolute: 0 10*3/uL (ref 0.0–0.5)
Eosinophils Relative: 0 %
HCT: 47.8 % (ref 39.0–52.0)
Hemoglobin: 16.1 g/dL (ref 13.0–17.0)
Immature Granulocytes: 1 %
Lymphocytes Relative: 13 %
Lymphs Abs: 1.3 10*3/uL (ref 0.7–4.0)
MCH: 30.1 pg (ref 26.0–34.0)
MCHC: 33.7 g/dL (ref 30.0–36.0)
MCV: 89.3 fL (ref 80.0–100.0)
Monocytes Absolute: 0.8 10*3/uL (ref 0.1–1.0)
Monocytes Relative: 8 %
Neutro Abs: 7.7 10*3/uL (ref 1.7–7.7)
Neutrophils Relative %: 78 %
Platelet Count: 283 10*3/uL (ref 150–400)
RBC: 5.35 MIL/uL (ref 4.22–5.81)
RDW: 12.2 % (ref 11.5–15.5)
WBC Count: 10 10*3/uL (ref 4.0–10.5)
nRBC: 0 % (ref 0.0–0.2)

## 2020-07-10 LAB — CMP (CANCER CENTER ONLY)
ALT: 30 U/L (ref 0–44)
AST: 16 U/L (ref 15–41)
Albumin: 3.6 g/dL (ref 3.5–5.0)
Alkaline Phosphatase: 98 U/L (ref 38–126)
Anion gap: 3 — ABNORMAL LOW (ref 5–15)
BUN: 15 mg/dL (ref 8–23)
CO2: 31 mmol/L (ref 22–32)
Calcium: 10.2 mg/dL (ref 8.9–10.3)
Chloride: 95 mmol/L — ABNORMAL LOW (ref 98–111)
Creatinine: 1.12 mg/dL (ref 0.61–1.24)
GFR, Estimated: >60 mL/min (ref >60)
Glucose, Bld: 117 mg/dL — ABNORMAL HIGH (ref 70–99)
Potassium: 4.6 mmol/L (ref 3.5–5.1)
Sodium: 129 mmol/L — ABNORMAL LOW (ref 135–145)
Total Bilirubin: 0.9 mg/dL (ref 0.3–1.2)
Total Protein: 7.1 g/dL (ref 6.5–8.1)

## 2020-07-10 LAB — MAGNESIUM: Magnesium: 2 mg/dL (ref 1.7–2.4)

## 2020-07-10 MED ORDER — PALONOSETRON HCL INJECTION 0.25 MG/5ML
0.2500 mg | Freq: Once | INTRAVENOUS | Status: AC
  Administered 2020-07-10: 0.25 mg via INTRAVENOUS

## 2020-07-10 MED ORDER — SODIUM CHLORIDE 0.9 % IV SOLN
Freq: Once | INTRAVENOUS | Status: AC
  Filled 2020-07-10: qty 10

## 2020-07-10 MED ORDER — PALONOSETRON HCL INJECTION 0.25 MG/5ML
INTRAVENOUS | Status: AC
  Filled 2020-07-10: qty 5

## 2020-07-10 MED ORDER — SODIUM CHLORIDE 0.9 % IV SOLN
10.0000 mg | Freq: Once | INTRAVENOUS | Status: AC
  Administered 2020-07-10: 10 mg via INTRAVENOUS
  Filled 2020-07-10: qty 10

## 2020-07-10 MED ORDER — SODIUM CHLORIDE 0.9 % IV SOLN
40.0000 mg/m2 | Freq: Once | INTRAVENOUS | Status: AC
  Administered 2020-07-10: 81 mg via INTRAVENOUS
  Filled 2020-07-10: qty 81

## 2020-07-10 MED ORDER — SODIUM CHLORIDE 0.9% FLUSH
10.0000 mL | INTRAVENOUS | Status: DC | PRN
  Administered 2020-07-10: 10 mL
  Filled 2020-07-10: qty 10

## 2020-07-10 MED ORDER — SODIUM CHLORIDE 0.9 % IV SOLN
Freq: Once | INTRAVENOUS | Status: AC
  Filled 2020-07-10: qty 250

## 2020-07-10 MED ORDER — HEPARIN SOD (PORK) LOCK FLUSH 100 UNIT/ML IV SOLN
500.0000 [IU] | Freq: Once | INTRAVENOUS | Status: AC | PRN
  Administered 2020-07-10: 500 [IU]
  Filled 2020-07-10: qty 5

## 2020-07-10 MED ORDER — LIDOCAINE VISCOUS HCL 2 % MT SOLN: OROMUCOSAL | 5 refills | Status: DC

## 2020-07-10 MED ORDER — SODIUM CHLORIDE 0.9 % IV SOLN
150.0000 mg | Freq: Once | INTRAVENOUS | Status: AC
  Administered 2020-07-10: 150 mg via INTRAVENOUS
  Filled 2020-07-10: qty 150

## 2020-07-10 MED FILL — LIDOCAINE 2% VISCOUS SOLN: 2 | 10 days supply | Qty: 200 | Fill #0

## 2020-07-10 NOTE — Progress Notes (Signed)
Per Dr. Lorenso Courier - okay to proceed with treatment today with current urine output of 175 ml.

## 2020-07-10 NOTE — Patient Instructions (Signed)
[facility] Discharge Instructions for Patients Receiving Chemotherapy  Today you received the following chemotherapy agents: Cisplatin  To help prevent nausea and vomiting after your treatment, we encourage you to take your nausea medication  as prescribed.    If you develop nausea and vomiting that is not controlled by your nausea medication, call the clinic.   BELOW ARE SYMPTOMS THAT SHOULD BE REPORTED IMMEDIATELY:  *FEVER GREATER THAN 100.5 F  *CHILLS WITH OR WITHOUT FEVER  NAUSEA AND VOMITING THAT IS NOT CONTROLLED WITH YOUR NAUSEA MEDICATION  *UNUSUAL SHORTNESS OF BREATH  *UNUSUAL BRUISING OR BLEEDING  TENDERNESS IN MOUTH AND THROAT WITH OR WITHOUT PRESENCE OF ULCERS  *URINARY PROBLEMS  *BOWEL PROBLEMS  UNUSUAL RASH Items with * indicate a potential emergency and should be followed up as soon as possible.  Feel free to call the clinic should you have any questions or concerns. The clinic phone number is [phone].  Please show the CHEMO ALERT CARD at check-in to the Emergency Department and triage nurse.   

## 2020-07-10 NOTE — Progress Notes (Signed)
Nutrition follow-up completed with patient receiving cisplatin for right tongue cancer.  He is also receiving concurrent radiation therapy. Noted weight decreased to 170.8 pounds October 11.  This is down from 177 pounds September 10 but reflects his usual body weight. Noted labs: Sodium 129, glucose 117. Denies nausea but does report queasiness.  States he takes Zofran and Compazine. Denies difficulty eating and states he needs to do a "better job." States he thought he was eating enough but realizes now he does not. Continues to drink plenty of water.  Nutrition diagnosis: Predicted suboptimal energy intake has evolved into inadequate oral intake as evidenced by 7 pound weight loss over 1 month.  Intervention: Educated patient to increase calories and protein in small frequent meals and snacks. Reviewed high-calorie foods. Recommended patient add oral nutrition supplements twice daily between meals.  If unable to drink oral nutrition supplements, educated patient to begin Osmolite 1.5 - 1 carton 3 times a day between meals. Encouraged nausea medication to minimize queasiness. Continue to flush feeding tube with water and clean daily.  Monitoring, evaluation, goals: Patient will increase oral intake to minimize further weight loss.  Next visit: Monday, October 25 during infusion.  **Disclaimer: This note was dictated with voice recognition software. Similar sounding words can inadvertently be transcribed and this note may contain transcription errors which may not have been corrected upon publication of note.**

## 2020-07-11 ENCOUNTER — Ambulatory Visit
Admission: RE | Admit: 2020-07-11 | Discharge: 2020-07-11 | Disposition: A | Discharge status: Home / Self Care | Payer: Medicare Other | Source: Ambulatory Visit | Attending: Radiation Oncology | Admitting: Radiation Oncology

## 2020-07-11 DIAGNOSIS — C01 Malignant neoplasm of base of tongue: Secondary | ICD-10-CM | POA: Diagnosis not present

## 2020-07-12 ENCOUNTER — Ambulatory Visit
Admission: RE | Admit: 2020-07-12 | Discharge: 2020-07-12 | Disposition: A | Discharge status: Home / Self Care | Payer: Medicare Other | Source: Ambulatory Visit | Attending: Radiation Oncology | Admitting: Radiation Oncology

## 2020-07-12 DIAGNOSIS — C01 Malignant neoplasm of base of tongue: Secondary | ICD-10-CM | POA: Diagnosis not present

## 2020-07-13 ENCOUNTER — Ambulatory Visit
Admission: RE | Admit: 2020-07-13 | Discharge: 2020-07-13 | Disposition: A | Discharge status: Home / Self Care | Payer: Medicare Other | Source: Ambulatory Visit | Attending: Radiation Oncology | Admitting: Radiation Oncology

## 2020-07-13 DIAGNOSIS — C01 Malignant neoplasm of base of tongue: Secondary | ICD-10-CM | POA: Diagnosis not present

## 2020-07-14 ENCOUNTER — Ambulatory Visit
Admission: RE | Admit: 2020-07-14 | Discharge: 2020-07-14 | Disposition: A | Discharge status: Home / Self Care | Payer: Medicare Other | Source: Ambulatory Visit | Attending: Radiation Oncology | Admitting: Radiation Oncology

## 2020-07-14 DIAGNOSIS — C01 Malignant neoplasm of base of tongue: Secondary | ICD-10-CM | POA: Diagnosis not present

## 2020-07-16 NOTE — Progress Notes (Signed)
[place] [phone]   [phone]  PROGRESS NOTE  Patient Care Team: Wenda Low, MD as PCP - General (Internal Medicine) Malmfelt, Stephani Police, RN as Oncology Nurse Navigator Eppie Gibson, MD as Consulting Physician (Radiation Oncology) Orson Slick, MD as Consulting Physician (Hematology and Oncology) Sharen Counter, CCC-SLP as Speech Language Pathologist (Speech Pathology) Wynelle Beckmann, Melodie Bouillon, PT as Physical Therapist (Physical Therapy) Beverely Pace, LCSW as Social Worker (General Practice) Karie Mainland, RD as Dietitian (Nutrition) Izora Gala, MD as Consulting Physician (Otolaryngology)  Hematological/Oncological History 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 Mass biopsied at the right base of the tongue. Biopsy showed invasive moderately differentiated squamous cell carcinoma 3) 06/06/2020: PET CT scan showed hypermetabolic right tongue base mass consistent with known neoplasm. Right level 2 and level 3 metastatic adenopathy. No evidence of metastatic disease.  4) 06/09/2020: established care with Dr. Isidore Moos 5) 06/12/2020: establish care with Dr. Lorenso Courier  6) 07/03/2020: Week 1 of Cisplatin chemoradiation 7) 07/10/2020: Week 2 of Cisplatin chemoradiation 8) 07/17/2020: Week 3 of Cisplatin chemoradiation  Interval History:  Baldo Hufnagle 69 y.o. male with medical history significant for SCC of the base of the tongue who presents for a follow up visit. The patient's last visit was on 07/10/2020. In the interim since the last visit he has had his second week of chemoradiation.  On exam today Mr. Ledwell is accompanied by his wife.  He reports that week 2 was "tougher than the week  before".  He notes that his life is getting thicker and has been having to cough it up on occasion.  He denies having any overt pain in the back of his throat, but he does note that there is a little bit of discomfort when trying to swallow.  He rates it currently at a 2 a 10 in severity and does not require any pain medication for this.  He is continue taking all of his food and liquid 100% p.o.  He notes his bowels are moving well.  He does report that he is not eating as much as he should and has been weighing himself at home to keep track of this.  He has not had any issues with nausea or vomiting during the chemotherapy treatments and has been taking his Compazine at home to prevent further nausea.  He denies having any other issues at this time.Marland Kitchen He notes he has had no issues with fevers, chills, sweats, shortness of breath, chest pain, lightheadedness, or neuropathy.  A full 10 point ROS is listed below.  MEDICAL HISTORY:  Past Medical History:  Diagnosis Date  . Hyperlipidemia   . Paroxysmal atrial fibrillation ([place])     SURGICAL HISTORY: Past Surgical History:  Procedure Laterality Date  . DIRECT LARYNGOSCOPY N/A 05/22/2020   Procedure: DIRECT LARYNGOSCOPY w/BIOPSY TONGUE BASE;  Surgeon: Izora Gala, MD;  Location: [facility];  Service: ENT;  Laterality: N/A;  . IR GASTROSTOMY TUBE MOD SED  06/26/2020  . IR IMAGING GUIDED PORT INSERTION  06/26/2020  . TOTAL HIP ARTHROPLASTY  2012   right hip-Dr. Berenice Primas    SOCIAL HISTORY: Social History   Socioeconomic History  . Marital status:  Significant Other    Spouse name: Not on file  . Number of children: 1  . Years of education: Not on file  . Highest education level: Not on file  Occupational History  . Not on file  Tobacco Use  . Smoking status: Never Smoker  . Smokeless tobacco: Never Used  Vaping Use  . Vaping Use: Never used  Substance and Sexual Activity  . Alcohol use: Yes  . Drug use: No  . Sexual activity:  Yes  Other Topics Concern  . Not on file  Social History Narrative  . Not on file   Social Determinants of Health   Financial Resource Strain:   . Difficulty of Paying Living Expenses: Not on file  Food Insecurity:   . Worried About Charity fundraiser in the Last Year: Not on file  . Ran Out of Food in the Last Year: Not on file  Transportation Needs:   . Lack of Transportation (Medical): Not on file  . Lack of Transportation (Non-Medical): Not on file  Physical Activity:   . Days of Exercise per Week: Not on file  . Minutes of Exercise per Session: Not on file  Stress:   . Feeling of Stress : Not on file  Social Connections:   . Frequency of Communication with Friends and Family: Not on file  . Frequency of Social Gatherings with Friends and Family: Not on file  . Attends Religious Services: Not on file  . Active Member of Clubs or Organizations: Not on file  . Attends Archivist Meetings: Not on file  . Marital Status: Not on file  Intimate Partner Violence:   . Fear of Current or Ex-Partner: Not on file  . Emotionally Abused: Not on file  . Physically Abused: Not on file  . Sexually Abused: Not on file    FAMILY HISTORY: Family History  Problem Relation Age of Onset  . Hypertension Mother   . Heart disease Mother   . Heart disease Brother     ALLERGIES:  has No Known Allergies.  MEDICATIONS:  Current Outpatient Medications  Medication Sig Dispense Refill  . acetaminophen (TYLENOL) 325 MG tablet Take 650 mg by mouth every 6 (six) hours as needed.    Marland Kitchen amoxicillin (AMOXIL) 500 MG capsule Take four capsules one hour before dental appointment. 4 capsule 3  . aspirin 81 MG chewable tablet Chew by mouth daily.    Marland Kitchen atorvastatin (LIPITOR) 10 MG tablet Take 10 mg by mouth daily.    . Cholecalciferol (VITAMIN D3 SUPER STRENGTH) 50 MCG (2000 UT) TABS Take 2,000 Units by mouth.    . cyanocobalamin 1000 MCG tablet Take 1,000 mcg by mouth daily.    . folic acid  (FOLVITE) 297 MCG tablet Take 400 mcg by mouth daily.    Marland Kitchen lidocaine (XYLOCAINE) 2 % solution Patient: Mix 1part 2% viscous lidocaine, 1part H20. Swish & swallow 54mL of diluted mixture, 84min before meals and at bedtime, up to QID 200 mL 5  . lidocaine-prilocaine (EMLA) cream Apply 1 application topically as needed. 30 g 0  . LORazepam (ATIVAN) 0.5 MG tablet Take 1 tab 30 min before radiotherapy, prn anxiety.  Will need a driver if taking this medication. 20 tablet 0  . melatonin 5 MG TABS Take 5 mg by mouth at bedtime.    . Misc Natural Products (CHROMIUM PICOLINATE FORTIFIED PO) Take by mouth.    . Nutritional Supplements (ANTI-INFLAMMATORY ENZYME) CAPS Take by mouth. Recover AI    .  ondansetron (ZOFRAN) 8 MG tablet Take 1 tablet (8 mg total) by mouth every 8 (eight) hours as needed for nausea or vomiting. 20 tablet 0  . prochlorperazine (COMPAZINE) 10 MG tablet Take 1 tablet (10 mg total) by mouth every 6 (six) hours as needed for nausea or vomiting. 30 tablet 0  . sodium fluoride (PREVIDENT 5000 PLUS) 1.1 % CREA dental cream Apply to tooth brush. Brush teeth for 2 minutes. Spit out excess-DO NOT swallow. Repeat nightly. 102 g prn   No current facility-administered medications for this visit.    REVIEW OF SYSTEMS:   Constitutional: ( - ) fevers, ( - )  chills , ( - ) night sweats Eyes: ( - ) blurriness of vision, ( - ) double vision, ( - ) watery eyes Ears, nose, mouth, throat, and face: ( - ) mucositis, ( - ) sore throat Respiratory: ( - ) cough, ( - ) dyspnea, ( - ) wheezes Cardiovascular: ( - ) palpitation, ( - ) chest discomfort, ( - ) lower extremity swelling Gastrointestinal:  ( - ) nausea, ( - ) heartburn, ( - ) change in bowel habits Skin: ( - ) abnormal skin rashes Lymphatics: ( - ) new lymphadenopathy, ( - ) easy bruising Neurological: ( - ) numbness, ( - ) tingling, ( - ) new weaknesses Behavioral/Psych: ( - ) mood change, ( - ) new changes  All other systems were reviewed  with the patient and are negative.  PHYSICAL EXAMINATION: ECOG PERFORMANCE STATUS: 0 - Asymptomatic  Vitals:   07/17/20 0817  BP: (!) 125/92  Pulse: 68  Resp: 18  Temp: (!) 97 F (36.1 C)  SpO2: 99%   Filed Weights   07/17/20 0817  Weight: 168 lb 3.2 oz (76.3 kg)    GENERAL: well appearing elderly Caucasian male alert, no distress and comfortable SKIN: skin color, texture, turgor are normal, no rashes or significant lesions EYES: conjunctiva are pink and non-injected, sclera clear LUNGS: clear to auscultation and percussion with normal breathing effort HEART: regular rate & rhythm and no murmurs and no lower extremity edema Musculoskeletal: no cyanosis of digits and no clubbing  PSYCH: alert & oriented x 3, fluent speech NEURO: no focal motor/sensory deficits  LABORATORY DATA:  I have reviewed the data as listed CBC Latest Ref Rng & Units 07/17/2020 07/10/2020 07/03/2020  WBC 4.0 - 10.5 K/uL 7.2 10.0 4.6  Hemoglobin 13.0 - 17.0 g/dL 13.8 16.1 14.9  Hematocrit 39 - 52 % 40.6 47.8 44.1  Platelets 150 - 400 K/uL 213 283 237    CMP Latest Ref Rng & Units 07/17/2020 07/10/2020 07/03/2020  Glucose 70 - 99 mg/dL 112(H) 117(H) 94  BUN 8 - 23 mg/dL 16 15 13   Creatinine 0.61 - 1.24 mg/dL 0.86 1.12 0.80  Sodium 135 - 145 mmol/L 133(L) 129(L) 136  Potassium 3.5 - 5.1 mmol/L 4.1 4.6 4.1  Chloride 98 - 111 mmol/L 102 95(L) 104  CO2 22 - 32 mmol/L 26 31 26   Calcium 8.9 - 10.3 mg/dL 9.7 10.2 9.5  Total Protein 6.5 - 8.1 g/dL 6.4(L) 7.1 6.6  Total Bilirubin 0.3 - 1.2 mg/dL 0.5 0.9 0.5  Alkaline Phos 38 - 126 U/L 95 98 81  AST 15 - 41 U/L 16 16 22   ALT 0 - 44 U/L 22 30 25     RADIOGRAPHIC STUDIES: IR Gastrostomy Tube  Result Date: 06/26/2020 INDICATION: 69 year old male with a history of head neck cancer referred for port catheter and percutaneous gastrostomy EXAM:  IMAGE GUIDED PLACEMENT PORT CATHETER IMAGE GUIDED PERCUTANEOUS GASTROSTOMY MEDICATIONS: 2 g Ancef; The antibiotic was  administered within an appropriate time interval prior to skin puncture. ANESTHESIA/SEDATION: Moderate (conscious) sedation was employed during this procedure. A total of Versed 2.0 mg and Fentanyl 100 mcg was administered intravenously. Moderate Sedation Time: 25 minutes. The patient's level of consciousness and vital signs were monitored continuously by radiology nursing throughout the procedure under my direct supervision. FLUOROSCOPY TIME:  Fluoroscopy Time: 0 minutes 30 seconds (3 mGy). COMPLICATIONS: None PROCEDURE: Informed written consent was obtained from the patient after a thorough discussion of the procedural risks, benefits and alternatives. All questions were addressed. Maximal Sterile Barrier Technique was utilized including caps, mask, sterile gowns, sterile gloves, sterile drape, hand hygiene and skin antiseptic. A timeout was performed prior to the initiation of the procedure. The procedure, risks, benefits, and alternatives were explained to the patient. Questions regarding the procedure were encouraged and answered. The patient understands and consents to the procedure. Ultrasound survey was performed with images stored and sent to PACs. The right neck and chest was prepped with chlorhexidine, and draped in the usual sterile fashion using maximum barrier technique (cap and mask, sterile gown, sterile gloves, large sterile sheet, hand hygiene and cutaneous antiseptic). Antibiotic prophylaxis was provided with 2.0g Ancef administered IV one hour prior to skin incision. Local anesthesia was attained by infiltration with 1% lidocaine without epinephrine. Ultrasound demonstrated patency of the right internal jugular vein, and this was documented with an image. Under real-time ultrasound guidance, this vein was accessed with a 21 gauge micropuncture needle and image documentation was performed. A small dermatotomy was made at the access site with an 11 scalpel. A 0.018" wire was advanced into the SVC  and used to estimate the length of the internal catheter. The access needle exchanged for a 75F micropuncture vascular sheath. The 0.018" wire was then removed and a 0.035" wire advanced into the IVC. An appropriate location for the subcutaneous reservoir was selected below the clavicle and an incision was made through the skin and underlying soft tissues. The subcutaneous tissues were then dissected using a combination of blunt and sharp surgical technique and a pocket was formed. A single lumen power injectable portacatheter was then tunneled through the subcutaneous tissues from the pocket to the dermatotomy and the port reservoir placed within the subcutaneous pocket. The venous access site was then serially dilated and a peel away vascular sheath placed over the wire. The wire was removed and the port catheter advanced into position under fluoroscopic guidance. The catheter tip is positioned in the cavoatrial junction. This was documented with a spot image. The portacatheter was then tested and found to flush and aspirate well. The port was flushed with saline followed by 100 units/mL heparinized saline. The pocket was then closed in two layers using first subdermal inverted interrupted absorbable sutures followed by a running subcuticular suture. The epidermis was then sealed with Dermabond. The dermatotomy at the venous access site was also seal with Dermabond. We then proceeded with percutaneous gastrostomy. The epigastrium was prepped with Betadine in a sterile fashion, and a sterile drape was applied covering the operative field. A sterile gown and sterile gloves were used for the procedure. A 5-French orogastric tube is placed under fluoroscopic guidance. Scout imaging of the abdomen confirms barium within the transverse colon. The stomach was distended with gas. Under fluoroscopic guidance, an 18 gauge needle was utilized to puncture the anterior wall of the body of the stomach.  An Amplatz wire was  advanced through the needle passing a T fastener into the lumen of the stomach. The T fastener was secured for gastropexy. A 9-French sheath was inserted. A snare was advanced through the 9-French sheath. A Britta Mccreedy was advanced through the orogastric tube. It was snared then pulled out the oral cavity, pulling the snare, as well. The leading edge of the gastrostomy was attached to the snare. It was then pulled down the esophagus and out the percutaneous site. It was secured in place. Contrast was injected. No complication Patient tolerated the procedure well and remained hemodynamically stable throughout. No complications encountered and no significant blood loss encountered IMPRESSION: Status post image guided port catheter placement, as well as image guided gastrostomy. Signed, Dulcy Fanny. Dellia Nims, RPVI [facility] Radiology Electronically Signed   By: Corrie Mckusick D.O.   On: 06/26/2020 15:39   IR IMAGING GUIDED PORT INSERTION  Result Date: 06/26/2020 INDICATION: 69 year old male with a history of head neck cancer referred for port catheter and percutaneous gastrostomy EXAM: IMAGE GUIDED PLACEMENT PORT CATHETER IMAGE GUIDED PERCUTANEOUS GASTROSTOMY MEDICATIONS: 2 g Ancef; The antibiotic was administered within an appropriate time interval prior to skin puncture. ANESTHESIA/SEDATION: Moderate (conscious) sedation was employed during this procedure. A total of Versed 2.0 mg and Fentanyl 100 mcg was administered intravenously. Moderate Sedation Time: 25 minutes. The patient's level of consciousness and vital signs were monitored continuously by radiology nursing throughout the procedure under my direct supervision. FLUOROSCOPY TIME:  Fluoroscopy Time: 0 minutes 30 seconds (3 mGy). COMPLICATIONS: None PROCEDURE: Informed written consent was obtained from the patient after a thorough discussion of the procedural risks, benefits and alternatives. All questions were  addressed. Maximal Sterile Barrier Technique was utilized including caps, mask, sterile gowns, sterile gloves, sterile drape, hand hygiene and skin antiseptic. A timeout was performed prior to the initiation of the procedure. The procedure, risks, benefits, and alternatives were explained to the patient. Questions regarding the procedure were encouraged and answered. The patient understands and consents to the procedure. Ultrasound survey was performed with images stored and sent to PACs. The right neck and chest was prepped with chlorhexidine, and draped in the usual sterile fashion using maximum barrier technique (cap and mask, sterile gown, sterile gloves, large sterile sheet, hand hygiene and cutaneous antiseptic). Antibiotic prophylaxis was provided with 2.0g Ancef administered IV one hour prior to skin incision. Local anesthesia was attained by infiltration with 1% lidocaine without epinephrine. Ultrasound demonstrated patency of the right internal jugular vein, and this was documented with an image. Under real-time ultrasound guidance, this vein was accessed with a 21 gauge micropuncture needle and image documentation was performed. A small dermatotomy was made at the access site with an 11 scalpel. A 0.018" wire was advanced into the SVC and used to estimate the length of the internal catheter. The access needle exchanged for a 54F micropuncture vascular sheath. The 0.018" wire was then removed and a 0.035" wire advanced into the IVC. An appropriate location for the subcutaneous reservoir was selected below the clavicle and an incision was made through the skin and underlying soft tissues. The subcutaneous tissues were then dissected using a combination of blunt and sharp surgical technique and a pocket was formed. A single lumen power injectable portacatheter was then tunneled through the subcutaneous tissues from the pocket to the dermatotomy and the port reservoir placed within the subcutaneous pocket. The  venous access site was then serially dilated and a peel away  vascular sheath placed over the wire. The wire was removed and the port catheter advanced into position under fluoroscopic guidance. The catheter tip is positioned in the cavoatrial junction. This was documented with a spot image. The portacatheter was then tested and found to flush and aspirate well. The port was flushed with saline followed by 100 units/mL heparinized saline. The pocket was then closed in two layers using first subdermal inverted interrupted absorbable sutures followed by a running subcuticular suture. The epidermis was then sealed with Dermabond. The dermatotomy at the venous access site was also seal with Dermabond. We then proceeded with percutaneous gastrostomy. The epigastrium was prepped with Betadine in a sterile fashion, and a sterile drape was applied covering the operative field. A sterile gown and sterile gloves were used for the procedure. A 5-French orogastric tube is placed under fluoroscopic guidance. Scout imaging of the abdomen confirms barium within the transverse colon. The stomach was distended with gas. Under fluoroscopic guidance, an 18 gauge needle was utilized to puncture the anterior wall of the body of the stomach. An Amplatz wire was advanced through the needle passing a T fastener into the lumen of the stomach. The T fastener was secured for gastropexy. A 9-French sheath was inserted. A snare was advanced through the 9-French sheath. A Britta Mccreedy was advanced through the orogastric tube. It was snared then pulled out the oral cavity, pulling the snare, as well. The leading edge of the gastrostomy was attached to the snare. It was then pulled down the esophagus and out the percutaneous site. It was secured in place. Contrast was injected. No complication Patient tolerated the procedure well and remained hemodynamically stable throughout. No complications encountered and no significant blood loss encountered  IMPRESSION: Status post image guided port catheter placement, as well as image guided gastrostomy. Signed, Dulcy Fanny. Dellia Nims, RPVI [facility] Radiology Electronically Signed   By: Corrie Mckusick D.O.   On: 06/26/2020 15:39    ASSESSMENT & PLAN Frutoso Dimare 69 y.o. male with medical history significant for SCC of the base of the tongue who presents for a follow up visit.  Review the labs, review the records, discussion with the patient the findings are most consistent with a squamous cell carcinoma of the back base of the tongue.  This was a T1 N1 M0 p16 positive cancer which makes it stage I.  Overall the prognosis for this is quite well and there is an excellent chance of durable remission after chemoradiation therapy with cisplatin.   On exam today Mr. Fudala notes tolerated week 2 of treatment quite well.  He is not having any skin changes or throat pain at this time.  He is willing and able to proceed with week 3 of chemoradiation.  He is slowly losing weight and his life is becoming more ropey, but he is taking in 100% of his nutrition p.o.  Previously we discussed treatment moving forward and the expected side effects and symptoms.  Side effects include but are not limited to fatigue, anemia, kidney dysfunction, nausea, vomiting, diarrhea, throat pain, weight loss, and neuropathy.  Patient his wife were agreeable to proceeding with treatment.  We also discussed the medications to help with potential side effects including Zofran as needed, Compazine as needed, and EMLA cream.  He is not currently in pain and therefore we have not yet started pain medication but we will begin this once he begins to experience throat pain.  The treatment of choice for  this patient will be radiation with concurrent cisplatin 40 mg per metered squared weekly x7 doses.  We started this on 07/03/2020 and have weekly visits with the patient while he is receiving this  treatment.  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 P16+, Stage I  --OK to proceed with chemoradiation Week 3 today. Plan for 7 weekly doses of cisplatin 40mg /m2 to be administered with radiation --supportive care as noted below. --plan for weekly visits in clinic with labs --weekly CMP, CBC. Periodic TSH while receiving radiation.  --plan to see patient between Week 4-5 of cisplatin on 07/26/2020.   #Supportive Care --patient provided with zofran 8mg  q8H PRN and compazine 10mg  q6H PRN.  --EMLA cream for port provided --PEG and Port in place. Appreciate assistance from dietary regarding tube feeds --patient not currently in pain, will prescribe medication when symptoms begin.   No orders of the defined types were placed in this encounter.   All questions were answered. The patient knows to call the clinic with any problems, questions or concerns.  A total of more than 30 minutes were spent on this encounter and over half of that time was spent on counseling and coordination of care as outlined above.   Ledell Peoples, MD Department of Hematology/Oncology [place] [facility] Phone: [phone] Pager: [phone] Email: [email or web address]  07/17/2020 9:01 AM

## 2020-07-17 ENCOUNTER — Ambulatory Visit: Payer: Medicare Other | Admitting: Hematology and Oncology

## 2020-07-17 ENCOUNTER — Inpatient Hospital Stay (HOSPITAL_BASED_OUTPATIENT_CLINIC_OR_DEPARTMENT_OTHER): Payer: Medicare Other | Admitting: Hematology and Oncology

## 2020-07-17 ENCOUNTER — Inpatient Hospital Stay: Payer: Medicare Other

## 2020-07-17 ENCOUNTER — Other Ambulatory Visit: Payer: Self-pay

## 2020-07-17 ENCOUNTER — Encounter: Payer: Self-pay | Admitting: Hematology and Oncology

## 2020-07-17 ENCOUNTER — Ambulatory Visit
Admission: RE | Admit: 2020-07-17 | Discharge: 2020-07-17 | Disposition: A | Discharge status: Home / Self Care | Payer: Medicare Other | Source: Ambulatory Visit | Attending: Radiation Oncology | Admitting: Radiation Oncology

## 2020-07-17 VITALS — BP 125/92 | HR 68 | Temp 97.0°F | Resp 18 | Ht 72.0 in | Wt 168.2 lb

## 2020-07-17 DIAGNOSIS — C01 Malignant neoplasm of base of tongue: Secondary | ICD-10-CM | POA: Diagnosis not present

## 2020-07-17 DIAGNOSIS — Z95828 Presence of other vascular implants and grafts: Secondary | ICD-10-CM

## 2020-07-17 DIAGNOSIS — Z5111 Encounter for antineoplastic chemotherapy: Secondary | ICD-10-CM | POA: Diagnosis not present

## 2020-07-17 LAB — CMP (CANCER CENTER ONLY)
ALT: 22 U/L (ref 0–44)
AST: 16 U/L (ref 15–41)
Albumin: 3.2 g/dL — ABNORMAL LOW (ref 3.5–5.0)
Alkaline Phosphatase: 95 U/L (ref 38–126)
Anion gap: 5 (ref 5–15)
BUN: 16 mg/dL (ref 8–23)
CO2: 26 mmol/L (ref 22–32)
Calcium: 9.7 mg/dL (ref 8.9–10.3)
Chloride: 102 mmol/L (ref 98–111)
Creatinine: 0.86 mg/dL (ref 0.61–1.24)
GFR, Estimated: >60 mL/min (ref >60)
Glucose, Bld: 112 mg/dL — ABNORMAL HIGH (ref 70–99)
Potassium: 4.1 mmol/L (ref 3.5–5.1)
Sodium: 133 mmol/L — ABNORMAL LOW (ref 135–145)
Total Bilirubin: 0.5 mg/dL (ref 0.3–1.2)
Total Protein: 6.4 g/dL — ABNORMAL LOW (ref 6.5–8.1)

## 2020-07-17 LAB — CBC WITH DIFFERENTIAL (CANCER CENTER ONLY)
Abs Immature Granulocytes: 0.05 10*3/uL (ref 0.00–0.07)
Basophils Absolute: 0 10*3/uL (ref 0.0–0.1)
Basophils Relative: 0 %
Eosinophils Absolute: 0 10*3/uL (ref 0.0–0.5)
Eosinophils Relative: 1 %
HCT: 40.6 % (ref 39.0–52.0)
Hemoglobin: 13.8 g/dL (ref 13.0–17.0)
Immature Granulocytes: 1 %
Lymphocytes Relative: 13 %
Lymphs Abs: 1 10*3/uL (ref 0.7–4.0)
MCH: 30.2 pg (ref 26.0–34.0)
MCHC: 34 g/dL (ref 30.0–36.0)
MCV: 88.8 fL (ref 80.0–100.0)
Monocytes Absolute: 0.7 10*3/uL (ref 0.1–1.0)
Monocytes Relative: 9 %
Neutro Abs: 5.5 10*3/uL (ref 1.7–7.7)
Neutrophils Relative %: 76 %
Platelet Count: 213 10*3/uL (ref 150–400)
RBC: 4.57 MIL/uL (ref 4.22–5.81)
RDW: 12.1 % (ref 11.5–15.5)
WBC Count: 7.2 10*3/uL (ref 4.0–10.5)
nRBC: 0 % (ref 0.0–0.2)

## 2020-07-17 MED ORDER — SODIUM CHLORIDE 0.9 % IV SOLN
10.0000 mg | Freq: Once | INTRAVENOUS | Status: AC
  Administered 2020-07-17: 10 mg via INTRAVENOUS
  Filled 2020-07-17: qty 10

## 2020-07-17 MED ORDER — SODIUM CHLORIDE 0.9 % IV SOLN
Freq: Once | INTRAVENOUS | Status: AC
  Filled 2020-07-17: qty 10

## 2020-07-17 MED ORDER — SODIUM CHLORIDE 0.9% FLUSH
10.0000 mL | INTRAVENOUS | Status: DC | PRN
  Administered 2020-07-17: 10 mL
  Filled 2020-07-17: qty 10

## 2020-07-17 MED ORDER — PALONOSETRON HCL INJECTION 0.25 MG/5ML
0.2500 mg | Freq: Once | INTRAVENOUS | Status: AC
  Administered 2020-07-17: 0.25 mg via INTRAVENOUS

## 2020-07-17 MED ORDER — SODIUM CHLORIDE 0.9% FLUSH
10.0000 mL | Freq: Once | INTRAVENOUS | Status: AC
  Administered 2020-07-17: 10 mL
  Filled 2020-07-17: qty 10

## 2020-07-17 MED ORDER — HEPARIN SOD (PORK) LOCK FLUSH 100 UNIT/ML IV SOLN
500.0000 [IU] | Freq: Once | INTRAVENOUS | Status: AC | PRN
  Administered 2020-07-17: 500 [IU]
  Filled 2020-07-17: qty 5

## 2020-07-17 MED ORDER — SODIUM CHLORIDE 0.9 % IV SOLN
40.0000 mg/m2 | Freq: Once | INTRAVENOUS | Status: AC
  Administered 2020-07-17: 81 mg via INTRAVENOUS
  Filled 2020-07-17: qty 81

## 2020-07-17 MED ORDER — SODIUM CHLORIDE 0.9 % IV SOLN
150.0000 mg | Freq: Once | INTRAVENOUS | Status: AC
  Administered 2020-07-17: 150 mg via INTRAVENOUS
  Filled 2020-07-17: qty 150

## 2020-07-17 MED ORDER — SODIUM CHLORIDE 0.9 % IV SOLN
Freq: Once | INTRAVENOUS | Status: AC
  Filled 2020-07-17: qty 250

## 2020-07-17 MED ORDER — PALONOSETRON HCL INJECTION 0.25 MG/5ML
INTRAVENOUS | Status: AC
  Filled 2020-07-17: qty 5

## 2020-07-17 NOTE — Progress Notes (Signed)
Per Dr. Lorenso Courier, ok to treat with Mg from 07/10/20.

## 2020-07-17 NOTE — Patient Instructions (Signed)
[facility] Discharge Instructions for Patients Receiving Chemotherapy  Today you received the following chemotherapy agents: Cisplatin  To help prevent nausea and vomiting after your treatment, we encourage you to take your nausea medication  as prescribed.    If you develop nausea and vomiting that is not controlled by your nausea medication, call the clinic.   BELOW ARE SYMPTOMS THAT SHOULD BE REPORTED IMMEDIATELY:  *FEVER GREATER THAN 100.5 F  *CHILLS WITH OR WITHOUT FEVER  NAUSEA AND VOMITING THAT IS NOT CONTROLLED WITH YOUR NAUSEA MEDICATION  *UNUSUAL SHORTNESS OF BREATH  *UNUSUAL BRUISING OR BLEEDING  TENDERNESS IN MOUTH AND THROAT WITH OR WITHOUT PRESENCE OF ULCERS  *URINARY PROBLEMS  *BOWEL PROBLEMS  UNUSUAL RASH Items with * indicate a potential emergency and should be followed up as soon as possible.  Feel free to call the clinic should you have any questions or concerns. The clinic phone number is [phone].  Please show the CHEMO ALERT CARD at check-in to the Emergency Department and triage nurse.   

## 2020-07-18 ENCOUNTER — Ambulatory Visit
Admission: RE | Admit: 2020-07-18 | Discharge: 2020-07-18 | Disposition: A | Discharge status: Home / Self Care | Payer: Medicare Other | Source: Ambulatory Visit | Attending: Radiation Oncology | Admitting: Radiation Oncology

## 2020-07-18 DIAGNOSIS — C01 Malignant neoplasm of base of tongue: Secondary | ICD-10-CM | POA: Diagnosis not present

## 2020-07-19 ENCOUNTER — Ambulatory Visit
Admission: RE | Admit: 2020-07-19 | Discharge: 2020-07-19 | Disposition: A | Discharge status: Home / Self Care | Payer: Medicare Other | Source: Ambulatory Visit | Attending: Radiation Oncology | Admitting: Radiation Oncology

## 2020-07-19 DIAGNOSIS — C01 Malignant neoplasm of base of tongue: Secondary | ICD-10-CM | POA: Diagnosis not present

## 2020-07-20 ENCOUNTER — Ambulatory Visit
Admission: RE | Admit: 2020-07-20 | Discharge: 2020-07-20 | Disposition: A | Discharge status: Home / Self Care | Payer: Medicare Other | Source: Ambulatory Visit | Attending: Radiation Oncology | Admitting: Radiation Oncology

## 2020-07-20 ENCOUNTER — Other Ambulatory Visit: Payer: Self-pay

## 2020-07-20 ENCOUNTER — Other Ambulatory Visit: Payer: Self-pay | Admitting: *Deleted

## 2020-07-20 ENCOUNTER — Encounter: Payer: Self-pay | Admitting: General Practice

## 2020-07-20 ENCOUNTER — Ambulatory Visit: Payer: Medicare Other

## 2020-07-20 DIAGNOSIS — R1310 Dysphagia, unspecified: Secondary | ICD-10-CM

## 2020-07-20 DIAGNOSIS — C01 Malignant neoplasm of base of tongue: Secondary | ICD-10-CM | POA: Diagnosis not present

## 2020-07-20 DIAGNOSIS — R293 Abnormal posture: Secondary | ICD-10-CM | POA: Diagnosis not present

## 2020-07-20 MED ORDER — PROCHLORPERAZINE MALEATE 10 MG PO TABS: 10.0000 mg | ORAL_TABLET | Freq: Four times a day (QID) | ORAL | 0 refills | Status: DC | PRN

## 2020-07-20 MED FILL — PROCHLORPERAZINE 10 MG TAB: 10 | 7 days supply | Qty: 30 | Fill #0

## 2020-07-20 NOTE — Patient Instructions (Signed)
SWALLOWING EXERCISES Do these until 6 months after your last day of radiation, then 2 times per week afterwards  1. Effortful Swallows - Press your tongue against the roof of your mouth for 3 seconds, then squeeze the muscles in your neck while you swallow your saliva or a sip of water - Repeat 10-15 times, 2-3 times a day, and use whenever you eat or drink  2. Masako Swallow - swallow with your tongue sticking out - Stick tongue out past your teeth and gently bite tongue with your teeth - Swallow, while holding your tongue with your teeth - Repeat 10-15 times, 2-3 times a day *use a wet spoon if your mouth gets dry*  3. Pitch Raise - Repeat "he", once per second in as high of a pitch as you can - Repeat 20 times, 2-3 times a day  4. Shaker Exercise - head lift - Lie flat on your back in your bed or on a couch without pillows - Raise your head and look at your feet - KEEP YOUR SHOULDERS DOWN - HOLD FOR 45-60 SECONDS, then lower your head back down - Repeat 3 times, 2-3 times a day  5. Mendelsohn Maneuver - "half swallow" exercise - Start to swallow, and keep your Adam's apple up by squeezing hard with the muscles of the throat - Hold the squeeze for 5-7 seconds and then relax - Repeat 10-15 times, 2-3 times a day *use a wet spoon if your mouth gets dry*  6. Chin pushback - Open your mouth  - Place your fist UNDER your chin near your neck - Tuck your chin and push back with your fist for 5 seconds - Repeat 10 times, 2-3 times a day       10. "Super Swallow"  - Take a breath and hold it  - Bear down (like pushing your bowels)  - Swallow then IMMEDIATELY cough  - Repeat 10 times, 2-3 times a day

## 2020-07-20 NOTE — Therapy (Signed)
[facility] [address] [place], [place], [address] Phone: [phone]   Fax:  [phone]  Speech Language Pathology Evaluation  Patient Details  Name: Patrick Nelson MRN: [id number] Date of Birth: 1951-01-07 Referring Provider (SLP): Eppie Gibson, MD   Encounter Date: 07/20/2020   End of Session - 07/20/20 1105    Visit Number 1    Number of Visits 7    Date for SLP Re-Evaluation 10/18/20   90 days   SLP Start Time 0830    SLP Stop Time  0910    SLP Time Calculation (min) 40 min    Activity Tolerance Patient tolerated treatment well           Past Medical History:  Diagnosis Date  . Hyperlipidemia   . Paroxysmal atrial fibrillation [facility])     Past Surgical History:  Procedure Laterality Date  . DIRECT LARYNGOSCOPY N/A 05/22/2020   Procedure: DIRECT LARYNGOSCOPY w/BIOPSY TONGUE BASE;  Surgeon: Izora Gala, MD;  Location: [facility];  Service: ENT;  Laterality: N/A;  . IR GASTROSTOMY TUBE MOD SED  06/26/2020  . IR IMAGING GUIDED PORT INSERTION  06/26/2020  . TOTAL HIP ARTHROPLASTY  2012   right hip-Dr. Berenice Primas    There were no vitals filed for this visit.   Subjective Assessment - 07/20/20 0837    Subjective "I can swallow just fine, it's that I'm losing the taste."              SLP Evaluation [facility] - 07/20/20 0827      SLP Visit Information   SLP Received On 07/20/20    Referring Provider (SLP) Eppie Gibson, MD    Onset Date Late summer 2021    Medical Diagnosis Base of tongue CA      Subjective   Patient/Family Stated Goal Keep swallowing as normal as possible      Pain Assessment   Currently in Pain? Yes    Pain Score 2     Pain Location Throat    Pain Type Chronic pain    Pain Onset In the past 7 days    Pain Frequency Constant    Pain Relieving Factors meds    Effect of Pain on Daily Activities swallowing      General Information   HPI Pt with lump in rt neck  presented approx 2 months ago. CT scan on 05-11-20 revealed 1.5 cm mass at rt base of tongue and nodes involved on rt jugular chain. 05-22-20 biopsy revealing invasive mod differentiated SCCA, p16+. PET confirmed hypermeatabolic rt tongue base mass and rt level 2 and 3 metastatic carcinoma. Pt has been placed with PEG 06-26-20, receiving ChRad.       Prior Functional Status   Cognitive/Linguistic Baseline Within functional limits      Cognition   Overall Cognitive Status Within Functional Limits for tasks assessed      Auditory Comprehension   Overall Auditory Comprehension Appears within functional limits for tasks assessed      Oral Motor/Sensory Function   Overall Oral Motor/Sensory Function Appears within functional limits for tasks assessed      Motor Speech   Overall Motor Speech Appears within functional limits for tasks assessed            Pt currently tolerates regular food items and thin liquids - PO intake beginning to be limited by taste aversions. "I can swallow it down fine but something makes me want to gag with a lot  of food now." POs: Pt ate cheese stick and drank water without overt s/s aspiration. Thyroid elevation appeared WNL, and swallows appeared timely. Pt's swallow deemed WNL at this time.   Because data states the risk for dysphagia during and after radiation treatment is high due to undergoing radiation tx, SLP taught pt about the possibility of reduced/limited ability for PO intake during rad tx. SLP encouraged pt to continue swallowing POs as far into rad tx as possible, even ingesting POs and/or completing HEP shortly after administration of pain meds. Among other modifications for days when pt cannot functionally swallow, SLP talked about performing only non-swallowing tasks on the handout/HEP, and then adding swallowing tasks back in when it becomes possible to do so.  SLP educated pt re: changes to swallowing musculature after rad tx, and why adherence to  dysphagia HEP provided today and PO consumption was necessary to inhibit muscular disuse atrophy and to reduce muscle fibrosis following rad tx. Pt demonstrated understanding of these things to SLP.    SLP then developed a HEP for pt and pt was instructed how to perform exercises involving lingual, vocal, and pharyngeal strengthening. SLP performed each exercise and pt return demonstrated each exercise. SLP ensured pt performance was correct prior to moving on to next exercise. Pt was instructed to complete this program 2 times a day, 6-7 days/week until 6 months after his last rad tx, then x2-3 a week after that.                 SLP Education - 07/20/20 1104    Education Details HEP procedure, late effects head/neck radiation on swallow ability    Person(s) Educated Patient    Methods Explanation;Demonstration;Verbal cues;Handout    Comprehension Verbalized understanding;Returned demonstration;Verbal cues required;Need further instruction            SLP Short Term Goals - 07/20/20 1108      SLP SHORT TERM GOAL #1   Title pt will complete HEP with rare min A    Time 1    Period --   sessions, for all STGs   Status New      SLP SHORT TERM GOAL #2   Title pt will tell SLP why pt is completing HEP with modified independence    Time 1    Status New      SLP SHORT TERM GOAL #3   Title pt will tell SLP how a food journal could hasten return to a more normalized diet    Time 2    Status New            SLP Long Term Goals - 07/20/20 1109      SLP LONG TERM GOAL #1   Title pt will complete HEP with modified independence over two visits    Time 4    Period --   sessions, for all LTGs   Status New      SLP LONG TERM GOAL #2   Title pt will describe 3 overt s/s aspiration PNA with modified independence    Time 4    Status New      SLP LONG TERM GOAL #3   Title pt will describe how to modify HEP over time, and the timeline associated with reduction in HEP frequency  with modified independence over two sessions    Time 6    Status New            Plan - 07/20/20 1106    Clinical  Impression Statement At this time pt swallowing is deemed WNL/WFL with cheese stick and water, pt states his swallowing is most affected by taste aversions - [employer] (dietician) is aware of this and has been in contact with pt. SLP designed an individualized HEP for dysphagia and pt completed each exercise on their own with total cues faded to modified independent. There are no overt s/s aspiration reported by pt at this time. Data indicate that pt's swallow ability will likely decrease over the course of radiation therapy and could very well decline over time following conclusion of their radiation therapy due to muscle disuse atrophy and/or muscle fibrosis. Pt will cont to need to be seen by SLP in order to assess safety of PO intake, assess the need for recommending any objective swallow assessment, and ensuring pt correctly completes the individualized HEP.    Speech Therapy Frequency --   once every approx 4 weeks   Duration --   7 total sessions   Treatment/Interventions Aspiration precaution training;Pharyngeal strengthening exercises;Compensatory techniques;Diet toleration management by SLP;Trials of upgraded texture/liquids;Internal/external aids;Patient/family education;SLP instruction and feedback    Potential to Achieve Goals Good    SLP Home Exercise Plan provided    Consulted and Agree with Plan of Care Patient           Patient will benefit from skilled therapeutic intervention in order to improve the following deficits and impairments:   Dysphagia, unspecified type    Problem List Patient Active Problem List   Diagnosis Date Noted  . Port-A-Cath in place 07/17/2020  . Squamous cell carcinoma of base of tongue ([place]) 06/12/2020  . Changing skin lesion 01/07/2019  . Sebaceous cyst 01/07/2019  . Syncope 12/14/2014  . CAP (community acquired pneumonia)  12/14/2014  . Right lower lobe pneumonia 12/14/2014  . Parapneumonic effusion 12/14/2014  . Paroxysmal atrial fibrillation ([place]) 01/25/2014  . Hyperlipidemia 01/25/2014    [facility] ,[place], [place]  07/20/2020, 11:11 AM  [facility] [address] [place], [place], [address] Phone: [phone]   Fax:  [phone]  Name: Patrick Nelson MRN: [id number] Date of Birth: May 04, 1951

## 2020-07-20 NOTE — Progress Notes (Signed)
Oncology Nurse Navigator Documentation  I met with Mr. Czaja before his scheduled appointment with Garald Balding SLP today. He reports increased difficulty eating and plans to begin to use his PEG today. He will see Dory Peru RD on 10/25 during chemotherapy infusion. He knows to call me with any questions or concerns.   Harlow Asa RN, BSN, OCN Head & Neck Oncology Nurse [place] at [facility] Phone # [phone]  Fax # [phone]

## 2020-07-20 NOTE — Progress Notes (Signed)
[facility] Clinical Social Work  Initial Assessment   Patrick Nelson is a 69 y.o. year old male CSW spoke w him by phone   SDOH (Social Determinants of Health) assessments performed: Yes   Distress Screen completed: No   Family/Social Information:  . Housing Arrangement: patient lives with partner . 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 Transportation concerns: Drives himself, partner drives him when he gets infusion . Employment: Working part time.  . Financial concerns: No o Type of concern: None . Food access concerns: no issues w access to food, treatment is making it more difficult for him to eat so he plans to begin using his tube for additional nutrition . Medication Concerns: none at this time  Coping/ Adjustment to diagnosis: . Patient understands treatment plan and what happens next? Part way through treatment for stage 1 cancer at base of tongue.  No current concerns, feels he is tolerating treatment well. . Concerns about diagnosis and/or treatment: I'm not especially worried about anything . Patient reported stressors: none at this time . Current coping skills/ strengths: Average or above average intelligence, Capable of independent living, Communication skills, Financial means, Motivation for treatment/growth and Supportive family/friends    SUMMARY: Current SDOH Barriers:  . none   Interventions: . Discussed common feeling and emotions when being diagnosed with cancer, and the importance of support during treatment . Informed patient of the support team roles and support services at [facility] . Provided CSW contact information and encouraged patient to call with any questions or concerns . Patient interviewed and appropriate assessments performed   Follow Up Plan: none needed at this time Patient  verbalizes understanding of plan: Yes   Edwyna Shell, [place] Worker Phone:  [place] , LCSW

## 2020-07-21 ENCOUNTER — Ambulatory Visit
Admission: RE | Admit: 2020-07-21 | Discharge: 2020-07-21 | Disposition: A | Discharge status: Home / Self Care | Payer: Medicare Other | Source: Ambulatory Visit | Attending: Radiation Oncology | Admitting: Radiation Oncology

## 2020-07-21 DIAGNOSIS — C01 Malignant neoplasm of base of tongue: Secondary | ICD-10-CM | POA: Diagnosis not present

## 2020-07-24 ENCOUNTER — Inpatient Hospital Stay: Payer: Medicare Other

## 2020-07-24 ENCOUNTER — Other Ambulatory Visit: Payer: Self-pay | Admitting: *Deleted

## 2020-07-24 ENCOUNTER — Inpatient Hospital Stay: Payer: Medicare Other | Admitting: Nutrition

## 2020-07-24 ENCOUNTER — Ambulatory Visit
Admission: RE | Admit: 2020-07-24 | Discharge: 2020-07-24 | Disposition: A | Discharge status: Home / Self Care | Payer: Medicare Other | Source: Ambulatory Visit | Attending: Radiation Oncology | Admitting: Radiation Oncology

## 2020-07-24 ENCOUNTER — Other Ambulatory Visit: Payer: Self-pay

## 2020-07-24 VITALS — BP 110/70 | HR 73 | Temp 98.1°F | Resp 18

## 2020-07-24 DIAGNOSIS — Z5111 Encounter for antineoplastic chemotherapy: Secondary | ICD-10-CM | POA: Diagnosis not present

## 2020-07-24 DIAGNOSIS — C01 Malignant neoplasm of base of tongue: Secondary | ICD-10-CM

## 2020-07-24 LAB — CMP (CANCER CENTER ONLY)
ALT: 17 U/L (ref 0–44)
AST: 15 U/L (ref 15–41)
Albumin: 3.3 g/dL — ABNORMAL LOW (ref 3.5–5.0)
Alkaline Phosphatase: 89 U/L (ref 38–126)
Anion gap: 6 (ref 5–15)
BUN: 22 mg/dL (ref 8–23)
CO2: 27 mmol/L (ref 22–32)
Calcium: 9.8 mg/dL (ref 8.9–10.3)
Chloride: 99 mmol/L (ref 98–111)
Creatinine: 0.98 mg/dL (ref 0.61–1.24)
GFR, Estimated: >60 mL/min (ref >60)
Glucose, Bld: 155 mg/dL — ABNORMAL HIGH (ref 70–99)
Potassium: 4.3 mmol/L (ref 3.5–5.1)
Sodium: 132 mmol/L — ABNORMAL LOW (ref 135–145)
Total Bilirubin: 0.5 mg/dL (ref 0.3–1.2)
Total Protein: 6.2 g/dL — ABNORMAL LOW (ref 6.5–8.1)

## 2020-07-24 LAB — CBC WITH DIFFERENTIAL (CANCER CENTER ONLY)
Abs Immature Granulocytes: 0.03 10*3/uL (ref 0.00–0.07)
Basophils Absolute: 0 10*3/uL (ref 0.0–0.1)
Basophils Relative: 0 %
Eosinophils Absolute: 0 10*3/uL (ref 0.0–0.5)
Eosinophils Relative: 0 %
HCT: 38.4 % — ABNORMAL LOW (ref 39.0–52.0)
Hemoglobin: 13.8 g/dL (ref 13.0–17.0)
Immature Granulocytes: 1 %
Lymphocytes Relative: 10 %
Lymphs Abs: 0.5 10*3/uL — ABNORMAL LOW (ref 0.7–4.0)
MCH: 31.4 pg (ref 26.0–34.0)
MCHC: 35.9 g/dL (ref 30.0–36.0)
MCV: 87.3 fL (ref 80.0–100.0)
Monocytes Absolute: 0.5 10*3/uL (ref 0.1–1.0)
Monocytes Relative: 10 %
Neutro Abs: 4.1 10*3/uL (ref 1.7–7.7)
Neutrophils Relative %: 79 %
Platelet Count: 193 10*3/uL (ref 150–400)
RBC: 4.4 MIL/uL (ref 4.22–5.81)
RDW: 12 % (ref 11.5–15.5)
WBC Count: 5.3 10*3/uL (ref 4.0–10.5)
nRBC: 0 % (ref 0.0–0.2)

## 2020-07-24 LAB — MAGNESIUM: Magnesium: 1.8 mg/dL (ref 1.7–2.4)

## 2020-07-24 MED ORDER — HEPARIN SOD (PORK) LOCK FLUSH 100 UNIT/ML IV SOLN
500.0000 [IU] | Freq: Once | INTRAVENOUS | Status: AC | PRN
  Administered 2020-07-24: 500 [IU]
  Filled 2020-07-24: qty 5

## 2020-07-24 MED ORDER — KATE FARMS STANDARD 1.4 EN LIQD: 1.0000 | Freq: Every day | ENTERAL | 8 refills | Status: DC

## 2020-07-24 MED ORDER — SODIUM CHLORIDE 0.9 % IV SOLN
Freq: Once | INTRAVENOUS | Status: AC
  Filled 2020-07-24: qty 250

## 2020-07-24 MED ORDER — SODIUM CHLORIDE 0.9 % IV SOLN
Freq: Once | INTRAVENOUS | Status: AC
  Filled 2020-07-24: qty 10

## 2020-07-24 MED ORDER — PALONOSETRON HCL INJECTION 0.25 MG/5ML
0.2500 mg | Freq: Once | INTRAVENOUS | Status: AC
  Administered 2020-07-24: 0.25 mg via INTRAVENOUS

## 2020-07-24 MED ORDER — SODIUM CHLORIDE 0.9 % IV SOLN
10.0000 mg | Freq: Once | INTRAVENOUS | Status: AC
  Administered 2020-07-24: 10 mg via INTRAVENOUS
  Filled 2020-07-24: qty 10

## 2020-07-24 MED ORDER — SODIUM CHLORIDE 0.9 % IV SOLN
40.0000 mg/m2 | Freq: Once | INTRAVENOUS | Status: AC
  Administered 2020-07-24: 81 mg via INTRAVENOUS
  Filled 2020-07-24: qty 81

## 2020-07-24 MED ORDER — SODIUM CHLORIDE 0.9 % IV SOLN
150.0000 mg | Freq: Once | INTRAVENOUS | Status: AC
  Administered 2020-07-24: 150 mg via INTRAVENOUS
  Filled 2020-07-24: qty 150

## 2020-07-24 MED ORDER — PALONOSETRON HCL INJECTION 0.25 MG/5ML
INTRAVENOUS | Status: AC
  Filled 2020-07-24: qty 5

## 2020-07-24 MED ORDER — SODIUM CHLORIDE 0.9% FLUSH
10.0000 mL | INTRAVENOUS | Status: DC | PRN
  Administered 2020-07-24: 10 mL
  Filled 2020-07-24: qty 10

## 2020-07-24 NOTE — Patient Instructions (Signed)
[facility] Discharge Instructions for Patients Receiving Chemotherapy  Today you received the following chemotherapy agents Cisplatin  To help prevent nausea and vomiting after your treatment, we encourage you to take your nausea medication as directed  If you develop nausea and vomiting that is not controlled by your nausea medication, call the clinic.   BELOW ARE SYMPTOMS THAT SHOULD BE REPORTED IMMEDIATELY:  *FEVER GREATER THAN 100.5 F  *CHILLS WITH OR WITHOUT FEVER  NAUSEA AND VOMITING THAT IS NOT CONTROLLED WITH YOUR NAUSEA MEDICATION  *UNUSUAL SHORTNESS OF BREATH  *UNUSUAL BRUISING OR BLEEDING  TENDERNESS IN MOUTH AND THROAT WITH OR WITHOUT PRESENCE OF ULCERS  *URINARY PROBLEMS  *BOWEL PROBLEMS  UNUSUAL RASH Items with * indicate a potential emergency and should be followed up as soon as possible.  Feel free to call the clinic should you have any questions or concerns. The clinic phone number is [phone].  Please show the CHEMO ALERT CARD at check-in to the Emergency Department and triage nurse.   

## 2020-07-24 NOTE — Progress Notes (Signed)
Nutrition follow-up completed with patient during infusion for right tongue cancer. He is receiving concurrent chemoradiation therapy. Patient has lost 5% body weight in 5 weeks. Weight decreased and was documented as 168.2 pounds down from 170.8 pounds October 11. Noted labs: Sodium 132, glucose 155, albumin 3.3. Patient denies nausea, vomiting, constipation, and diarrhea. Reports tolerating 1 carton of Osmolite 1.5 via G-tube twice daily.  He is drinking juices and water by mouth.  Reports he is willing to try to drink Ensure Plus as well. Reports he cannot eat food because of the taste and inability to swallow solids.  Estimated nutrition needs: 2100-2300 cal, 92-107 g protein, 2.2 L fluid.  Nutrition diagnosis: Inadequate oral intake continues.  Intervention: Give 1 carton [employer] 1.4 - 5 times a day with 120 mL free water before and after bolus feeding. 5 cartons [employer] 1.4+ free water flushes provides 2275 cal, 100 g protein, 2370 mL free water.  This is 100% estimated nutrition needs. Give tube feeding every 3 hours. Be sure to give slowly over 10-15 minutes. Orders were written.  [employer] notified. Provided an additional complementary case of Osmolite 1.5 to use until his [employer] [place] 1.4 is delivered.  Monitoring, evaluation, goals: Patient will tolerate tube feeding and oral intake to meet 90% minimum estimated nutrition needs.  Next visit: Monday, November 1 during infusion.  **Disclaimer: This note was dictated with voice recognition software. Similar sounding words can inadvertently be transcribed and this note may contain transcription errors which may not have been corrected upon publication of note.**

## 2020-07-24 NOTE — Progress Notes (Signed)
Per Dr. Julien Nordmann, okay to continue with pre-meds and treatment with 182ml of urine output.

## 2020-07-25 ENCOUNTER — Ambulatory Visit
Admission: RE | Admit: 2020-07-25 | Discharge: 2020-07-25 | Disposition: A | Discharge status: Home / Self Care | Payer: Medicare Other | Source: Ambulatory Visit | Attending: Radiation Oncology | Admitting: Radiation Oncology

## 2020-07-25 DIAGNOSIS — C01 Malignant neoplasm of base of tongue: Secondary | ICD-10-CM | POA: Diagnosis not present

## 2020-07-25 MED ORDER — SONAFINE EX EMUL
1.0000 "application " | Freq: Two times a day (BID) | CUTANEOUS | Status: DC
  Administered 2020-07-25: 1 via TOPICAL

## 2020-07-26 ENCOUNTER — Other Ambulatory Visit: Payer: Self-pay

## 2020-07-26 ENCOUNTER — Ambulatory Visit
Admission: RE | Admit: 2020-07-26 | Discharge: 2020-07-26 | Disposition: A | Discharge status: Home / Self Care | Payer: Medicare Other | Source: Ambulatory Visit | Attending: Radiation Oncology | Admitting: Radiation Oncology

## 2020-07-26 ENCOUNTER — Encounter: Payer: Self-pay | Admitting: Hematology and Oncology

## 2020-07-26 ENCOUNTER — Other Ambulatory Visit: Payer: Self-pay | Admitting: *Deleted

## 2020-07-26 ENCOUNTER — Inpatient Hospital Stay (HOSPITAL_BASED_OUTPATIENT_CLINIC_OR_DEPARTMENT_OTHER): Payer: Medicare Other | Admitting: Hematology and Oncology

## 2020-07-26 VITALS — BP 104/73 | HR 70 | Temp 97.0°F | Resp 16 | Ht 72.0 in | Wt 165.0 lb

## 2020-07-26 DIAGNOSIS — C01 Malignant neoplasm of base of tongue: Secondary | ICD-10-CM

## 2020-07-26 DIAGNOSIS — Z5111 Encounter for antineoplastic chemotherapy: Secondary | ICD-10-CM | POA: Diagnosis not present

## 2020-07-26 MED ORDER — ONDANSETRON HCL 8 MG PO TABS: 8.0000 mg | ORAL_TABLET | Freq: Three times a day (TID) | ORAL | 0 refills | Status: DC | PRN
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
## 2020-07-27 ENCOUNTER — Ambulatory Visit
Admission: RE | Admit: 2020-07-27 | Discharge: 2020-07-27 | Disposition: A | Discharge status: Home / Self Care | Payer: Medicare Other | Source: Ambulatory Visit | Attending: Radiation Oncology | Admitting: Radiation Oncology

## 2020-07-27 DIAGNOSIS — C01 Malignant neoplasm of base of tongue: Secondary | ICD-10-CM | POA: Diagnosis not present

## 2020-07-27 MED FILL — SODIUM FLUORIDE 5000 PLUS 1: 1.1 | 60 days supply | Qty: 102 | Fill #1

## 2020-07-28 ENCOUNTER — Ambulatory Visit
Admission: RE | Admit: 2020-07-28 | Discharge: 2020-07-28 | Disposition: A | Discharge status: Home / Self Care | Payer: Medicare Other | Source: Ambulatory Visit | Attending: Radiation Oncology | Admitting: Radiation Oncology

## 2020-07-28 ENCOUNTER — Other Ambulatory Visit: Payer: Self-pay

## 2020-07-28 DIAGNOSIS — C01 Malignant neoplasm of base of tongue: Secondary | ICD-10-CM | POA: Diagnosis not present

## 2020-07-31 ENCOUNTER — Inpatient Hospital Stay: Payer: Medicare Other | Attending: Hematology and Oncology

## 2020-07-31 ENCOUNTER — Inpatient Hospital Stay: Payer: Medicare Other

## 2020-07-31 ENCOUNTER — Other Ambulatory Visit: Payer: Self-pay

## 2020-07-31 ENCOUNTER — Ambulatory Visit
Admission: RE | Admit: 2020-07-31 | Discharge: 2020-07-31 | Disposition: A | Discharge status: Home / Self Care | Payer: Medicare Other | Source: Ambulatory Visit | Attending: Radiation Oncology | Admitting: Radiation Oncology

## 2020-07-31 ENCOUNTER — Inpatient Hospital Stay: Payer: Medicare Other | Admitting: Nutrition

## 2020-07-31 VITALS — BP 115/81 | HR 71 | Temp 98.7°F | Resp 16 | Wt 164.2 lb

## 2020-07-31 DIAGNOSIS — Z95828 Presence of other vascular implants and grafts: Secondary | ICD-10-CM

## 2020-07-31 DIAGNOSIS — Z5111 Encounter for antineoplastic chemotherapy: Secondary | ICD-10-CM | POA: Insufficient documentation

## 2020-07-31 DIAGNOSIS — Z79899 Other long term (current) drug therapy: Secondary | ICD-10-CM | POA: Insufficient documentation

## 2020-07-31 DIAGNOSIS — Z931 Gastrostomy status: Secondary | ICD-10-CM | POA: Diagnosis not present

## 2020-07-31 DIAGNOSIS — C01 Malignant neoplasm of base of tongue: Secondary | ICD-10-CM | POA: Insufficient documentation

## 2020-07-31 LAB — CBC WITH DIFFERENTIAL (CANCER CENTER ONLY)
Abs Immature Granulocytes: 0.02 10*3/uL (ref 0.00–0.07)
Basophils Absolute: 0 10*3/uL (ref 0.0–0.1)
Basophils Relative: 0 %
Eosinophils Absolute: 0 10*3/uL (ref 0.0–0.5)
Eosinophils Relative: 0 %
HCT: 35.4 % — ABNORMAL LOW (ref 39.0–52.0)
Hemoglobin: 12.2 g/dL — ABNORMAL LOW (ref 13.0–17.0)
Immature Granulocytes: 0 %
Lymphocytes Relative: 9 %
Lymphs Abs: 0.5 10*3/uL — ABNORMAL LOW (ref 0.7–4.0)
MCH: 29.9 pg (ref 26.0–34.0)
MCHC: 34.5 g/dL (ref 30.0–36.0)
MCV: 86.8 fL (ref 80.0–100.0)
Monocytes Absolute: 0.6 10*3/uL (ref 0.1–1.0)
Monocytes Relative: 11 %
Neutro Abs: 4 10*3/uL (ref 1.7–7.7)
Neutrophils Relative %: 80 %
Platelet Count: 152 10*3/uL (ref 150–400)
RBC: 4.08 MIL/uL — ABNORMAL LOW (ref 4.22–5.81)
RDW: 12.5 % (ref 11.5–15.5)
WBC Count: 5.1 10*3/uL (ref 4.0–10.5)
nRBC: 0 % (ref 0.0–0.2)

## 2020-07-31 LAB — CMP (CANCER CENTER ONLY)
ALT: 15 U/L (ref 0–44)
AST: 15 U/L (ref 15–41)
Albumin: 3.1 g/dL — ABNORMAL LOW (ref 3.5–5.0)
Alkaline Phosphatase: 82 U/L (ref 38–126)
Anion gap: 7 (ref 5–15)
BUN: 18 mg/dL (ref 8–23)
CO2: 25 mmol/L (ref 22–32)
Calcium: 9.1 mg/dL (ref 8.9–10.3)
Chloride: 99 mmol/L (ref 98–111)
Creatinine: 0.78 mg/dL (ref 0.61–1.24)
GFR, Estimated: >60 mL/min (ref >60)
Glucose, Bld: 112 mg/dL — ABNORMAL HIGH (ref 70–99)
Potassium: 4.4 mmol/L (ref 3.5–5.1)
Sodium: 131 mmol/L — ABNORMAL LOW (ref 135–145)
Total Bilirubin: 0.5 mg/dL (ref 0.3–1.2)
Total Protein: 5.7 g/dL — ABNORMAL LOW (ref 6.5–8.1)

## 2020-07-31 LAB — MAGNESIUM: Magnesium: 1.8 mg/dL (ref 1.7–2.4)

## 2020-07-31 MED ORDER — SODIUM CHLORIDE 0.9 % IV SOLN
10.0000 mg | Freq: Once | INTRAVENOUS | Status: AC
  Administered 2020-07-31: 10 mg via INTRAVENOUS
  Filled 2020-07-31: qty 10

## 2020-07-31 MED ORDER — HEPARIN SOD (PORK) LOCK FLUSH 100 UNIT/ML IV SOLN
500.0000 [IU] | Freq: Once | INTRAVENOUS | Status: AC | PRN
  Administered 2020-07-31: 500 [IU]
  Filled 2020-07-31: qty 5

## 2020-07-31 MED ORDER — SODIUM CHLORIDE 0.9% FLUSH
10.0000 mL | INTRAVENOUS | Status: DC | PRN
  Administered 2020-07-31: 10 mL
  Filled 2020-07-31: qty 10

## 2020-07-31 MED ORDER — PALONOSETRON HCL INJECTION 0.25 MG/5ML
0.2500 mg | Freq: Once | INTRAVENOUS | Status: AC
  Administered 2020-07-31: 0.25 mg via INTRAVENOUS

## 2020-07-31 MED ORDER — SODIUM CHLORIDE 0.9 % IV SOLN
Freq: Once | INTRAVENOUS | Status: AC
  Filled 2020-07-31: qty 10

## 2020-07-31 MED ORDER — SODIUM CHLORIDE 0.9 % IV SOLN
40.0000 mg/m2 | Freq: Once | INTRAVENOUS | Status: AC
  Administered 2020-07-31: 81 mg via INTRAVENOUS
  Filled 2020-07-31: qty 81

## 2020-07-31 MED ORDER — PALONOSETRON HCL INJECTION 0.25 MG/5ML
INTRAVENOUS | Status: AC
  Filled 2020-07-31: qty 5

## 2020-07-31 MED ORDER — SODIUM CHLORIDE 0.9% FLUSH
10.0000 mL | Freq: Once | INTRAVENOUS | Status: AC
  Administered 2020-07-31: 10 mL
  Filled 2020-07-31: qty 10

## 2020-07-31 MED ORDER — SODIUM CHLORIDE 0.9 % IV SOLN
150.0000 mg | Freq: Once | INTRAVENOUS | Status: AC
  Administered 2020-07-31: 150 mg via INTRAVENOUS
  Filled 2020-07-31: qty 150

## 2020-07-31 MED ORDER — SODIUM CHLORIDE 0.9 % IV SOLN
Freq: Once | INTRAVENOUS | Status: AC
  Filled 2020-07-31: qty 250

## 2020-07-31 NOTE — Progress Notes (Signed)
Nutrition follow-up completed with patient during infusion for right tongue cancer. He is receiving concurrent chemoradiation therapy. Weight was documented as 164.25 pounds November 1, decreased from 177.25 pounds September 10.  This is a 7% weight loss over 6 weeks. Patient denies vomiting, diarrhea, and constipation. Reports difficulty swallowing but tolerates applesauce and water. Is tolerating 4 to 4-1/2 cartons of [employer] 1.4 daily with 120 mL free water before and after each bolus feeding. He reports some stomach upset but cannot tie it to tube feeding or treatment. There is no pattern. He denies any nutritional needs or questions at this time.  Estimated nutrition needs: 2100-2300 cal, 92-107 g protein, 2.2 L fluid.  Nutrition diagnosis: Inadequate oral intake continues.  Intervention: Increase [employer] 1.4 to 1 carton 5 times a day with 120 mL free water before and after bolus feeding. Continue oral intake as tolerated. Monitor nausea and take medication as needed. Continue soft foods as tolerated.  5 cartons [employer] 1.4+ free water flushes provides 2275 cal, 100 g protein, 2370 mL free water.  Monitoring, evaluation, goals: Patient will work to increase tube feeding to goal rate while continuing to eat and drink by mouth as tolerated for goal of weight maintenance.  Next visit: Monday, November 8 during infusion.  **Disclaimer: This note was dictated with voice recognition software. Similar sounding words can inadvertently be transcribed and this note may contain transcription errors which may not have been corrected upon publication of note.**

## 2020-07-31 NOTE — Patient Instructions (Signed)
[facility] Discharge Instructions for Patients Receiving Chemotherapy  Today you received the following chemotherapy agent: Cisplatin  To help prevent nausea and vomiting after your treatment, we encourage you to take your nausea medication as directed by your MD.   If you develop nausea and vomiting that is not controlled by your nausea medication, call the clinic.   BELOW ARE SYMPTOMS THAT SHOULD BE REPORTED IMMEDIATELY:  *FEVER GREATER THAN 100.5 F  *CHILLS WITH OR WITHOUT FEVER  NAUSEA AND VOMITING THAT IS NOT CONTROLLED WITH YOUR NAUSEA MEDICATION  *UNUSUAL SHORTNESS OF BREATH  *UNUSUAL BRUISING OR BLEEDING  TENDERNESS IN MOUTH AND THROAT WITH OR WITHOUT PRESENCE OF ULCERS  *URINARY PROBLEMS  *BOWEL PROBLEMS  UNUSUAL RASH Items with * indicate a potential emergency and should be followed up as soon as possible.  Feel free to call the clinic should you have any questions or concerns. The clinic phone number is [phone].  Please show the [facility] at check-in to the Emergency Department and triage nurse.

## 2020-08-01 ENCOUNTER — Ambulatory Visit
Admission: RE | Admit: 2020-08-01 | Discharge: 2020-08-01 | Disposition: A | Discharge status: Home / Self Care | Payer: Medicare Other | Source: Ambulatory Visit | Attending: Radiation Oncology | Admitting: Radiation Oncology

## 2020-08-01 ENCOUNTER — Other Ambulatory Visit: Payer: Self-pay

## 2020-08-01 DIAGNOSIS — C01 Malignant neoplasm of base of tongue: Secondary | ICD-10-CM | POA: Diagnosis not present

## 2020-08-02 ENCOUNTER — Telehealth: Payer: Self-pay | Admitting: Hematology and Oncology

## 2020-08-02 ENCOUNTER — Other Ambulatory Visit: Payer: Self-pay | Admitting: *Deleted

## 2020-08-02 ENCOUNTER — Ambulatory Visit
Admission: RE | Admit: 2020-08-02 | Discharge: 2020-08-02 | Disposition: A | Discharge status: Home / Self Care | Payer: Medicare Other | Source: Ambulatory Visit | Attending: Radiation Oncology | Admitting: Radiation Oncology

## 2020-08-02 DIAGNOSIS — C01 Malignant neoplasm of base of tongue: Secondary | ICD-10-CM

## 2020-08-02 NOTE — Telephone Encounter (Signed)
Scheduled appt per 11/3 sch msg - pt is aware of appts for this wk

## 2020-08-03 ENCOUNTER — Inpatient Hospital Stay: Payer: Medicare Other

## 2020-08-03 ENCOUNTER — Ambulatory Visit
Admission: RE | Admit: 2020-08-03 | Discharge: 2020-08-03 | Disposition: A | Discharge status: Home / Self Care | Payer: Medicare Other | Source: Ambulatory Visit | Attending: Radiation Oncology | Admitting: Radiation Oncology

## 2020-08-03 ENCOUNTER — Other Ambulatory Visit: Payer: Self-pay

## 2020-08-03 VITALS — BP 125/85 | HR 69 | Temp 98.0°F | Resp 16

## 2020-08-03 DIAGNOSIS — C01 Malignant neoplasm of base of tongue: Secondary | ICD-10-CM

## 2020-08-03 DIAGNOSIS — Z5111 Encounter for antineoplastic chemotherapy: Secondary | ICD-10-CM | POA: Diagnosis not present

## 2020-08-03 DIAGNOSIS — Z95828 Presence of other vascular implants and grafts: Secondary | ICD-10-CM

## 2020-08-03 MED ORDER — SODIUM CHLORIDE 0.9 % IV SOLN
Freq: Once | INTRAVENOUS | Status: AC
  Filled 2020-08-03: qty 250

## 2020-08-03 MED ORDER — SODIUM CHLORIDE 0.9% FLUSH
10.0000 mL | Freq: Once | INTRAVENOUS | Status: AC
  Administered 2020-08-03: 10 mL
  Filled 2020-08-03: qty 10

## 2020-08-03 MED ORDER — HEPARIN SOD (PORK) LOCK FLUSH 100 UNIT/ML IV SOLN
500.0000 [IU] | Freq: Once | INTRAVENOUS | Status: AC
  Administered 2020-08-03: 500 [IU]
  Filled 2020-08-03: qty 5

## 2020-08-03 NOTE — Patient Instructions (Signed)

## 2020-08-04 ENCOUNTER — Inpatient Hospital Stay: Payer: Medicare Other

## 2020-08-04 ENCOUNTER — Ambulatory Visit
Admission: RE | Admit: 2020-08-04 | Discharge: 2020-08-04 | Disposition: A | Discharge status: Home / Self Care | Payer: Medicare Other | Source: Ambulatory Visit | Attending: Radiation Oncology | Admitting: Radiation Oncology

## 2020-08-04 ENCOUNTER — Other Ambulatory Visit: Payer: Self-pay

## 2020-08-04 VITALS — BP 114/78 | HR 75 | Temp 98.0°F | Resp 16

## 2020-08-04 DIAGNOSIS — Z5111 Encounter for antineoplastic chemotherapy: Secondary | ICD-10-CM | POA: Diagnosis not present

## 2020-08-04 DIAGNOSIS — C01 Malignant neoplasm of base of tongue: Secondary | ICD-10-CM

## 2020-08-04 DIAGNOSIS — Z95828 Presence of other vascular implants and grafts: Secondary | ICD-10-CM

## 2020-08-04 MED ORDER — HEPARIN SOD (PORK) LOCK FLUSH 100 UNIT/ML IV SOLN
500.0000 [IU] | Freq: Once | INTRAVENOUS | Status: AC
  Administered 2020-08-04: 500 [IU]
  Filled 2020-08-04: qty 5

## 2020-08-04 MED ORDER — SODIUM CHLORIDE 0.9% FLUSH
10.0000 mL | Freq: Once | INTRAVENOUS | Status: AC
  Administered 2020-08-04: 10 mL
  Filled 2020-08-04: qty 10

## 2020-08-04 MED ORDER — SODIUM CHLORIDE 0.9 % IV SOLN
Freq: Once | INTRAVENOUS | Status: AC
  Filled 2020-08-04: qty 250

## 2020-08-04 NOTE — Patient Instructions (Signed)

## 2020-08-06 NOTE — Progress Notes (Signed)
[facility] [phone]   [phone]  PROGRESS NOTE  Patient Care Team: Wenda Low, MD as PCP - General (Internal Medicine) Malmfelt, Stephani Police, RN as Oncology Nurse Navigator Eppie Gibson, MD as Consulting Physician (Radiation Oncology) Orson Slick, MD as Consulting Physician (Hematology and Oncology) Sharen Counter, CCC-SLP as Speech Language Pathologist (Speech Pathology) Wynelle Beckmann, Melodie Bouillon, PT as Physical Therapist (Physical Therapy) Beverely Pace, LCSW as Social Worker (General Practice) Karie Mainland, RD as Dietitian (Nutrition) Izora Gala, MD as Consulting Physician (Otolaryngology)  Hematological/Oncological History 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 Mass biopsied at the right base of the tongue. Biopsy showed invasive moderately differentiated squamous cell carcinoma 3) 06/06/2020: PET CT scan showed hypermetabolic right tongue base mass consistent with known neoplasm. Right level 2 and level 3 metastatic adenopathy. No evidence of metastatic disease.  4) 06/09/2020: established care with Dr. Isidore Moos 5) 06/12/2020: establish care with Dr. Lorenso Courier  6) 07/03/2020: Week 1 of Cisplatin chemoradiation 7) 07/10/2020: Week 2 of Cisplatin chemoradiation 8) 07/17/2020: Week 3 of Cisplatin chemoradiation 9) 07/24/2020: Week 4 of Cisplatin chemoradiation 10) 07/31/2020: Week 5 of Cisplatin chemoradiation 11) 08/07/2020: Week 6 of Cisplatin chemoradiation  Interval History:  Sabien Umland 69 y.o. male with medical history significant for SCC of the base of the tongue who presents for a follow up visit. The patient's last visit was on 07/26/2020. In the interim since the last visit  he has had his 5th week of chemoradiation.  On exam today Mr. Colson reports that he is beginning to have more pain and that nausea is also becoming more prominent issue.  He reports that for the most part his nausea is been well controlled with Compazine earlier in the week with Zofran later in the week.  He notes that he does feel sick to his stomach does occasionally have some choking and gagging.  Unfortunately he did have an episode of emesis this morning.  This occurred after radiation he thinks it was due to the smell at the time.  He continues to have poor appetite and his weight is down to further 6 pounds from approximately 2 weeks ago.  On further discussion his pain is currently about a 4-10 in severity in his throat and the burning in his neck is about a 6 out of 10 in severity.  Today we will increase his pain medication per his request.  He is having a bowel movement every day and is taking it about 0% of his p.o. intake by mouth.  He is also unsure if he is taking in enough water.  He otherwise denies any fevers, chills, sweats, diarrhea, constipation, or other symptoms.  A full 10 point ROS is listed below.  MEDICAL HISTORY:  Past Medical History:  Diagnosis Date  . Hyperlipidemia   . Paroxysmal atrial fibrillation ([place])     SURGICAL HISTORY: Past Surgical History:  Procedure Laterality Date  . DIRECT LARYNGOSCOPY N/A 05/22/2020   Procedure: DIRECT LARYNGOSCOPY w/BIOPSY TONGUE BASE;  Surgeon: Izora Gala, MD;  Location: [facility];  Service: ENT;  Laterality: N/A;  . IR GASTROSTOMY TUBE MOD SED  06/26/2020  . IR IMAGING GUIDED PORT  INSERTION  06/26/2020  . TOTAL HIP ARTHROPLASTY  2012   right hip-Dr. Berenice Primas    SOCIAL HISTORY: Social History   Socioeconomic History  . Marital status: Significant Other    Spouse name: Not on file  . Number of children: 1  . Years of education: Not on file  . Highest education level: Not on file  Occupational History  .  Not on file  Tobacco Use  . Smoking status: Never Smoker  . Smokeless tobacco: Never Used  Vaping Use  . Vaping Use: Never used  Substance and Sexual Activity  . Alcohol use: Yes  . Drug use: No  . Sexual activity: Yes  Other Topics Concern  . Not on file  Social History Narrative  . Not on file   Social Determinants of Health   Financial Resource Strain: Low Risk   . Difficulty of Paying Living Expenses: Not hard at all  Food Insecurity: No Food Insecurity  . Worried About Charity fundraiser in the Last Year: Never true  . Ran Out of Food in the Last Year: Never true  Transportation Needs: No Transportation Needs  . Lack of Transportation (Medical): No  . Lack of Transportation (Non-Medical): No  Physical Activity:   . Days of Exercise per Week: Not on file  . Minutes of Exercise per Session: Not on file  Stress:   . Feeling of Stress : Not on file  Social Connections: Moderately Isolated  . Frequency of Communication with Friends and Family: More than three times a week  . Frequency of Social Gatherings with Friends and Family: More than three times a week  . Attends Religious Services: Never  . Active Member of Clubs or Organizations: No  . Attends Archivist Meetings: Not on file  . Marital Status: Married  Human resources officer Violence:   . Fear of Current or Ex-Partner: Not on file  . Emotionally Abused: Not on file  . Physically Abused: Not on file  . Sexually Abused: Not on file    FAMILY HISTORY: Family History  Problem Relation Age of Onset  . Hypertension Mother   . Heart disease Mother   . Heart disease Brother     ALLERGIES:  has No Known Allergies.  MEDICATIONS:  Current Outpatient Medications  Medication Sig Dispense Refill  . acetaminophen (TYLENOL) 325 MG tablet Take 650 mg by mouth every 6 (six) hours as needed.    Marland Kitchen amoxicillin (AMOXIL) 500 MG capsule Take four capsules one hour before dental appointment. 4 capsule 3  . aspirin 81  MG chewable tablet Chew by mouth daily.    Marland Kitchen atorvastatin (LIPITOR) 10 MG tablet Take 10 mg by mouth daily.    . Cholecalciferol (VITAMIN D3 SUPER STRENGTH) 50 MCG (2000 UT) TABS Take 2,000 Units by mouth.    . cyanocobalamin 1000 MCG tablet Take 1,000 mcg by mouth daily.    . folic acid (FOLVITE) 010 MCG tablet Take 400 mcg by mouth daily.    Marland Kitchen lidocaine (XYLOCAINE) 2 % solution Patient: Mix 1part 2% viscous lidocaine, 1part H20. Swish & swallow 110mL of diluted mixture, 33min before meals and at bedtime, up to QID 200 mL 5  . lidocaine-prilocaine (EMLA) cream Apply 1 application topically as needed. 30 g 0  . LORazepam (ATIVAN) 0.5 MG tablet Take 1 tab 30 min before radiotherapy, prn anxiety.  Will need a driver if taking this medication. 20 tablet 0  . melatonin 5 MG TABS Take 5 mg by  mouth at bedtime.    . Misc Natural Products (CHROMIUM PICOLINATE FORTIFIED PO) Take by mouth.    . Nutritional Supplements (ANTI-INFLAMMATORY ENZYME) CAPS Take by mouth. Recover AI    . Nutritional Supplements (KATE FARMS STANDARD 1.4) LIQD 1 Container by Enteral route 5 (five) times daily. 1625 mL 8  . ondansetron (ZOFRAN) 8 MG tablet Take 1 tablet (8 mg total) by mouth every 8 (eight) hours as needed for nausea or vomiting. 20 tablet 0  . oxyCODONE-acetaminophen (ROXICET) 5-325 MG/5ML solution Take 5 mLs by mouth every 6 (six) hours as needed for severe pain. 140 mL 0  . prochlorperazine (COMPAZINE) 10 MG tablet Take 1 tablet (10 mg total) by mouth every 6 (six) hours as needed for nausea or vomiting. 30 tablet 0  . Sennosides (SENNA) 8.8 MG/5ML LIQD Take 10 mLs by mouth 2 (two) times daily as needed. 105 mL 1  . sodium fluoride (PREVIDENT 5000 PLUS) 1.1 % CREA dental cream Apply to tooth brush. Brush teeth for 2 minutes. Spit out excess-DO NOT swallow. Repeat nightly. 102 g prn   No current facility-administered medications for this visit.   Facility-Administered Medications Ordered in Other Visits    Medication Dose Route Frequency Provider Last Rate Last Admin  . heparin lock flush 100 unit/mL  500 Units Intracatheter Once PRN Narda Rutherford T IV, MD      . sodium chloride 0.9 % 1,000 mL with potassium chloride 20 mEq, magnesium sulfate 2 g infusion   Intravenous Once Narda Rutherford T IV, MD      . sodium chloride flush (NS) 0.9 % injection 10 mL  10 mL Intracatheter PRN Orson Slick, MD      . Rennis Chris emulsion 1 application  1 application Topical BID Eppie Gibson, MD   1 application at 25/36/64 [phone]    REVIEW OF SYSTEMS:   Constitutional: ( - ) fevers, ( - )  chills , ( - ) night sweats Eyes: ( - ) blurriness of vision, ( - ) double vision, ( - ) watery eyes Ears, nose, mouth, throat, and face: ( - ) mucositis, ( - ) sore throat Respiratory: ( - ) cough, ( - ) dyspnea, ( - ) wheezes Cardiovascular: ( - ) palpitation, ( - ) chest discomfort, ( - ) lower extremity swelling Gastrointestinal:  ( - ) nausea, ( - ) heartburn, ( - ) change in bowel habits Skin: ( - ) abnormal skin rashes Lymphatics: ( - ) new lymphadenopathy, ( - ) easy bruising Neurological: ( - ) numbness, ( - ) tingling, ( - ) new weaknesses Behavioral/Psych: ( - ) mood change, ( - ) new changes  All other systems were reviewed with the patient and are negative.  PHYSICAL EXAMINATION: ECOG PERFORMANCE STATUS: 0 - Asymptomatic  Vitals:   08/07/20 0916  BP: 117/83  Pulse: 78  Resp: 18  Temp: (!) 97.3 F (36.3 C)  SpO2: 98%   Filed Weights   08/07/20 0916  Weight: 159 lb 1.6 oz (72.2 kg)    GENERAL: well appearing elderly Caucasian male alert, no distress and comfortable SKIN: skin color, texture, turgor are normal, no rashes or significant lesions. Worsening erythema/skin drying around neck.  EYES: conjunctiva are pink and non-injected, sclera clear OROPHARYNX: thick ropey saliva, but no erythema noted.  LUNGS: clear to auscultation and percussion with normal breathing effort HEART: regular rate &  rhythm and no murmurs and no lower extremity edema Musculoskeletal: no cyanosis of digits and  no clubbing  PSYCH: alert & oriented x 3, fluent speech NEURO: no focal motor/sensory deficits  LABORATORY DATA:  I have reviewed the data as listed CBC Latest Ref Rng & Units 08/07/2020 07/31/2020 07/24/2020  WBC 4.0 - 10.5 K/uL 4.9 5.1 5.3  Hemoglobin 13.0 - 17.0 g/dL 12.3(L) 12.2(L) 13.8  Hematocrit 39 - 52 % 34.9(L) 35.4(L) 38.4(L)  Platelets 150 - 400 K/uL 149(L) 152 193    CMP Latest Ref Rng & Units 08/07/2020 07/31/2020 07/24/2020  Glucose 70 - 99 mg/dL 118(H) 112(H) 155(H)  BUN 8 - 23 mg/dL 23 18 22   Creatinine 0.61 - 1.24 mg/dL 0.83 0.78 0.98  Sodium 135 - 145 mmol/L 130(L) 131(L) 132(L)  Potassium 3.5 - 5.1 mmol/L 4.5 4.4 4.3  Chloride 98 - 111 mmol/L 97(L) 99 99  CO2 22 - 32 mmol/L 25 25 27   Calcium 8.9 - 10.3 mg/dL 9.2 9.1 9.8  Total Protein 6.5 - 8.1 g/dL 6.1(L) 5.7(L) 6.2(L)  Total Bilirubin 0.3 - 1.2 mg/dL 0.5 0.5 0.5  Alkaline Phos 38 - 126 U/L 80 82 89  AST 15 - 41 U/L 15 15 15   ALT 0 - 44 U/L 13 15 17     RADIOGRAPHIC STUDIES: No results found.  ASSESSMENT & PLAN Patrick Nelson 70 y.o. male with medical history significant for SCC of the base of the tongue who presents for a follow up visit.  Review the labs, review the records, discussion with the patient the findings are most consistent with a squamous cell carcinoma of the back base of the tongue.  This was a T1 N1 M0 p16 positive cancer which makes it stage I.  Overall the prognosis for this is quite well and there is an excellent chance of durable remission after chemoradiation therapy with cisplatin.   On exam today Mr. Chamberlin notes he is having worsening pain and erythema around his neck.  He is also having an increase in nausea.  Today we will add on a liquid pain medication as well as liquid stool softener in order to help get him through the next 2 weeks of treatment.  He is willing and able to proceed with  chemotherapy at this time.  Previously we discussed treatment moving forward and the expected side effects and symptoms.  Side effects include but are not limited to fatigue, anemia, kidney dysfunction, nausea, vomiting, diarrhea, throat pain, weight loss, and neuropathy.  Patient his wife were agreeable to proceeding with treatment.  We also discussed the medications to help with potential side effects including Zofran as needed, Compazine as needed, and EMLA cream.  He is not currently in pain and therefore we have not yet started pain medication but we will begin this once he begins to experience throat pain.  The treatment of choice for this patient will be radiation with concurrent cisplatin 40 mg per metered squared weekly x7 doses.  We started this on 07/03/2020 and have weekly visits with the patient while he is receiving this treatment.  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 P16+, Stage I  --OK to proceed with chemoradiation Week 6 today. Plan for 7 weekly doses of cisplatin 40mg /m2 to be administered with radiation --supportive care as noted below. --plan for weekly visits in clinic with labs --weekly CMP, CBC. Periodic TSH while receiving radiation.  --plan to see patient between Week 6 of chemotherapy on 08/07/2020.   #Pain Control --today will prescribe liquid roxicet 85ml q 6H PRN  #Supportive Care --  patient provided with zofran 8mg  q8H PRN and compazine 10mg  q6H PRN. He has not yet required these --EMLA cream for port provided --PEG and Port in place. Appreciate assistance from dietary regarding tube feeds --patient not currently in pain, will prescribe medication when symptoms begin.   No orders of the defined types were placed in this encounter.  All questions were answered. The patient knows to call the clinic with any problems, questions or concerns.  A total of more than 30 minutes were spent on this encounter and over half of that time was  spent on counseling and coordination of care as outlined above.   Ledell Peoples, MD Department of Hematology/Oncology [facility] Phone: [phone] Pager: [phone] Email: [email or web address]  08/07/2020 10:21 AM

## 2020-08-07 ENCOUNTER — Inpatient Hospital Stay: Payer: Medicare Other

## 2020-08-07 ENCOUNTER — Other Ambulatory Visit: Payer: Self-pay

## 2020-08-07 ENCOUNTER — Inpatient Hospital Stay: Payer: Medicare Other | Admitting: Nutrition

## 2020-08-07 ENCOUNTER — Inpatient Hospital Stay: Payer: Medicare Other | Admitting: Hematology and Oncology

## 2020-08-07 ENCOUNTER — Ambulatory Visit
Admission: RE | Admit: 2020-08-07 | Discharge: 2020-08-07 | Disposition: A | Discharge status: Home / Self Care | Payer: Medicare Other | Source: Ambulatory Visit | Attending: Radiation Oncology | Admitting: Radiation Oncology

## 2020-08-07 ENCOUNTER — Encounter: Payer: Self-pay | Admitting: Hematology and Oncology

## 2020-08-07 VITALS — BP 117/83 | HR 78 | Temp 97.3°F | Resp 18 | Ht 72.0 in | Wt 159.1 lb

## 2020-08-07 DIAGNOSIS — I480 Paroxysmal atrial fibrillation: Secondary | ICD-10-CM

## 2020-08-07 DIAGNOSIS — Z95828 Presence of other vascular implants and grafts: Secondary | ICD-10-CM

## 2020-08-07 DIAGNOSIS — C01 Malignant neoplasm of base of tongue: Secondary | ICD-10-CM

## 2020-08-07 DIAGNOSIS — Z5111 Encounter for antineoplastic chemotherapy: Secondary | ICD-10-CM | POA: Diagnosis not present

## 2020-08-07 LAB — CMP (CANCER CENTER ONLY)
ALT: 13 U/L (ref 0–44)
AST: 15 U/L (ref 15–41)
Albumin: 3.3 g/dL — ABNORMAL LOW (ref 3.5–5.0)
Alkaline Phosphatase: 80 U/L (ref 38–126)
Anion gap: 8 (ref 5–15)
BUN: 23 mg/dL (ref 8–23)
CO2: 25 mmol/L (ref 22–32)
Calcium: 9.2 mg/dL (ref 8.9–10.3)
Chloride: 97 mmol/L — ABNORMAL LOW (ref 98–111)
Creatinine: 0.83 mg/dL (ref 0.61–1.24)
GFR, Estimated: >60 mL/min (ref >60)
Glucose, Bld: 118 mg/dL — ABNORMAL HIGH (ref 70–99)
Potassium: 4.5 mmol/L (ref 3.5–5.1)
Sodium: 130 mmol/L — ABNORMAL LOW (ref 135–145)
Total Bilirubin: 0.5 mg/dL (ref 0.3–1.2)
Total Protein: 6.1 g/dL — ABNORMAL LOW (ref 6.5–8.1)

## 2020-08-07 LAB — CBC WITH DIFFERENTIAL (CANCER CENTER ONLY)
Abs Immature Granulocytes: 0.03 10*3/uL (ref 0.00–0.07)
Basophils Absolute: 0 10*3/uL (ref 0.0–0.1)
Basophils Relative: 0 %
Eosinophils Absolute: 0 10*3/uL (ref 0.0–0.5)
Eosinophils Relative: 0 %
HCT: 34.9 % — ABNORMAL LOW (ref 39.0–52.0)
Hemoglobin: 12.3 g/dL — ABNORMAL LOW (ref 13.0–17.0)
Immature Granulocytes: 1 %
Lymphocytes Relative: 8 %
Lymphs Abs: 0.4 10*3/uL — ABNORMAL LOW (ref 0.7–4.0)
MCH: 30.5 pg (ref 26.0–34.0)
MCHC: 35.2 g/dL (ref 30.0–36.0)
MCV: 86.6 fL (ref 80.0–100.0)
Monocytes Absolute: 0.6 10*3/uL (ref 0.1–1.0)
Monocytes Relative: 12 %
Neutro Abs: 3.9 10*3/uL (ref 1.7–7.7)
Neutrophils Relative %: 79 %
Platelet Count: 149 10*3/uL — ABNORMAL LOW (ref 150–400)
RBC: 4.03 MIL/uL — ABNORMAL LOW (ref 4.22–5.81)
RDW: 13.3 % (ref 11.5–15.5)
WBC Count: 4.9 10*3/uL (ref 4.0–10.5)
nRBC: 0 % (ref 0.0–0.2)

## 2020-08-07 LAB — MAGNESIUM: Magnesium: 1.9 mg/dL (ref 1.7–2.4)

## 2020-08-07 MED ORDER — SENNA 8.8 MG/5ML PO LIQD: 10.0000 mL | Freq: Two times a day (BID) | ORAL | 1 refills | Status: DC | PRN

## 2020-08-07 MED ORDER — SODIUM CHLORIDE 0.9 % IV SOLN
150.0000 mg | Freq: Once | INTRAVENOUS | Status: AC
  Administered 2020-08-07: 150 mg via INTRAVENOUS
  Filled 2020-08-07: qty 150

## 2020-08-07 MED ORDER — SODIUM CHLORIDE 0.9 % IV SOLN
40.0000 mg/m2 | Freq: Once | INTRAVENOUS | Status: AC
  Administered 2020-08-07: 81 mg via INTRAVENOUS
  Filled 2020-08-07: qty 81

## 2020-08-07 MED ORDER — SONAFINE EX EMUL
1.0000 "application " | Freq: Two times a day (BID) | CUTANEOUS | Status: DC
  Administered 2020-08-07: 1 via TOPICAL

## 2020-08-07 MED ORDER — SODIUM CHLORIDE 0.9% FLUSH
10.0000 mL | INTRAVENOUS | Status: DC | PRN
  Administered 2020-08-07: 10 mL
  Filled 2020-08-07: qty 10

## 2020-08-07 MED ORDER — SODIUM CHLORIDE 0.9 % IV SOLN
10.0000 mg | Freq: Once | INTRAVENOUS | Status: AC
  Administered 2020-08-07: 10 mg via INTRAVENOUS
  Filled 2020-08-07: qty 10

## 2020-08-07 MED ORDER — OXYCODONE-ACETAMINOPHEN 5-325 MG/5ML PO SOLN: 5.0000 mL | Freq: Four times a day (QID) | ORAL | 0 refills | Status: DC | PRN

## 2020-08-07 MED ORDER — PALONOSETRON HCL INJECTION 0.25 MG/5ML
0.2500 mg | Freq: Once | INTRAVENOUS | Status: AC
  Administered 2020-08-07: 0.25 mg via INTRAVENOUS

## 2020-08-07 MED ORDER — PALONOSETRON HCL INJECTION 0.25 MG/5ML
INTRAVENOUS | Status: AC
  Filled 2020-08-07: qty 5

## 2020-08-07 MED ORDER — SODIUM CHLORIDE 0.9% FLUSH
10.0000 mL | Freq: Once | INTRAVENOUS | Status: AC
  Administered 2020-08-07: 10 mL
  Filled 2020-08-07: qty 10

## 2020-08-07 MED ORDER — SODIUM CHLORIDE 0.9 % IV SOLN
Freq: Once | INTRAVENOUS | Status: AC
  Filled 2020-08-07: qty 10

## 2020-08-07 MED ORDER — HEPARIN SOD (PORK) LOCK FLUSH 100 UNIT/ML IV SOLN
500.0000 [IU] | Freq: Once | INTRAVENOUS | Status: AC | PRN
  Administered 2020-08-07: 500 [IU]
  Filled 2020-08-07: qty 5

## 2020-08-07 MED ORDER — SODIUM CHLORIDE 0.9 % IV SOLN
Freq: Once | INTRAVENOUS | Status: AC
  Filled 2020-08-07: qty 250

## 2020-08-07 NOTE — Patient Instructions (Signed)

## 2020-08-07 NOTE — Patient Instructions (Signed)
[facility] Discharge Instructions for Patients Receiving Chemotherapy  Today you received the following chemotherapy agent: Cisplatin  To help prevent nausea and vomiting after your treatment, we encourage you to take your nausea medication as directed by your MD.   If you develop nausea and vomiting that is not controlled by your nausea medication, call the clinic.   BELOW ARE SYMPTOMS THAT SHOULD BE REPORTED IMMEDIATELY:  *FEVER GREATER THAN 100.5 F  *CHILLS WITH OR WITHOUT FEVER  NAUSEA AND VOMITING THAT IS NOT CONTROLLED WITH YOUR NAUSEA MEDICATION  *UNUSUAL SHORTNESS OF BREATH  *UNUSUAL BRUISING OR BLEEDING  TENDERNESS IN MOUTH AND THROAT WITH OR WITHOUT PRESENCE OF ULCERS  *URINARY PROBLEMS  *BOWEL PROBLEMS  UNUSUAL RASH Items with * indicate a potential emergency and should be followed up as soon as possible.  Feel free to call the clinic should you have any questions or concerns. The clinic phone number is [phone].  Please show the [facility] at check-in to the Emergency Department and triage nurse.

## 2020-08-07 NOTE — Progress Notes (Signed)
Okay for patient to proceed with treatment with 130ml urine output.

## 2020-08-07 NOTE — Progress Notes (Signed)
Nutrition follow-up completed with patient during infusion for right tongue cancer. He is receiving concurrent chemoradiation therapy. Weight continues to decrease and was documented as 159.1 pounds November 8 down from 177.25 pounds September 10. Noted labs: Sodium 130, glucose 118, albumin 3.3. Patient reports he did have an episode of vomiting but feels it was due to gagging and thick saliva. He denies nausea, constipation, and diarrhea. Reports he cannot eat or drink by mouth at this time. Reports only using 4 cartons of [employer] 1.4 daily and has experienced weight loss.  Estimated nutrition needs: 2100-2300 cal, 92-107 g protein, 2.2 L fluid.  Nutrition diagnosis: Inadequate oral intake continues.  Intervention: Strongly encourage patient to work on tolerating fifth carton of [employer] 1.4 daily.  Suggested patient try 1 carton every 3 hours or split up fifth carton into original 4 feedings daily. Continue medications as needed.  5 cartons [employer] 1.4+ free water flushes provides 2275 cal, 100 g protein, 2370 mL free water.  This would be a 100% estimated nutrition needs.  Monitoring, evaluation, goals: Patient will work to increase tube feeding to goal rate for weight maintenance.  Next visit: Monday, November 15 during infusion.  **Disclaimer: This note was dictated with voice recognition software. Similar sounding words can inadvertently be transcribed and this note may contain transcription errors which may not have been corrected upon publication of note.**

## 2020-08-08 ENCOUNTER — Other Ambulatory Visit: Payer: Self-pay | Admitting: *Deleted

## 2020-08-08 ENCOUNTER — Other Ambulatory Visit: Payer: Self-pay | Admitting: Hematology and Oncology

## 2020-08-08 ENCOUNTER — Telehealth: Payer: Self-pay | Admitting: *Deleted

## 2020-08-08 ENCOUNTER — Ambulatory Visit
Admission: RE | Admit: 2020-08-08 | Discharge: 2020-08-08 | Disposition: A | Discharge status: Home / Self Care | Payer: Medicare Other | Source: Ambulatory Visit | Attending: Radiation Oncology | Admitting: Radiation Oncology

## 2020-08-08 DIAGNOSIS — C01 Malignant neoplasm of base of tongue: Secondary | ICD-10-CM | POA: Diagnosis not present

## 2020-08-08 MED ORDER — OXYCODONE HCL 5 MG/5ML PO SOLN
5.0000 mg | Freq: Four times a day (QID) | ORAL | 0 refills | Status: DC | PRN
Start: 2020-08-08 — End: 2020-11-28

## 2020-08-08 MED FILL — oxyCODONE HCL 5 MG/5ML SOLN: 5 | 10 days supply | Qty: 200 | Fill #0

## 2020-08-08 NOTE — Progress Notes (Signed)
Orders only

## 2020-08-08 NOTE — Telephone Encounter (Signed)
Wife called about liquid pain medication, stated not at pharmacy.   [facility] and confirmed with them no receipt on their end even though we have electronic confirmation.  They also advised that the manufacturer no longer makes Oxycodone/Acetaminophen combination.  This will need to be changed to Oxycodone 5mg /38ml and if he still wants Acetaminophen separately they would have to order that as well.   Paged Dr Lorenso Courier to review and reorder appropriately.

## 2020-08-09 ENCOUNTER — Inpatient Hospital Stay: Payer: Medicare Other

## 2020-08-09 ENCOUNTER — Ambulatory Visit
Admission: RE | Admit: 2020-08-09 | Discharge: 2020-08-09 | Disposition: A | Discharge status: Home / Self Care | Payer: Medicare Other | Source: Ambulatory Visit | Attending: Radiation Oncology | Admitting: Radiation Oncology

## 2020-08-09 ENCOUNTER — Other Ambulatory Visit: Payer: Self-pay | Admitting: Hematology and Oncology

## 2020-08-09 ENCOUNTER — Other Ambulatory Visit: Payer: Self-pay

## 2020-08-09 VITALS — BP 117/81 | HR 62 | Temp 98.4°F | Resp 18 | Wt 160.1 lb

## 2020-08-09 DIAGNOSIS — C01 Malignant neoplasm of base of tongue: Secondary | ICD-10-CM | POA: Diagnosis not present

## 2020-08-09 DIAGNOSIS — Z95828 Presence of other vascular implants and grafts: Secondary | ICD-10-CM

## 2020-08-09 DIAGNOSIS — Z5111 Encounter for antineoplastic chemotherapy: Secondary | ICD-10-CM | POA: Diagnosis not present

## 2020-08-09 MED ORDER — HEPARIN SOD (PORK) LOCK FLUSH 100 UNIT/ML IV SOLN
250.0000 [IU] | Freq: Once | INTRAVENOUS | Status: DC
  Filled 2020-08-09: qty 5

## 2020-08-09 MED ORDER — HEPARIN SOD (PORK) LOCK FLUSH 100 UNIT/ML IV SOLN
500.0000 [IU] | Freq: Once | INTRAVENOUS | Status: AC
  Administered 2020-08-09: 500 [IU]
  Filled 2020-08-09: qty 5

## 2020-08-09 MED ORDER — SODIUM CHLORIDE 0.9% FLUSH
10.0000 mL | Freq: Once | INTRAVENOUS | Status: AC
  Administered 2020-08-09: 10 mL
  Filled 2020-08-09: qty 10

## 2020-08-09 MED ORDER — SODIUM CHLORIDE 0.9 % IV SOLN
INTRAVENOUS | Status: DC
  Filled 2020-08-09 (×2): qty 250

## 2020-08-09 MED ORDER — SONAFINE EX EMUL
1.0000 "application " | Freq: Two times a day (BID) | CUTANEOUS | Status: DC
  Administered 2020-08-09: 1 via TOPICAL

## 2020-08-09 NOTE — Patient Instructions (Signed)

## 2020-08-10 ENCOUNTER — Ambulatory Visit
Admission: RE | Admit: 2020-08-10 | Discharge: 2020-08-10 | Disposition: A | Discharge status: Home / Self Care | Payer: Medicare Other | Source: Ambulatory Visit | Attending: Radiation Oncology | Admitting: Radiation Oncology

## 2020-08-10 ENCOUNTER — Other Ambulatory Visit: Payer: Self-pay

## 2020-08-10 ENCOUNTER — Inpatient Hospital Stay: Payer: Medicare Other

## 2020-08-10 DIAGNOSIS — C01 Malignant neoplasm of base of tongue: Secondary | ICD-10-CM | POA: Diagnosis not present

## 2020-08-10 DIAGNOSIS — Z5111 Encounter for antineoplastic chemotherapy: Secondary | ICD-10-CM | POA: Diagnosis not present

## 2020-08-10 DIAGNOSIS — Z95828 Presence of other vascular implants and grafts: Secondary | ICD-10-CM

## 2020-08-10 MED ORDER — SODIUM CHLORIDE 0.9% FLUSH
10.0000 mL | Freq: Once | INTRAVENOUS | Status: AC
  Administered 2020-08-10: 10 mL
  Filled 2020-08-10: qty 10

## 2020-08-10 MED ORDER — SODIUM CHLORIDE 0.9 % IV SOLN
INTRAVENOUS | Status: AC
  Filled 2020-08-10 (×2): qty 250

## 2020-08-10 MED ORDER — HEPARIN SOD (PORK) LOCK FLUSH 100 UNIT/ML IV SOLN
500.0000 [IU] | Freq: Once | INTRAVENOUS | Status: AC
  Administered 2020-08-10: 500 [IU]
  Filled 2020-08-10: qty 5

## 2020-08-11 ENCOUNTER — Ambulatory Visit: Payer: Medicare Other | Attending: Radiation Oncology

## 2020-08-11 ENCOUNTER — Ambulatory Visit
Admission: RE | Admit: 2020-08-11 | Discharge: 2020-08-11 | Disposition: A | Discharge status: Home / Self Care | Payer: Medicare Other | Source: Ambulatory Visit | Attending: Radiation Oncology | Admitting: Radiation Oncology

## 2020-08-11 DIAGNOSIS — C01 Malignant neoplasm of base of tongue: Secondary | ICD-10-CM | POA: Diagnosis not present

## 2020-08-12 NOTE — Progress Notes (Signed)
[facility] [phone]   [phone]  PROGRESS NOTE  Patient Care Team: Wenda Low, MD as PCP - General (Internal Medicine) Malmfelt, Stephani Police, RN as Oncology Nurse Navigator Eppie Gibson, MD as Consulting Physician (Radiation Oncology) Orson Slick, MD as Consulting Physician (Hematology and Oncology) Sharen Counter, CCC-SLP as Speech Language Pathologist (Speech Pathology) Wynelle Beckmann, Melodie Bouillon, PT as Physical Therapist (Physical Therapy) Beverely Pace, LCSW as Social Worker (General Practice) Karie Mainland, RD as Dietitian (Nutrition) Izora Gala, MD as Consulting Physician (Otolaryngology)  Hematological/Oncological History 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 Mass biopsied at the right base of the tongue. Biopsy showed invasive moderately differentiated squamous cell carcinoma 3) 06/06/2020: PET CT scan showed hypermetabolic right tongue base mass consistent with known neoplasm. Right level 2 and level 3 metastatic adenopathy. No evidence of metastatic disease.  4) 06/09/2020: established care with Dr. Isidore Moos 5) 06/12/2020: establish care with Dr. Lorenso Courier  6) 07/03/2020: Week 1 of Cisplatin chemoradiation 7) 07/10/2020: Week 2 of Cisplatin chemoradiation 8) 07/17/2020: Week 3 of Cisplatin chemoradiation 9) 07/24/2020: Week 4 of Cisplatin chemoradiation 10) 07/31/2020: Week 5 of Cisplatin chemoradiation 11) 08/07/2020: Week 6 of Cisplatin chemoradiation 12) 08/14/2020: Week 7 of Cisplatin chemoradiation  Interval History:  Kaysin Brock 69 y.o. male with medical history significant for SCC of the base of the tongue who presents for a follow up visit. The patient's last visit  was on 11/8//2021. In the interim since the last visit he has had his 6th week of chemoradiation.  On exam today Mr. Junious is accompanied by his wife.  He reports that he is taking 100% of his nutrition by G-tube, though he is taking in some of his medications including his nausea medication by mouth.  He reports that he has trouble with gagging and pain while trying to swallow.  Unfortunately he has not been taking his pain medication as he does not feel that he needs it.    He has been having trouble with high volume of intake from his G-tube as if he has any more than 3 or 4 cans he begins having issues with nausea and vomiting.  He reports otherwise he is having normal bowel movements.  The rash around his neck remains erythematous, dry, and tender but he has been using the cream to good effect.  He otherwise denies having issues with fevers, chills, sweats, chest pain, bleeding, or other symptoms.  MEDICAL HISTORY:  Past Medical History:  Diagnosis Date  . Hyperlipidemia   . Paroxysmal atrial fibrillation ([place])     SURGICAL HISTORY: Past Surgical History:  Procedure Laterality Date  . DIRECT LARYNGOSCOPY N/A 05/22/2020   Procedure: DIRECT LARYNGOSCOPY w/BIOPSY TONGUE BASE;  Surgeon: Izora Gala, MD;  Location: [facility];  Service: ENT;  Laterality: N/A;  . IR GASTROSTOMY TUBE MOD SED  06/26/2020  . IR IMAGING GUIDED PORT INSERTION  06/26/2020  . TOTAL HIP ARTHROPLASTY  2012   right hip-Dr. Berenice Primas    SOCIAL HISTORY: Social History   Socioeconomic History  . Marital status: Significant Other    Spouse name: Not on file  . Number of children: 1  . Years of education:  Not on file  . Highest education level: Not on file  Occupational History  . Not on file  Tobacco Use  . Smoking status: Never Smoker  . Smokeless tobacco: Never Used  Vaping Use  . Vaping Use: Never used  Substance and Sexual Activity  . Alcohol use: Yes  . Drug use: No  . Sexual activity: Yes   Other Topics Concern  . Not on file  Social History Narrative  . Not on file   Social Determinants of Health   Financial Resource Strain: Low Risk   . Difficulty of Paying Living Expenses: Not hard at all  Food Insecurity: No Food Insecurity  . Worried About Charity fundraiser in the Last Year: Never true  . Ran Out of Food in the Last Year: Never true  Transportation Needs: No Transportation Needs  . Lack of Transportation (Medical): No  . Lack of Transportation (Non-Medical): No  Physical Activity:   . Days of Exercise per Week: Not on file  . Minutes of Exercise per Session: Not on file  Stress:   . Feeling of Stress : Not on file  Social Connections: Moderately Isolated  . Frequency of Communication with Friends and Family: More than three times a week  . Frequency of Social Gatherings with Friends and Family: More than three times a week  . Attends Religious Services: Never  . Active Member of Clubs or Organizations: No  . Attends Archivist Meetings: Not on file  . Marital Status: Married  Human resources officer Violence:   . Fear of Current or Ex-Partner: Not on file  . Emotionally Abused: Not on file  . Physically Abused: Not on file  . Sexually Abused: Not on file    FAMILY HISTORY: Family History  Problem Relation Age of Onset  . Hypertension Mother   . Heart disease Mother   . Heart disease Brother     ALLERGIES:  has No Known Allergies.  MEDICATIONS:  Current Outpatient Medications  Medication Sig Dispense Refill  . acetaminophen (TYLENOL) 325 MG tablet Take 650 mg by mouth every 6 (six) hours as needed.    Marland Kitchen amoxicillin (AMOXIL) 500 MG capsule Take four capsules one hour before dental appointment. 4 capsule 3  . aspirin 81 MG chewable tablet Chew by mouth daily.    Marland Kitchen atorvastatin (LIPITOR) 10 MG tablet Take 10 mg by mouth daily.    . Cholecalciferol (VITAMIN D3 SUPER STRENGTH) 50 MCG (2000 UT) TABS Take 2,000 Units by mouth.    . cyanocobalamin  1000 MCG tablet Take 1,000 mcg by mouth daily.    . folic acid (FOLVITE) 841 MCG tablet Take 400 mcg by mouth daily.    Marland Kitchen lidocaine (XYLOCAINE) 2 % solution Patient: Mix 1part 2% viscous lidocaine, 1part H20. Swish & swallow 58mL of diluted mixture, 33min before meals and at bedtime, up to QID 200 mL 5  . lidocaine-prilocaine (EMLA) cream Apply 1 application topically as needed. 30 g 0  . LORazepam (ATIVAN) 0.5 MG tablet Take 1 tab 30 min before radiotherapy, prn anxiety.  Will need a driver if taking this medication. 20 tablet 0  . melatonin 5 MG TABS Take 5 mg by mouth at bedtime.    . Misc Natural Products (CHROMIUM PICOLINATE FORTIFIED PO) Take by mouth.    . Nutritional Supplements (ANTI-INFLAMMATORY ENZYME) CAPS Take by mouth. Recover AI    . Nutritional Supplements (KATE FARMS STANDARD 1.4) LIQD 1 Container by Enteral route 5 (five) times daily.  1625 mL 8  . ondansetron (ZOFRAN) 8 MG tablet Take 1 tablet (8 mg total) by mouth every 8 (eight) hours as needed for nausea or vomiting. 20 tablet 0  . oxyCODONE (ROXICODONE) 5 MG/5ML solution Take 5 mLs (5 mg total) by mouth every 6 (six) hours as needed for severe pain. 200 mL 0  . prochlorperazine (COMPAZINE) 10 MG tablet Take 1 tablet (10 mg total) by mouth every 6 (six) hours as needed for nausea or vomiting. 30 tablet 0  . Sennosides (SENNA) 8.8 MG/5ML LIQD Take 10 mLs by mouth 2 (two) times daily as needed. 105 mL 1  . sodium fluoride (PREVIDENT 5000 PLUS) 1.1 % CREA dental cream Apply to tooth brush. Brush teeth for 2 minutes. Spit out excess-DO NOT swallow. Repeat nightly. 102 g prn   No current facility-administered medications for this visit.    REVIEW OF SYSTEMS:   Constitutional: ( - ) fevers, ( - )  chills , ( - ) night sweats Eyes: ( - ) blurriness of vision, ( - ) double vision, ( - ) watery eyes Ears, nose, mouth, throat, and face: ( - ) mucositis, ( - ) sore throat Respiratory: ( - ) cough, ( - ) dyspnea, ( - ) wheezes  Cardiovascular: ( - ) palpitation, ( - ) chest discomfort, ( - ) lower extremity swelling Gastrointestinal:  ( - ) nausea, ( - ) heartburn, ( - ) change in bowel habits Skin: ( - ) abnormal skin rashes Lymphatics: ( - ) new lymphadenopathy, ( - ) easy bruising Neurological: ( - ) numbness, ( - ) tingling, ( - ) new weaknesses Behavioral/Psych: ( - ) mood change, ( - ) new changes  All other systems were reviewed with the patient and are negative.  PHYSICAL EXAMINATION: ECOG PERFORMANCE STATUS: 0 - Asymptomatic  Vitals:   08/14/20 0833  BP: 112/83  Pulse: 96  Resp: 17  Temp: 97.6 F (36.4 C)  SpO2: 99%   Filed Weights   08/14/20 0833  Weight: 155 lb 8 oz (70.5 kg)    GENERAL: well appearing elderly Caucasian male alert, no distress and comfortable SKIN:  Worsening erythema/skin drying around neck.  EYES: conjunctiva are pink and non-injected, sclera clear OROPHARYNX: thick ropey saliva, but no erythema noted.  LUNGS: clear to auscultation and percussion with normal breathing effort HEART: regular rate & rhythm and no murmurs and no lower extremity edema Musculoskeletal: no cyanosis of digits and no clubbing  PSYCH: alert & oriented x 3, fluent speech NEURO: no focal motor/sensory deficits  LABORATORY DATA:  I have reviewed the data as listed CBC Latest Ref Rng & Units 08/14/2020 08/07/2020 07/31/2020  WBC 4.0 - 10.5 K/uL 2.9(L) 4.9 5.1  Hemoglobin 13.0 - 17.0 g/dL 11.6(L) 12.3(L) 12.2(L)  Hematocrit 39 - 52 % 33.0(L) 34.9(L) 35.4(L)  Platelets 150 - 400 K/uL 144(L) 149(L) 152    CMP Latest Ref Rng & Units 08/14/2020 08/07/2020 07/31/2020  Glucose 70 - 99 mg/dL 112(H) 118(H) 112(H)  BUN 8 - 23 mg/dL 20 23 18   Creatinine 0.61 - 1.24 mg/dL 0.81 0.83 0.78  Sodium 135 - 145 mmol/L 130(L) 130(L) 131(L)  Potassium 3.5 - 5.1 mmol/L 4.2 4.5 4.4  Chloride 98 - 111 mmol/L 97(L) 97(L) 99  CO2 22 - 32 mmol/L 25 25 25   Calcium 8.9 - 10.3 mg/dL 9.4 9.2 9.1  Total Protein 6.5 - 8.1  g/dL 6.2(L) 6.1(L) 5.7(L)  Total Bilirubin 0.3 - 1.2 mg/dL 0.8 0.5 0.5  Alkaline Phos 38 - 126 U/L 98 80 82  AST 15 - 41 U/L 14(L) 15 15  ALT 0 - 44 U/L 13 13 15     RADIOGRAPHIC STUDIES: No results found.  ASSESSMENT & PLAN Liberato Stansbery 69 y.o. male with medical history significant for SCC of the base of the tongue who presents for a follow up visit.  Review the labs, review the records, discussion with the patient the findings are most consistent with a squamous cell carcinoma of the back base of the tongue.  This was a T1 N1 M0 p16 positive cancer which makes it stage I.  Overall the prognosis for this is quite well and there is an excellent chance of durable remission after chemoradiation therapy with cisplatin.   On exam today Mr. Gudino is taking in 100% of his nutrition via G-tube and has been declining to take his pain medication.  He is not having any fevers, chills, sweats.  His labs are stable and he is able to proceed with his final dose of chemotherapy today.  Previously we discussed treatment moving forward and the expected side effects and symptoms.  Side effects include but are not limited to fatigue, anemia, kidney dysfunction, nausea, vomiting, diarrhea, throat pain, weight loss, and neuropathy.  Patient his wife were agreeable to proceeding with treatment.  We also discussed the medications to help with potential side effects including Zofran as needed, Compazine as needed, and EMLA cream.  He is not currently in pain and therefore we have not yet started pain medication but we will begin this once he begins to experience throat pain.  The treatment of choice for this patient will be radiation with concurrent cisplatin 40 mg per metered squared weekly x7 doses.  We started this on 07/03/2020 and have weekly visits with the patient while he is receiving this treatment.  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 P16+, Stage I  --OK to proceed with  chemoradiation Week 7 today. Plan for 7 weekly doses of cisplatin 40mg /m2 to be administered with radiation. Today is the last dose of chemo. --supportive care as noted below. --plan for weekly visits in clinic with labs --weekly CMP, CBC. Periodic TSH while receiving radiation.  --plan to see patient in 1 weeks time.   #Pain Control --today will prescribe liquid roxicet 38ml q 6H PRN  #Supportive Care --patient provided with zofran 8mg  q8H PRN and compazine 10mg  q6H PRN. He has not yet required these --EMLA cream for port provided --PEG and Port in place. Appreciate assistance from dietary regarding tube feeds --patient not currently in pain, will prescribe medication when symptoms begin.   No orders of the defined types were placed in this encounter.  All questions were answered. The patient knows to call the clinic with any problems, questions or concerns.  A total of more than 30 minutes were spent on this encounter and over half of that time was spent on counseling and coordination of care as outlined above.   Ledell Peoples, MD Department of Hematology/Oncology [place] at [facility] Phone: [phone] Pager: [phone] Email: [email or web address]  08/14/2020 9:17 AM

## 2020-08-14 ENCOUNTER — Ambulatory Visit
Admission: RE | Admit: 2020-08-14 | Discharge: 2020-08-14 | Disposition: A | Discharge status: Home / Self Care | Payer: Medicare Other | Source: Ambulatory Visit | Attending: Radiation Oncology | Admitting: Radiation Oncology

## 2020-08-14 ENCOUNTER — Inpatient Hospital Stay: Payer: Medicare Other

## 2020-08-14 ENCOUNTER — Encounter: Payer: Self-pay | Admitting: Hematology and Oncology

## 2020-08-14 ENCOUNTER — Other Ambulatory Visit: Payer: Self-pay | Admitting: *Deleted

## 2020-08-14 ENCOUNTER — Inpatient Hospital Stay: Payer: Medicare Other | Admitting: Hematology and Oncology

## 2020-08-14 ENCOUNTER — Other Ambulatory Visit: Payer: Self-pay | Admitting: Hematology and Oncology

## 2020-08-14 ENCOUNTER — Inpatient Hospital Stay: Payer: Medicare Other | Admitting: Nutrition

## 2020-08-14 ENCOUNTER — Other Ambulatory Visit: Payer: Self-pay

## 2020-08-14 VITALS — BP 112/83 | HR 96 | Temp 97.6°F | Resp 17 | Ht 72.0 in | Wt 155.5 lb

## 2020-08-14 DIAGNOSIS — Z95828 Presence of other vascular implants and grafts: Secondary | ICD-10-CM

## 2020-08-14 DIAGNOSIS — C01 Malignant neoplasm of base of tongue: Secondary | ICD-10-CM | POA: Diagnosis not present

## 2020-08-14 DIAGNOSIS — Z5111 Encounter for antineoplastic chemotherapy: Secondary | ICD-10-CM | POA: Diagnosis not present

## 2020-08-14 LAB — MAGNESIUM: Magnesium: 1.7 mg/dL (ref 1.7–2.4)

## 2020-08-14 LAB — CMP (CANCER CENTER ONLY)
ALT: 13 U/L (ref 0–44)
AST: 14 U/L — ABNORMAL LOW (ref 15–41)
Albumin: 3.3 g/dL — ABNORMAL LOW (ref 3.5–5.0)
Alkaline Phosphatase: 98 U/L (ref 38–126)
Anion gap: 8 (ref 5–15)
BUN: 20 mg/dL (ref 8–23)
CO2: 25 mmol/L (ref 22–32)
Calcium: 9.4 mg/dL (ref 8.9–10.3)
Chloride: 97 mmol/L — ABNORMAL LOW (ref 98–111)
Creatinine: 0.81 mg/dL (ref 0.61–1.24)
GFR, Estimated: >60 mL/min (ref >60)
Glucose, Bld: 112 mg/dL — ABNORMAL HIGH (ref 70–99)
Potassium: 4.2 mmol/L (ref 3.5–5.1)
Sodium: 130 mmol/L — ABNORMAL LOW (ref 135–145)
Total Bilirubin: 0.8 mg/dL (ref 0.3–1.2)
Total Protein: 6.2 g/dL — ABNORMAL LOW (ref 6.5–8.1)

## 2020-08-14 LAB — CBC WITH DIFFERENTIAL (CANCER CENTER ONLY)
Abs Immature Granulocytes: 0.02 10*3/uL (ref 0.00–0.07)
Basophils Absolute: 0 10*3/uL (ref 0.0–0.1)
Basophils Relative: 0 %
Eosinophils Absolute: 0 10*3/uL (ref 0.0–0.5)
Eosinophils Relative: 0 %
HCT: 33 % — ABNORMAL LOW (ref 39.0–52.0)
Hemoglobin: 11.6 g/dL — ABNORMAL LOW (ref 13.0–17.0)
Immature Granulocytes: 1 %
Lymphocytes Relative: 10 %
Lymphs Abs: 0.3 10*3/uL — ABNORMAL LOW (ref 0.7–4.0)
MCH: 31.1 pg (ref 26.0–34.0)
MCHC: 35.2 g/dL (ref 30.0–36.0)
MCV: 88.5 fL (ref 80.0–100.0)
Monocytes Absolute: 0.4 10*3/uL (ref 0.1–1.0)
Monocytes Relative: 14 %
Neutro Abs: 2.2 10*3/uL (ref 1.7–7.7)
Neutrophils Relative %: 75 %
Platelet Count: 144 10*3/uL — ABNORMAL LOW (ref 150–400)
RBC: 3.73 MIL/uL — ABNORMAL LOW (ref 4.22–5.81)
RDW: 14.1 % (ref 11.5–15.5)
WBC Count: 2.9 10*3/uL — ABNORMAL LOW (ref 4.0–10.5)
nRBC: 0 % (ref 0.0–0.2)

## 2020-08-14 MED ORDER — PROCHLORPERAZINE MALEATE 10 MG PO TABS: 10.0000 mg | ORAL_TABLET | Freq: Four times a day (QID) | ORAL | 0 refills | Status: DC | PRN

## 2020-08-14 MED ORDER — PALONOSETRON HCL INJECTION 0.25 MG/5ML
0.2500 mg | Freq: Once | INTRAVENOUS | Status: AC
  Administered 2020-08-14: 0.25 mg via INTRAVENOUS

## 2020-08-14 MED ORDER — SODIUM CHLORIDE 0.9 % IV SOLN
40.0000 mg/m2 | Freq: Once | INTRAVENOUS | Status: AC
  Administered 2020-08-14: 81 mg via INTRAVENOUS
  Filled 2020-08-14: qty 81

## 2020-08-14 MED ORDER — PALONOSETRON HCL INJECTION 0.25 MG/5ML
INTRAVENOUS | Status: AC
  Filled 2020-08-14: qty 5

## 2020-08-14 MED ORDER — SODIUM CHLORIDE 0.9% FLUSH
10.0000 mL | Freq: Once | INTRAVENOUS | Status: AC
  Administered 2020-08-14: 10 mL
  Filled 2020-08-14: qty 10

## 2020-08-14 MED ORDER — SODIUM CHLORIDE 0.9 % IV SOLN
10.0000 mg | Freq: Once | INTRAVENOUS | Status: AC
  Administered 2020-08-14: 10 mg via INTRAVENOUS
  Filled 2020-08-14: qty 10

## 2020-08-14 MED ORDER — HEPARIN SOD (PORK) LOCK FLUSH 100 UNIT/ML IV SOLN
500.0000 [IU] | Freq: Once | INTRAVENOUS | Status: AC | PRN
  Administered 2020-08-14: 500 [IU]
  Filled 2020-08-14: qty 5

## 2020-08-14 MED ORDER — SODIUM CHLORIDE 0.9 % IV SOLN
Freq: Once | INTRAVENOUS | Status: AC
  Filled 2020-08-14: qty 10

## 2020-08-14 MED ORDER — SODIUM CHLORIDE 0.9% FLUSH
10.0000 mL | INTRAVENOUS | Status: DC | PRN
  Administered 2020-08-14: 10 mL
  Filled 2020-08-14: qty 10

## 2020-08-14 MED ORDER — SODIUM CHLORIDE 0.9 % IV SOLN
150.0000 mg | Freq: Once | INTRAVENOUS | Status: AC
  Administered 2020-08-14: 150 mg via INTRAVENOUS
  Filled 2020-08-14: qty 150

## 2020-08-14 MED ORDER — SODIUM CHLORIDE 0.9 % IV SOLN
Freq: Once | INTRAVENOUS | Status: AC
  Filled 2020-08-14: qty 250

## 2020-08-14 MED FILL — PROCHLORPERAZINE 10 MG TAB: 10 | 7 days supply | Qty: 30 | Fill #0

## 2020-08-14 NOTE — Patient Instructions (Signed)
[facility] Discharge Instructions for Patients Receiving Chemotherapy  Today you received the following chemotherapy agents Cisplatin (PLATINOL).  To help prevent nausea and vomiting after your treatment, we encourage you to take your nausea medication as prescribed.   If you develop nausea and vomiting that is not controlled by your nausea medication, call the clinic.   BELOW ARE SYMPTOMS THAT SHOULD BE REPORTED IMMEDIATELY:  *FEVER GREATER THAN 100.5 F  *CHILLS WITH OR WITHOUT FEVER  NAUSEA AND VOMITING THAT IS NOT CONTROLLED WITH YOUR NAUSEA MEDICATION  *UNUSUAL SHORTNESS OF BREATH  *UNUSUAL BRUISING OR BLEEDING  TENDERNESS IN MOUTH AND THROAT WITH OR WITHOUT PRESENCE OF ULCERS  *URINARY PROBLEMS  *BOWEL PROBLEMS  UNUSUAL RASH Items with * indicate a potential emergency and should be followed up as soon as possible.  Feel free to call the clinic should you have any questions or concerns. The clinic phone number is [phone].  Please show the CHEMO ALERT CARD at check-in to the Emergency Department and triage nurse.   

## 2020-08-14 NOTE — Progress Notes (Signed)
Nutrition follow-up completed with patient during infusion for right tongue cancer. Patient received concurrent chemoradiation therapy. Weight continues to decrease and was documented as 155.5 pounds today decreased from 177.25 pounds September 10.  This is a 12% weight loss in less than 3 months which is significant. Patient noted to have moderate to severe fat loss and muscle loss. Patient meets criteria for severe malnutrition. Reports he is getting 48 ounces of water daily. Still only tolerating 3-1/2-4 cartons of [employer] 1.4 daily.  He denies nausea however he reports he has nausea if he tries to increase tube feeding to goal rate. Noted labs of glucose 112 and sodium 130.  Estimated nutrition needs: 2100-2300 cal, 92-107 g protein, 2.2 L fluid.  Nutrition diagnosis: Inadequate oral intake continues.  Intervention: Explained importance of patient increasing tube feeding to better meet calorie and protein needs.  Explained severe weight loss and continued inadequate nutrition would delay healing. Educated patient to take nausea medication if needed since he is experiencing nausea when doing tube feeding at goal.  It is important for him to increase tube feeding to a minimum of 5 cartons every day. Offered to order patient a continuous feeding pump to increase tube feeding however he adamantly refused. He denies other needs.  Monitoring, evaluation, goals: Patient will tolerate increased tube feeding to meet greater than 90% estimated minimum nutrition needs.  Next visit: Wednesday, December 1 during infusion.  **Disclaimer: This note was dictated with voice recognition software. Similar sounding words can inadvertently be transcribed and this note may contain transcription errors which may not have been corrected upon publication of note.**

## 2020-08-15 ENCOUNTER — Ambulatory Visit
Admission: RE | Admit: 2020-08-15 | Discharge: 2020-08-15 | Disposition: A | Discharge status: Home / Self Care | Payer: Medicare Other | Source: Ambulatory Visit | Attending: Radiation Oncology | Admitting: Radiation Oncology

## 2020-08-15 DIAGNOSIS — C01 Malignant neoplasm of base of tongue: Secondary | ICD-10-CM | POA: Diagnosis not present

## 2020-08-16 ENCOUNTER — Other Ambulatory Visit: Payer: Self-pay

## 2020-08-16 ENCOUNTER — Inpatient Hospital Stay: Payer: Medicare Other

## 2020-08-16 ENCOUNTER — Ambulatory Visit
Admission: RE | Admit: 2020-08-16 | Discharge: 2020-08-16 | Disposition: A | Discharge status: Home / Self Care | Payer: Medicare Other | Source: Ambulatory Visit | Attending: Radiation Oncology | Admitting: Radiation Oncology

## 2020-08-16 VITALS — BP 120/71 | HR 66 | Temp 98.1°F | Resp 18

## 2020-08-16 DIAGNOSIS — Z95828 Presence of other vascular implants and grafts: Secondary | ICD-10-CM

## 2020-08-16 DIAGNOSIS — C01 Malignant neoplasm of base of tongue: Secondary | ICD-10-CM | POA: Diagnosis not present

## 2020-08-16 DIAGNOSIS — Z5111 Encounter for antineoplastic chemotherapy: Secondary | ICD-10-CM | POA: Diagnosis not present

## 2020-08-16 MED ORDER — SODIUM CHLORIDE 0.9 % IV SOLN
INTRAVENOUS | Status: AC
  Filled 2020-08-16: qty 250

## 2020-08-16 MED ORDER — HEPARIN SOD (PORK) LOCK FLUSH 100 UNIT/ML IV SOLN
500.0000 [IU] | Freq: Once | INTRAVENOUS | Status: AC
  Administered 2020-08-16: 500 [IU]
  Filled 2020-08-16: qty 5

## 2020-08-16 MED ORDER — SODIUM CHLORIDE 0.9% FLUSH
10.0000 mL | Freq: Once | INTRAVENOUS | Status: AC
  Administered 2020-08-16: 10 mL
  Filled 2020-08-16: qty 10

## 2020-08-16 NOTE — Patient Instructions (Signed)

## 2020-08-17 ENCOUNTER — Ambulatory Visit
Admission: RE | Admit: 2020-08-17 | Discharge: 2020-08-17 | Disposition: A | Discharge status: Home / Self Care | Payer: Medicare Other | Source: Ambulatory Visit | Attending: Radiation Oncology | Admitting: Radiation Oncology

## 2020-08-17 ENCOUNTER — Other Ambulatory Visit: Payer: Self-pay

## 2020-08-17 ENCOUNTER — Inpatient Hospital Stay: Payer: Medicare Other

## 2020-08-17 VITALS — BP 116/80 | HR 70 | Temp 98.4°F | Resp 18

## 2020-08-17 DIAGNOSIS — C01 Malignant neoplasm of base of tongue: Secondary | ICD-10-CM | POA: Diagnosis not present

## 2020-08-17 DIAGNOSIS — Z5111 Encounter for antineoplastic chemotherapy: Secondary | ICD-10-CM | POA: Diagnosis not present

## 2020-08-17 DIAGNOSIS — Z95828 Presence of other vascular implants and grafts: Secondary | ICD-10-CM

## 2020-08-17 MED ORDER — SODIUM CHLORIDE 0.9 % IV SOLN
INTRAVENOUS | Status: AC
  Filled 2020-08-17 (×2): qty 250

## 2020-08-17 MED ORDER — HEPARIN SOD (PORK) LOCK FLUSH 100 UNIT/ML IV SOLN
500.0000 [IU] | Freq: Once | INTRAVENOUS | Status: AC
  Administered 2020-08-17: 500 [IU]
  Filled 2020-08-17: qty 5

## 2020-08-17 MED ORDER — SODIUM CHLORIDE 0.9% FLUSH
10.0000 mL | Freq: Once | INTRAVENOUS | Status: AC
  Administered 2020-08-17: 10 mL
  Filled 2020-08-17: qty 10

## 2020-08-17 NOTE — Patient Instructions (Signed)

## 2020-08-18 ENCOUNTER — Encounter: Payer: Self-pay | Admitting: Radiation Oncology

## 2020-08-18 ENCOUNTER — Ambulatory Visit
Admission: RE | Admit: 2020-08-18 | Discharge: 2020-08-18 | Disposition: A | Discharge status: Home / Self Care | Payer: Medicare Other | Source: Ambulatory Visit | Attending: Radiation Oncology | Admitting: Radiation Oncology

## 2020-08-18 DIAGNOSIS — C01 Malignant neoplasm of base of tongue: Secondary | ICD-10-CM | POA: Diagnosis not present

## 2020-08-18 NOTE — Progress Notes (Signed)
Oncology Nurse Navigator Documentation  Met with Patrick Nelson after final RT to offer support and to celebrate end of radiation treatment.   Provided verbal/written post-RT guidance:  Importance of keeping all follow-up appts, especially those with Nutrition and SLP.  Importance of protecting treatment area from sun.  Continuation of Sonafine application 2-3 times daily, application of antibiotic ointment to areas of raw skin; when supply of Sonafine exhausted transition to OTC lotion with vitamin E. Provided/reviewed Epic calendar of upcoming appts. Explained my role as navigator will continue for several more months, encouraged him to call me with needs/concerns.    Harlow Asa RN, BSN, OCN Head & Neck Oncology Nurse [place] at [facility] Phone # [phone]  Fax # [phone]

## 2020-08-21 ENCOUNTER — Inpatient Hospital Stay: Payer: Medicare Other

## 2020-08-21 ENCOUNTER — Other Ambulatory Visit: Payer: Self-pay

## 2020-08-21 VITALS — BP 122/72 | HR 72 | Temp 98.1°F | Resp 18

## 2020-08-21 DIAGNOSIS — Z5111 Encounter for antineoplastic chemotherapy: Secondary | ICD-10-CM | POA: Diagnosis not present

## 2020-08-21 DIAGNOSIS — Z95828 Presence of other vascular implants and grafts: Secondary | ICD-10-CM

## 2020-08-21 MED ORDER — SODIUM CHLORIDE 0.9 % IV SOLN
INTRAVENOUS | Status: AC
  Filled 2020-08-21: qty 250

## 2020-08-21 NOTE — Patient Instructions (Signed)

## 2020-08-23 ENCOUNTER — Inpatient Hospital Stay: Payer: Medicare Other

## 2020-08-23 ENCOUNTER — Other Ambulatory Visit: Payer: Self-pay

## 2020-08-23 VITALS — BP 116/78 | HR 72 | Temp 97.7°F | Resp 16

## 2020-08-23 DIAGNOSIS — Z5111 Encounter for antineoplastic chemotherapy: Secondary | ICD-10-CM | POA: Diagnosis not present

## 2020-08-23 DIAGNOSIS — Z95828 Presence of other vascular implants and grafts: Secondary | ICD-10-CM

## 2020-08-23 MED ORDER — SODIUM CHLORIDE 0.9% FLUSH
10.0000 mL | Freq: Once | INTRAVENOUS | Status: AC
  Administered 2020-08-23: 10 mL
  Filled 2020-08-23: qty 10

## 2020-08-23 MED ORDER — SODIUM CHLORIDE 0.9 % IV SOLN
INTRAVENOUS | Status: AC
  Filled 2020-08-23 (×2): qty 250

## 2020-08-23 MED ORDER — HEPARIN SOD (PORK) LOCK FLUSH 100 UNIT/ML IV SOLN
500.0000 [IU] | Freq: Once | INTRAVENOUS | Status: AC
  Administered 2020-08-23: 500 [IU]
  Filled 2020-08-23: qty 5

## 2020-08-23 NOTE — Progress Notes (Signed)
Patient stable at time of discharge. 

## 2020-08-23 NOTE — Patient Instructions (Signed)

## 2020-08-25 ENCOUNTER — Inpatient Hospital Stay: Payer: Medicare Other

## 2020-08-25 ENCOUNTER — Other Ambulatory Visit: Payer: Self-pay

## 2020-08-25 VITALS — BP 116/85 | HR 79 | Temp 98.2°F | Resp 16

## 2020-08-25 DIAGNOSIS — Z95828 Presence of other vascular implants and grafts: Secondary | ICD-10-CM

## 2020-08-25 DIAGNOSIS — Z5111 Encounter for antineoplastic chemotherapy: Secondary | ICD-10-CM | POA: Diagnosis not present

## 2020-08-25 MED ORDER — HEPARIN SOD (PORK) LOCK FLUSH 100 UNIT/ML IV SOLN
500.0000 [IU] | Freq: Once | INTRAVENOUS | Status: AC
  Administered 2020-08-25: 500 [IU]
  Filled 2020-08-25: qty 5

## 2020-08-25 MED ORDER — SODIUM CHLORIDE 0.9 % IV SOLN
INTRAVENOUS | Status: AC
  Filled 2020-08-25 (×2): qty 250

## 2020-08-25 MED ORDER — SODIUM CHLORIDE 0.9% FLUSH
10.0000 mL | Freq: Once | INTRAVENOUS | Status: AC
  Administered 2020-08-25: 10 mL
  Filled 2020-08-25: qty 10

## 2020-08-25 NOTE — Progress Notes (Signed)
Patient stable at time of discharge. 

## 2020-08-25 NOTE — Patient Instructions (Signed)

## 2020-08-28 ENCOUNTER — Other Ambulatory Visit: Payer: Self-pay

## 2020-08-28 ENCOUNTER — Inpatient Hospital Stay: Payer: Medicare Other

## 2020-08-28 VITALS — BP 129/79 | HR 66 | Temp 97.9°F | Resp 17 | Wt 155.0 lb

## 2020-08-28 DIAGNOSIS — Z95828 Presence of other vascular implants and grafts: Secondary | ICD-10-CM

## 2020-08-28 DIAGNOSIS — Z5111 Encounter for antineoplastic chemotherapy: Secondary | ICD-10-CM | POA: Diagnosis not present

## 2020-08-28 MED ORDER — SODIUM CHLORIDE 0.9% FLUSH
10.0000 mL | Freq: Once | INTRAVENOUS | Status: AC
  Administered 2020-08-28: 10 mL
  Filled 2020-08-28: qty 10

## 2020-08-28 MED ORDER — SODIUM CHLORIDE 0.9 % IV SOLN
INTRAVENOUS | Status: AC
  Filled 2020-08-28 (×2): qty 250

## 2020-08-28 MED ORDER — HEPARIN SOD (PORK) LOCK FLUSH 100 UNIT/ML IV SOLN
500.0000 [IU] | Freq: Once | INTRAVENOUS | Status: AC
  Administered 2020-08-28: 500 [IU]
  Filled 2020-08-28: qty 5

## 2020-08-28 NOTE — Patient Instructions (Signed)

## 2020-08-30 ENCOUNTER — Inpatient Hospital Stay: Payer: Medicare Other | Admitting: Nutrition

## 2020-08-30 ENCOUNTER — Other Ambulatory Visit: Payer: Self-pay

## 2020-08-30 ENCOUNTER — Inpatient Hospital Stay: Payer: Medicare Other | Attending: Hematology and Oncology

## 2020-08-30 VITALS — BP 105/65 | HR 85 | Temp 98.2°F | Resp 17 | Wt 153.5 lb

## 2020-08-30 DIAGNOSIS — Z79899 Other long term (current) drug therapy: Secondary | ICD-10-CM | POA: Diagnosis not present

## 2020-08-30 DIAGNOSIS — Z923 Personal history of irradiation: Secondary | ICD-10-CM | POA: Insufficient documentation

## 2020-08-30 DIAGNOSIS — Z8249 Family history of ischemic heart disease and other diseases of the circulatory system: Secondary | ICD-10-CM | POA: Insufficient documentation

## 2020-08-30 DIAGNOSIS — I480 Paroxysmal atrial fibrillation: Secondary | ICD-10-CM | POA: Insufficient documentation

## 2020-08-30 DIAGNOSIS — E785 Hyperlipidemia, unspecified: Secondary | ICD-10-CM | POA: Insufficient documentation

## 2020-08-30 DIAGNOSIS — C01 Malignant neoplasm of base of tongue: Secondary | ICD-10-CM | POA: Insufficient documentation

## 2020-08-30 DIAGNOSIS — Z95828 Presence of other vascular implants and grafts: Secondary | ICD-10-CM

## 2020-08-30 DIAGNOSIS — M25559 Pain in unspecified hip: Secondary | ICD-10-CM | POA: Diagnosis not present

## 2020-08-30 DIAGNOSIS — Z7982 Long term (current) use of aspirin: Secondary | ICD-10-CM | POA: Diagnosis not present

## 2020-08-30 MED ORDER — SODIUM CHLORIDE 0.9% FLUSH
10.0000 mL | Freq: Once | INTRAVENOUS | Status: AC
  Administered 2020-08-30: 10 mL
  Filled 2020-08-30: qty 10

## 2020-08-30 MED ORDER — HEPARIN SOD (PORK) LOCK FLUSH 100 UNIT/ML IV SOLN
500.0000 [IU] | Freq: Once | INTRAVENOUS | Status: AC
  Administered 2020-08-30: 500 [IU]
  Filled 2020-08-30: qty 5

## 2020-08-30 MED ORDER — SODIUM CHLORIDE 0.9 % IV SOLN
INTRAVENOUS | Status: AC
  Filled 2020-08-30 (×2): qty 250

## 2020-08-30 NOTE — Progress Notes (Signed)
Nutrition follow-up completed with patient during infusion for right tongue cancer. Weight was documented as 155 pounds on November 29 stable from 155.5 pounds November 15. Patient continue to meet criteria for severe malnutrition. He reports he is tolerating 4 cartons of [employer] 1.4 daily.  Sometimes he can tolerate 4-1/2 cartons.  He verbalizes goal rate of 5 cartons [employer] daily. He continues to consume additional water via PEG.  He flushes feeding tube with 180 mL free water 4 times daily.  Estimated nutrition needs: 2100-2300 cal, 92-107 g protein, 2.2 L fluid.  Nutrition diagnosis: Inadequate oral intake continues.  Intervention: Provided support for patient to continue increasing [employer] 1.4 to goal rate of 5 cartons daily to provide 2275 cal, 100 g protein, 2370 mL free water meeting 100% estimated needs. Patient can add an additional 8 to 10 ounces of water daily to reduce the need for additional IV fluids if tolerated. Educated patient on strategies for increasing oral intake as healing occurs. Questions were answered.  Teach back method used.  Monitoring, evaluation, goals: Patient will tolerate increased tube feeding and free water to meet estimated nutrition needs to minimize weight loss and promote healing.  Next visit: To be scheduled.  **Disclaimer: This note was dictated with voice recognition software. Similar sounding words can inadvertently be transcribed and this note may contain transcription errors which may not have been corrected upon publication of note.**

## 2020-08-30 NOTE — Patient Instructions (Signed)

## 2020-08-31 ENCOUNTER — Encounter: Payer: Self-pay | Admitting: Physical Therapy

## 2020-08-31 ENCOUNTER — Ambulatory Visit: Payer: Medicare Other | Attending: Radiation Oncology | Admitting: Physical Therapy

## 2020-08-31 DIAGNOSIS — R1310 Dysphagia, unspecified: Secondary | ICD-10-CM | POA: Diagnosis present

## 2020-08-31 DIAGNOSIS — C01 Malignant neoplasm of base of tongue: Secondary | ICD-10-CM | POA: Diagnosis present

## 2020-08-31 DIAGNOSIS — R293 Abnormal posture: Secondary | ICD-10-CM | POA: Insufficient documentation

## 2020-08-31 NOTE — Patient Instructions (Signed)
Access Code: [email or web address] URL: [email or web address] Date: 08/31/2020 Prepared by: Manus Gunning  Exercises Supine Bridge - 1 x daily - 7 x weekly - 1 sets - 10 reps Supine Posterior Pelvic Tilt - 1 x daily - 7 x weekly - 1 sets - 10 reps Supine Chin Tuck - 1 x daily - 7 x weekly - 1 sets - 10 reps - 5 sec hold Supine Isometric Neck Extension - 1 x daily - 7 x weekly - 1 sets - 10 reps - 5 sec hold Seated Single Arm Shoulder Row with Anchored Resistance - 1 x daily - 7 x weekly - 1 sets - 10 reps

## 2020-08-31 NOTE — Therapy (Signed)
[place], [place], [address] Phone: [phone]   Fax:  [phone]  Physical Therapy Treatment  Patient Details  Name: Patrick Nelson MRN: [id number] Date of Birth: 07-Dec-1950 Referring Provider (PT): Reita May Date: 08/31/2020   PT End of Session - 08/31/20 7062    Visit Number 2    Number of Visits 8    Date for PT Re-Evaluation 10/12/20    PT Start Time 3762    PT Stop Time 1448    PT Time Calculation (min) 51 min    Activity Tolerance Patient tolerated treatment well    Behavior During Therapy [facility] for tasks assessed/performed           Past Medical History:  Diagnosis Date  . Hyperlipidemia   . Paroxysmal atrial fibrillation [facility])     Past Surgical History:  Procedure Laterality Date  . DIRECT LARYNGOSCOPY N/A 05/22/2020   Procedure: DIRECT LARYNGOSCOPY w/BIOPSY TONGUE BASE;  Surgeon: Izora Gala, MD;  Location: [place];  Service: ENT;  Laterality: N/A;  . IR GASTROSTOMY TUBE MOD SED  06/26/2020  . IR IMAGING GUIDED PORT INSERTION  06/26/2020  . TOTAL HIP ARTHROPLASTY  2012   right hip-Dr. Berenice Primas    There were no vitals filed for this visit.   Subjective Assessment - 08/31/20 1359    Subjective I got through it. I just have a sore throat and mucous from everything.    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 Yes    Pain Score 1     Pain Location Throat    Pain Descriptors / Indicators Sore    Pain Type Acute pain    Pain Onset More than a month ago    Pain Frequency Intermittent    Aggravating Factors  mucous    Pain Relieving Factors swallowing    Effect of Pain on  Daily Activities none              [facility] PT Assessment - 08/31/20 0001      Sit to Stand   Comments 30 sec sit to stand: 12 rep- 12 is avg for his age      Posture/Postural Control   Posture/Postural Control Postural limitations    Postural Limitations Rounded Shoulders;Forward head    Posture Comments forward head has worsened since beginning of treatment      AROM   Cervical Flexion WFL    Cervical Extension 25% limited    Cervical - Right Side Bend 25% limited    Cervical - Left Side Bend 25% limited    Cervical - Right Rotation 25% limited    Cervical - Left Rotation 25% limited             LYMPHEDEMA/ONCOLOGY QUESTIONNAIRE - 08/31/20 0001      Head and Neck   4 cm superior to sternal notch around neck 35.6 cm    6 cm superior to sternal notch around neck 35 cm    8 cm superior to sternal notch around neck 35.5 cm                      [facility] Adult PT Treatment/Exercise - 08/31/20 0001  Neck Exercises: Supine   Cervical Isometrics Extension;5 secs;10 reps   pt required v/c to do correctly   Neck Retraction 10 reps;5 secs   with v/c for correct form     Lumbar Exercises: Supine   Pelvic Tilt 10 reps;5 seconds   with v/c, t/c and pt return demonstrated    Pelvic Tilt Limitations pt required therapist demo to perform correctly, required verbal and tactile cues for correct activation of core muscles    Bridge 10 reps;3 seconds   v/c to engage abdominals throughout     Shoulder Exercises: Seated   Row Strengthening;Both;10 reps;Theraband    Theraband Level (Shoulder Row) Level 2 (Red)                  PT Education - 08/31/20 1458    Education Details benefits of a whole mattress wedge for positioning, being aware of posture throughout the day and correcting    Person(s) Educated Patient    Methods Explanation    Comprehension Verbalized understanding               PT Long Term Goals - 08/31/20 1451      PT LONG TERM GOAL #1    Title Pt will return to baseline ROM measurements and will not demonstrate any signs of lymphedema to allow pt to return to PLOF.    Time 8    Period Weeks    Status On-going      PT LONG TERM GOAL #2   Title Pt will be able to sit upright with good posture (alignment of head over shoulders) without v/c to self correct.    Baseline pt sits with very forward head    Time 6    Period Weeks    Status New    Target Date 10/12/20      PT LONG TERM GOAL #3   Title Pt will be indepdent in a home exercise program for continued strengthening and stretching.    Time 6    Period Weeks    Status New    Target Date 10/12/20                 Plan - 08/31/20 1453    Clinical Impression Statement Pt returns to PT for follow up visit after undergoing treatment for head and neck cancer. He has now completed radiation. Overall he feels he is doing well. He lost over 20 lbs. His cervical ROM is slightly more limited but his posture has changed greatly. Pt now has very forward head when sitting and would benefit from skilled PT services to improve forward head and rounded shoulders. He has tightness across anterior neck causing more limitations with cervical extension.    PT Frequency 1x / week    PT Duration 6 weeks    PT Treatment/Interventions ADLs/Self Care Home Management;Therapeutic exercise;Patient/family education;Manual techniques;Passive range of motion    PT Next Visit Plan see how HEP is going, add exercises to HEP as needed, needs posture ex esp neck retraction and extension and scapular retraction    PT Home Exercise Plan head and neck ROM exercises;[email or web address]    Consulted and Agree with Plan of Care Patient           Patient will benefit from skilled therapeutic intervention in order to improve the following deficits and impairments:  Postural dysfunction, Decreased range of motion  Visit Diagnosis: Abnormal posture  Malignant neoplasm of base of tongue (HCC)     Problem  List Patient Active Problem List   Diagnosis Date Noted  . Port-A-Cath in place 07/17/2020  . Squamous cell carcinoma of base of tongue ([place]) 06/12/2020  . Changing skin lesion 01/07/2019  . Sebaceous cyst 01/07/2019  . Syncope 12/14/2014  . CAP (community acquired pneumonia) 12/14/2014  . Right lower lobe pneumonia 12/14/2014  . Parapneumonic effusion 12/14/2014  . Paroxysmal atrial fibrillation ([place]) 01/25/2014  . Hyperlipidemia 01/25/2014    [name] [facility] 08/31/2020, 2:59 PM  [place], [place], [address] Phone: [phone]   Fax:  [phone]  Name: Patrick Nelson MRN: [id number] Date of Birth: 28-Jun-1951  Manus Gunning, PT 08/31/20 2:59 PM

## 2020-09-01 ENCOUNTER — Inpatient Hospital Stay (HOSPITAL_BASED_OUTPATIENT_CLINIC_OR_DEPARTMENT_OTHER): Payer: Medicare Other | Admitting: Hematology and Oncology

## 2020-09-01 ENCOUNTER — Encounter: Payer: Self-pay | Admitting: Hematology and Oncology

## 2020-09-01 ENCOUNTER — Other Ambulatory Visit: Payer: Self-pay | Admitting: Hematology and Oncology

## 2020-09-01 ENCOUNTER — Inpatient Hospital Stay: Payer: Medicare Other

## 2020-09-01 ENCOUNTER — Other Ambulatory Visit: Payer: Self-pay

## 2020-09-01 VITALS — BP 109/70 | HR 80 | Temp 98.9°F | Resp 18 | Wt 152.5 lb

## 2020-09-01 DIAGNOSIS — Z95828 Presence of other vascular implants and grafts: Secondary | ICD-10-CM

## 2020-09-01 DIAGNOSIS — C01 Malignant neoplasm of base of tongue: Secondary | ICD-10-CM

## 2020-09-01 LAB — CBC WITH DIFFERENTIAL (CANCER CENTER ONLY)
Abs Immature Granulocytes: 0.03 10*3/uL (ref 0.00–0.07)
Basophils Absolute: 0 10*3/uL (ref 0.0–0.1)
Basophils Relative: 1 %
Eosinophils Absolute: 0 10*3/uL (ref 0.0–0.5)
Eosinophils Relative: 0 %
HCT: 26.8 % — ABNORMAL LOW (ref 39.0–52.0)
Hemoglobin: 9.3 g/dL — ABNORMAL LOW (ref 13.0–17.0)
Immature Granulocytes: 1 %
Lymphocytes Relative: 23 %
Lymphs Abs: 0.7 10*3/uL (ref 0.7–4.0)
MCH: 32.5 pg (ref 26.0–34.0)
MCHC: 34.7 g/dL (ref 30.0–36.0)
MCV: 93.7 fL (ref 80.0–100.0)
Monocytes Absolute: 0.6 10*3/uL (ref 0.1–1.0)
Monocytes Relative: 20 %
Neutro Abs: 1.7 10*3/uL (ref 1.7–7.7)
Neutrophils Relative %: 55 %
Platelet Count: 174 10*3/uL (ref 150–400)
RBC: 2.86 MIL/uL — ABNORMAL LOW (ref 4.22–5.81)
RDW: 18.1 % — ABNORMAL HIGH (ref 11.5–15.5)
WBC Count: 3.1 10*3/uL — ABNORMAL LOW (ref 4.0–10.5)
nRBC: 0 % (ref 0.0–0.2)

## 2020-09-01 LAB — CMP (CANCER CENTER ONLY)
ALT: 8 U/L (ref 0–44)
AST: 16 U/L (ref 15–41)
Albumin: 2.7 g/dL — ABNORMAL LOW (ref 3.5–5.0)
Alkaline Phosphatase: 79 U/L (ref 38–126)
Anion gap: 8 (ref 5–15)
BUN: 17 mg/dL (ref 8–23)
CO2: 24 mmol/L (ref 22–32)
Calcium: 8.6 mg/dL — ABNORMAL LOW (ref 8.9–10.3)
Chloride: 100 mmol/L (ref 98–111)
Creatinine: 0.62 mg/dL (ref 0.61–1.24)
GFR, Estimated: >60 mL/min (ref >60)
Glucose, Bld: 89 mg/dL (ref 70–99)
Potassium: 4.2 mmol/L (ref 3.5–5.1)
Sodium: 132 mmol/L — ABNORMAL LOW (ref 135–145)
Total Bilirubin: 0.3 mg/dL (ref 0.3–1.2)
Total Protein: 5.2 g/dL — ABNORMAL LOW (ref 6.5–8.1)

## 2020-09-01 LAB — MAGNESIUM: Magnesium: 1.7 mg/dL (ref 1.7–2.4)

## 2020-09-01 MED ORDER — HEPARIN SOD (PORK) LOCK FLUSH 100 UNIT/ML IV SOLN
500.0000 [IU] | Freq: Once | INTRAVENOUS | Status: AC
  Administered 2020-09-01: 500 [IU]
  Filled 2020-09-01: qty 5

## 2020-09-01 MED ORDER — SODIUM CHLORIDE 0.9% FLUSH
10.0000 mL | Freq: Once | INTRAVENOUS | Status: AC
  Administered 2020-09-01: 10 mL
  Filled 2020-09-01: qty 10

## 2020-09-01 MED ORDER — SODIUM CHLORIDE 0.9 % IV SOLN
INTRAVENOUS | Status: AC
  Filled 2020-09-01: qty 250

## 2020-09-01 NOTE — Progress Notes (Signed)
[place] Telephone:([phone]) [phone]   Fax:(336) [phone]  PROGRESS NOTE  Patient Care Team: Wenda Low, MD as PCP - General (Internal Medicine) Mal[place]elt, Stephani Police, RN as Oncology Nurse Navigator Eppie Gibson, MD as Consulting Physician (Radiation Oncology) Orson Slick, MD as Consulting Physician (Hematology and Oncology) Sharen Counter, CCC-SLP as Speech Language Pathologist (Speech Pathology) Wynelle Beckmann, Melodie Bouillon, PT as Physical Therapist (Physical Therapy) Beverely Pace, LCSW as Social Worker (General Practice) Karie Mainland, RD as Dietitian (Nutrition) Izora Gala, MD as Consulting Physician (Otolaryngology)  Hematological/Oncological History 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 Mass biopsied at the right base of the tongue. Biopsy showed invasive moderately differentiated squamous cell carcinoma 3) 06/06/2020: PET CT scan showed hypermetabolic right tongue base mass consistent with known neoplasm. Right level 2 and level 3 metastatic adenopathy. No evidence of metastatic disease.  4) 06/09/2020: established care with Dr. Isidore Moos 5) 06/12/2020: establish care with Dr. Lorenso Courier  6) 07/03/2020: Week 1 of Cisplatin chemoradiation 7) 07/10/2020: Week 2 of Cisplatin chemoradiation 8) 07/17/2020: Week 3 of Cisplatin chemoradiation 9) 07/24/2020: Week 4 of Cisplatin chemoradiation 10) 07/31/2020: Week 5 of Cisplatin chemoradiation 11) 08/07/2020: Week 6 of Cisplatin chemoradiation 12) 08/14/2020: Week 7 of Cisplatin chemoradiation  Interval History:  Patrick Nelson 69 y.o. male with medical history significant for SCC of the base of the tongue who presents for a follow up visit. The patient's last visit  was on 08/14/2020. In the interim since the last visit he has completed chemoradiation.  On exam today Patrick Nelson notes he feels improved from prior. He reports that he is taking 90% of his nutrition by G-tube, and is taking in some soft foods by mouth. He has not been taking his pain medication as he does not feel that he needs it.   He reports he is having normal bowel movements.  The rash around his neck has improved markedly as he has been using the cream to good effect.  He otherwise denies having issues with fevers, chills, sweats, chest pain, bleeding, or other symptoms.  MEDICAL HISTORY:  Past Medical History:  Diagnosis Date  . Hyperlipidemia   . Paroxysmal atrial fibrillation ([place])     SURGICAL HISTORY: Past Surgical History:  Procedure Laterality Date  . DIRECT LARYNGOSCOPY N/A 05/22/2020   Procedure: DIRECT LARYNGOSCOPY w/BIOPSY TONGUE BASE;  Surgeon: Izora Gala, MD;  Location: [place] [facility];  Service: ENT;  Laterality: N/A;  . IR GASTROSTOMY TUBE MOD SED  06/26/2020  . IR IMAGING GUIDED PORT INSERTION  06/26/2020  . TOTAL HIP ARTHROPLASTY  2012   right hip-Dr. Berenice Primas    SOCIAL HISTORY: Social History   Socioeconomic History  . Marital status: Significant Other    Spouse name: Not on file  . Number of children: 1  . Years of education: Not on file  . Highest education level: Not on file  Occupational History  . Not on file  Tobacco Use  . Smoking status: Never Smoker  . Smokeless tobacco: Never Used  Vaping Use  . Vaping Use: Never used  Substance and Sexual Activity  . Alcohol use: Yes  . Drug use: No  . Sexual activity: Yes  Other Topics Concern  . Not on file  Social History Narrative  . Not on file   Social Determinants of Health   Financial Resource Strain: Low Risk   . Difficulty of Paying Living Expenses: Not hard at all  Food Insecurity: No Food Insecurity  . Worried About Charity fundraiser in the Last Year: Never true  . Ran Out  of Food in the Last Year: Never true  Transportation Needs: No Transportation Needs  . Lack of Transportation (Medical): No  . Lack of Transportation (Non-Medical): No  Physical Activity:   . Days of Exercise per Week: Not on file  . Minutes of Exercise per Session: Not on file  Stress:   . Feeling of Stress : Not on file  Social Connections: Moderately Isolated  . Frequency of Communication with Friends and Family: More than three times a week  . Frequency of Social Gatherings with Friends and Family: More than three times a week  . Attends Religious Services: Never  . Active Member of Clubs or Organizations: No  . Attends Archivist Meetings: Not on file  . Marital Status: Married  Human resources officer Violence:   . Fear of Current or Ex-Partner: Not on file  . Emotionally Abused: Not on file  . Physically Abused: Not on file  . Sexually Abused: Not on file    FAMILY HISTORY: Family History  Problem Relation Age of Onset  . Hypertension Mother   . Heart disease Mother   . Heart disease Brother     ALLERGIES:  has No Known Allergies.  MEDICATIONS:  Current Outpatient Medications  Medication Sig Dispense Refill  . acetaminophen (TYLENOL) 325 MG tablet Take 650 mg by mouth every 6 (six) hours as needed.    Marland Kitchen amoxicillin (AMOXIL) 500 MG capsule Take four capsules one hour before dental appointment. 4 capsule 3  . aspirin 81 MG chewable tablet Chew by mouth daily. (Patient not taking: Reported on 09/01/2020)    . atorvastatin (LIPITOR) 10 MG tablet Take 10 mg by mouth daily.    . Cholecalciferol (VITAMIN D3 SUPER STRENGTH) 50 MCG (2000 UT) TABS Take 2,000 Units by mouth.    . cyanocobalamin 1000 MCG tablet Take 1,000 mcg by mouth daily.    . folic acid (FOLVITE) 202 MCG tablet Take 400 mcg by mouth daily.    Marland Kitchen lidocaine (XYLOCAINE) 2 % solution Patient: Mix 1part 2% viscous lidocaine, 1part H20. Swish & swallow 63mL of diluted mixture, 47min before meals and at bedtime,  up to QID 200 mL 5  . lidocaine-prilocaine (EMLA) cream Apply 1 application topically as needed. 30 g 0  . LORazepam (ATIVAN) 0.5 MG tablet Take 1 tab 30 min before radiotherapy, prn anxiety.  Will need a driver if taking this medication. 20 tablet 0  . melatonin 5 MG TABS Take 5 mg by mouth at bedtime.    . Misc Natural Products (CHROMIUM PICOLINATE FORTIFIED PO) Take by mouth.    . Nutritional Supplements (ANTI-INFLAMMATORY ENZYME) CAPS Take by mouth. Recover AI    . Nutritional Supplements (KATE FARMS STANDARD 1.4) LIQD 1 Container by Enteral route 5 (five) times daily. 1625 mL 8  . ondansetron (ZOFRAN) 8 MG tablet Take 1 tablet (8 mg total) by mouth every 8 (eight) hours as needed for nausea or vomiting. 20 tablet 0  . oxyCODONE (ROXICODONE) 5 MG/5ML solution Take 5 mLs (5 mg total) by mouth every 6 (six) hours as needed for severe pain. 200 mL 0  .  prochlorperazine (COMPAZINE) 10 MG tablet Take 1 tablet (10 mg total) by mouth every 6 (six) hours as needed for nausea or vomiting. 30 tablet 0  . Sennosides (SENNA) 8.8 MG/5ML LIQD Take 10 mLs by mouth 2 (two) times daily as needed. 105 mL 1  . sodium fluoride (PREVIDENT 5000 PLUS) 1.1 % CREA dental cream Apply to tooth brush. Brush teeth for 2 minutes. Spit out excess-DO NOT swallow. Repeat nightly. 102 g prn   No current facility-administered medications for this visit.   Facility-Administered Medications Ordered in Other Visits  Medication Dose Route Frequency Provider Last Rate Last Admin  . 0.9 %  sodium chloride infusion   Intravenous Continuous Ledell Peoples IV, MD 500 mL/hr at 09/01/20 0813 New Bag at 09/01/20 0813  . heparin lock flush 100 unit/mL  500 Units Intracatheter Once Narda Rutherford T IV, MD      . sodium chloride flush (NS) 0.9 % injection 10 mL  10 mL Intracatheter Once Orson Slick, MD        REVIEW OF SYSTEMS:   Constitutional: ( - ) fevers, ( - )  chills , ( - ) night sweats Eyes: ( - ) blurriness of vision, ( - )  double vision, ( - ) watery eyes Ears, nose, mouth, throat, and face: ( - ) mucositis, ( - ) sore throat Respiratory: ( - ) cough, ( - ) dyspnea, ( - ) wheezes Cardiovascular: ( - ) palpitation, ( - ) chest discomfort, ( - ) lower extremity swelling Gastrointestinal:  ( - ) nausea, ( - ) heartburn, ( - ) change in bowel habits Skin: ( - ) abnormal skin rashes Lymphatics: ( - ) new lymphadenopathy, ( - ) easy bruising Neurological: ( - ) numbness, ( - ) tingling, ( - ) new weaknesses Behavioral/Psych: ( - ) mood change, ( - ) new changes  All other systems were reviewed with the patient and are negative.  PHYSICAL EXAMINATION: ECOG PERFORMANCE STATUS: 0 - Asymptomatic  There were no vitals filed for this visit. There were no vitals filed for this visit.  GENERAL: well appearing elderly Caucasian male alert, no distress and comfortable SKIN:  Worsening erythema/skin drying around neck.  EYES: conjunctiva are pink and non-injected, sclera clear OROPHARYNX: thick ropey saliva, but no erythema noted.  LUNGS: clear to auscultation and percussion with normal breathing effort HEART: regular rate & rhythm and no murmurs and no lower extremity edema Musculoskeletal: no cyanosis of digits and no clubbing  PSYCH: alert & oriented x 3, fluent speech NEURO: no focal motor/sensory deficits  LABORATORY DATA:  I have reviewed the data as listed CBC Latest Ref Rng & Units 08/14/2020 08/07/2020 07/31/2020  WBC 4.0 - 10.5 K/uL 2.9(L) 4.9 5.1  Hemoglobin 13.0 - 17.0 g/dL 11.6(L) 12.3(L) 12.2(L)  Hematocrit 39 - 52 % 33.0(L) 34.9(L) 35.4(L)  Platelets 150 - 400 K/uL 144(L) 149(L) 152    CMP Latest Ref Rng & Units 08/14/2020 08/07/2020 07/31/2020  Glucose 70 - 99 mg/dL 112(H) 118(H) 112(H)  BUN 8 - 23 mg/dL 20 23 18   Creatinine 0.61 - 1.24 mg/dL 0.81 0.83 0.78  Sodium 135 - 145 mmol/L 130(L) 130(L) 131(L)  Potassium 3.5 - 5.1 mmol/L 4.2 4.5 4.4  Chloride 98 - 111 mmol/L 97(L) 97(L) 99  CO2 22 - 32  mmol/L 25 25 25   Calcium 8.9 - 10.3 mg/dL 9.4 9.2 9.1  Total Protein 6.5 - 8.1 g/dL 6.2(L) 6.1(L) 5.7(L)  Total Bilirubin 0.3 -  1.2 mg/dL 0.8 0.5 0.5  Alkaline Phos 38 - 126 U/L 98 80 82  AST 15 - 41 U/L 14(L) 15 15  ALT 0 - 44 U/L 13 13 15     RADIOGRAPHIC STUDIES: No results found.  ASSESSMENT & PLAN Patrick Nelson 69 y.o. male with medical history significant for SCC of the base of the tongue who presents for a follow up visit.  Review the labs, review the records, discussion with the patient the findings are most consistent with a squamous cell carcinoma of the back base of the tongue.  This was a T1 N1 M0 p16 positive cancer which makes it stage I.  Overall the prognosis for this is quite well and there is an excellent chance of durable remission after chemoradiation therapy with cisplatin.   On exam today Patrick Nelson is taking in 90% of his nutrition via G-tube (with 10% soft foods) and has been declining to take his pain medication.  He is not having any fevers, chills, sweats.  The rash around his neck has improved markedly. Overall he appears improved from prior.   Previously we discussed treatment moving forward and the expected side effects and symptoms.  Side effects include but are not limited to fatigue, anemia, kidney dysfunction, nausea, vomiting, diarrhea, throat pain, weight loss, and neuropathy.  Patient his wife were agreeable to proceeding with treatment.  We also discussed the medications to help with potential side effects including Zofran as needed, Compazine as needed, and EMLA cream.    The treatment of choice for this patient will be radiation with concurrent cisplatin 40 mg per metered squared weekly x7 doses.  We started this on 07/03/2020 and had weekly visits with the patient while he is receiving this treatment. He completed chemotherapy on 08/14/2020.   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 P16+, Stage I  --patient completed 7 weekly  doses of cisplatin 40mg /m2 administered with radiation. Today he presents for a follow up visit for IV fluids.  --supportive care as noted below. --at each visit check CMP, CBC and periodic TSH  --plan to see patient in 2 weeks time.   #Pain Control --prescribed liquid roxicet 21ml q 6H PRN. No using.   #Supportive Care --patient provided with zofran 8mg  q8H PRN and compazine 10mg  q6H PRN.  --EMLA cream for port provided --PEG and Port in place. Appreciate assistance from dietary regarding tube feeds --pain control as above --erythematous radiation rash improving with topical treatments.   No orders of the defined types were placed in this encounter.  All questions were answered. The patient knows to call the clinic with any problems, questions or concerns.  A total of more than 30 minutes were spent on this encounter and over half of that time was spent on counseling and coordination of care as outlined above.   Ledell Peoples, MD Department of Hematology/Oncology [place] at [facility] Phone: [phone] Pager: [phone] Email: [email or web address]  09/01/2020 8:55 AM

## 2020-09-01 NOTE — Patient Instructions (Signed)

## 2020-09-04 ENCOUNTER — Telehealth: Payer: Self-pay | Admitting: *Deleted

## 2020-09-04 ENCOUNTER — Inpatient Hospital Stay: Payer: Medicare Other

## 2020-09-04 NOTE — Telephone Encounter (Signed)
Received call from patient inquiring about his activity restrictions.  He has completed his chemo and radiation and is recovering from those. In particular , he asked if he could play some golf-putting etc.  Advised that he should be mindful of his port and PEG and not do any heavy lifting. Simple putting should be fine. Pt voiced understanding. He also said he did not need the IVF appts scheduled for this week. These appts were cancelled.

## 2020-09-05 ENCOUNTER — Encounter: Payer: Self-pay | Admitting: Rehabilitation

## 2020-09-05 ENCOUNTER — Other Ambulatory Visit: Payer: Self-pay

## 2020-09-05 ENCOUNTER — Ambulatory Visit: Payer: Medicare Other | Admitting: Rehabilitation

## 2020-09-05 DIAGNOSIS — C01 Malignant neoplasm of base of tongue: Secondary | ICD-10-CM

## 2020-09-05 DIAGNOSIS — R293 Abnormal posture: Secondary | ICD-10-CM

## 2020-09-05 NOTE — Patient Instructions (Signed)
Access Code: [email or web address]: [email or web address]: 12/07/2021Prepared by: Marcene Brawn TevisExercises  Supine Bridge - 1 x daily - 7 x weekly - 1 sets - 10 reps  Supine Posterior Pelvic Tilt - 1 x daily - 7 x weekly - 1 sets - 10 reps  Supine Chin Tuck - 1 x daily - 7 x weekly - 1 sets - 10 reps - 5 sec hold  Supine Isometric Neck Extension - 1 x daily - 7 x weekly - 1 sets - 10 reps - 5 sec hold  Seated Single Arm Shoulder Row with Anchored Resistance - 1 x daily - 7 x weekly - 1 sets - 10 reps  Seated Cervical Retraction Protraction AROM - 1 x daily - 7 x weekly - 1 sets - 10 reps - 2-3 second hold  Standing Anatomical Position with Scapular Retraction and Depression at Wall - 1 x daily - 7 x weekly - 1 sets - 5 reps - any hold

## 2020-09-05 NOTE — Therapy (Signed)
[place], [place], [address] Phone: [phone]   Fax:  [phone]  Physical Therapy Treatment  Patient Details  Name: Patrick Nelson MRN: [id number] Date of Birth: [birth year] Referring Provider (PT): Reita May Date: 09/05/2020   PT End of Session - 09/05/20 0946    Visit Number 3    Number of Visits 8    Date for PT Re-Evaluation 10/12/20    PT Start Time 0903    PT Stop Time 0941    PT Time Calculation (min) 38 min    Activity Tolerance Patient tolerated treatment well    Behavior During Therapy [facility] for tasks assessed/performed           Past Medical History:  Diagnosis Date  . Hyperlipidemia   . Paroxysmal atrial fibrillation [facility])     Past Surgical History:  Procedure Laterality Date  . DIRECT LARYNGOSCOPY N/A 05/22/2020   Procedure: DIRECT LARYNGOSCOPY w/BIOPSY TONGUE BASE;  Surgeon: Izora Gala, MD;  Location: [place];  Service: ENT;  Laterality: N/A;  . IR GASTROSTOMY TUBE MOD SED  06/26/2020  . IR IMAGING GUIDED PORT INSERTION  06/26/2020  . TOTAL HIP ARTHROPLASTY  2012   right hip-Dr. Berenice Primas    There were no vitals filed for this visit.   Subjective Assessment - 09/05/20 0903    Subjective Nothing new.  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 No/denies                             [facility] Adult PT Treatment/Exercise - 09/05/20 0001      Exercises   Exercises Neck;Shoulder;Other Exercises    Other Exercises  Reviewed how pt is going to transition back to the country club gym and reminded him to not be aggressive with aerobics or weights until  body weight is starting to increase and calorie instake increases       Neck Exercises: Standing   Other Standing Exercises posture against wall and extension presses 3" x 10 added to HEP      Neck Exercises: Seated   Neck Retraction 10 reps    Neck Retraction Limitations with cueing for ears over shoulders     W Back Limitations chin tuck with ER AROM x 5     Shoulder Rolls 15 reps      Neck Exercises: Supine   Cervical Isometrics Extension;5 secs;10 reps    Cervical Isometrics Limitations only quick review of steps needed.  Good performance     Neck Retraction 10 reps;5 secs    Neck Retraction Limitations no cueing needed.  Performed with good form on pillow     Cervical Rotation Both;5 reps    Cervical Rotation Limitations with small extension press into purple ball     Other Supine Exercise isometric palms up bil extension 5" x 10     Other Supine Exercise bil protraction/retraction x 10 bil with initial cueing       Lumbar Exercises: Supine   Pelvic Tilt 10 reps;5 seconds    Pelvic Tilt Limitations requiring max vcs and tcs to decrease large muscle recruitment and pelvic lift.  better with cueing     Bridge 10 reps;3 seconds                       PT Long Term Goals - 09/05/20 0948      PT LONG TERM GOAL #1   Title Pt will return to baseline ROM measurements and will not demonstrate any signs of lymphedema to allow pt to return to PLOF.    Status Achieved      PT LONG TERM GOAL #2   Title Pt will be able to sit upright with good posture (alignment of head over shoulders) without v/c to self correct.    Status On-going      PT LONG TERM GOAL #3   Title Pt will be indepdent in a home exercise program for continued strengthening and stretching.    Status Achieved                 Plan - 09/05/20 0946    Clinical Impression Statement Pt is able to perform current HEP well needing only vcs/tcs for changing PPT but able to perform correctly with  understanding.  Added seated and standing wall postural TE today which was also tolerated well.  Pt is very active with golf and using his country club gym.  Discussed independently doing these postural TE at home until feeding tube is removed and then pt starting slow and low at the home gym.  PT agreed to this as pt demonstrates exercise understanding and safety.    PT Treatment/Interventions ADLs/Self Care Home Management;Therapeutic exercise;Patient/family education;Manual techniques;Passive range of motion    PT Home Exercise Plan head and neck ROM exercises;[id number]    Consulted and Agree with Plan of Care Patient           Patient will benefit from skilled therapeutic intervention in order to improve the following deficits and impairments:     Visit Diagnosis: Abnormal posture  Malignant neoplasm of base of tongue ([place])     Problem List Patient Active Problem List   Diagnosis Date Noted  . Port-A-Cath in place 07/17/2020  . Squamous cell carcinoma of base of tongue ([place]) 06/12/2020  . Changing skin lesion 01/07/2019  . Sebaceous cyst 01/07/2019  . Syncope 12/14/2014  . CAP (community acquired pneumonia) 12/14/2014  . Right lower lobe pneumonia 12/14/2014  . Parapneumonic effusion 12/14/2014  . Paroxysmal atrial fibrillation ([place]) 01/25/2014  . Hyperlipidemia 01/25/2014    Stark Bray 09/05/2020, 9:49 AM  [place], [place], [address] Phone: [phone]   Fax:  [phone]  Name: Patrick Nelson MRN: [id number] Date of Birth: 1950-10-09

## 2020-09-06 ENCOUNTER — Ambulatory Visit: Payer: Medicare Other | Admitting: Hematology and Oncology

## 2020-09-06 ENCOUNTER — Inpatient Hospital Stay: Payer: Medicare Other

## 2020-09-06 ENCOUNTER — Telehealth: Payer: Self-pay | Admitting: Hematology and Oncology

## 2020-09-06 NOTE — Telephone Encounter (Signed)
Scheduled per los. Called and left msg. Mailed printout  °

## 2020-09-07 ENCOUNTER — Telehealth: Payer: Self-pay

## 2020-09-07 ENCOUNTER — Telehealth: Payer: Self-pay | Admitting: *Deleted

## 2020-09-07 NOTE — Progress Notes (Incomplete)
Mr. Idrovo presents today for 2 week follow-up after completing radiation to his base of tongue on 08/18/2020  Pain issues, if any: *** Using a feeding tube?: ***Per Barb Neff-RD on 08/30/2020: "Patient continue to meet criteria for severe malnutrition. He reports he is tolerating 4 cartons of [employer] 1.4 daily.  Sometimes he can tolerate 4-1/2 cartons.  He verbalizes goal rate of 5 cartons [employer] daily. He continues to consume additional water via PEG.  He flushes feeding tube with 180 mL free water 4 times daily. Provided support for patient to continue increasing [employer] 1.4 to goal rate of 5 cartons daily to provide 2275 cal, 100 g protein, 2370 mL free water meeting 100% estimated needs. Patient can add an additional 8 to 10 ounces of water daily to reduce the need for additional IV fluids if tolerated. Educated patient on strategies for increasing oral intake as healing occurs." Weight changes, if any:  *3 weight* Swallowing issues, if any: *** Smoking or chewing tobacco? *** Using fluoride trays daily? *** Last ENT visit was on: Not since diagnosis Other notable issues, if any: Scheduled for F/U with Dr. Narda Rutherford on 09/15/2020   *vitals*

## 2020-09-07 NOTE — Telephone Encounter (Signed)
Received call from patient. He states he was doing his physical therapy exercises at home yesterday. He thinks he did something wrong to his left leg-thigh area as it is now very painful. He states it is difficult to walk due to the pain. Denies any swelling, redness or areas that are warm to the touch He states he made an appt to see his orthopedic doctor tomorrow morning. He just wanted to make sure that was the right thing to do. Advised that it sounded like the right thing to do. Encouraged patient to call back tomorrow with report from his orthopedic doctor.  He stated he would.

## 2020-09-07 NOTE — Telephone Encounter (Signed)
Received VM from patient's significant other Butch Penny that patient injured his left leg, and would need to reschedule his F/U with Dr. Isidore Moos to another date since he is now scheduled to be evaluated by orthopedist tomorrow around same time. Will pass information along to Dr. Isidore Moos and Shirley-NS so patient can be contacted and appointment moved.

## 2020-09-08 ENCOUNTER — Telehealth: Payer: Self-pay | Admitting: *Deleted

## 2020-09-08 ENCOUNTER — Ambulatory Visit: Payer: Medicare Other

## 2020-09-08 ENCOUNTER — Other Ambulatory Visit: Payer: Self-pay | Admitting: *Deleted

## 2020-09-08 ENCOUNTER — Ambulatory Visit: Payer: Medicare Other | Admitting: Radiation Oncology

## 2020-09-08 NOTE — Telephone Encounter (Signed)
Called patient to cancel today's appt., due to going to the orthopedist, patient will call back and reschedule this appt. after he sees the orthopedist

## 2020-09-08 NOTE — Telephone Encounter (Signed)
Received call from pt's wife. 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  They want to schedule him for a steroid injection but asked for medical clearance from Dr. Lorenso Courier. Spoke with Dr. Lorenso Courier. He is fine with pt receiving steroid injection to his hip. No contraindications from oncological perspective.  MD @ [facility] was sent notification regarding the above

## 2020-09-12 ENCOUNTER — Encounter: Payer: Medicare Other | Admitting: Rehabilitation

## 2020-09-12 ENCOUNTER — Telehealth: Payer: Self-pay | Admitting: Dietician

## 2020-09-12 NOTE — Telephone Encounter (Signed)
Nutrition Note  RD attempted to call patient for nutrition follow-up at 1:43 PM, however no answer. Request for return call with call-back number left on voicemail.  Lajuan Lines, RD, LDN Clinical Nutrition After Hours/Weekend Pager # in [place]

## 2020-09-13 ENCOUNTER — Other Ambulatory Visit: Payer: Self-pay

## 2020-09-13 ENCOUNTER — Ambulatory Visit: Payer: Medicare Other

## 2020-09-13 DIAGNOSIS — R1310 Dysphagia, unspecified: Secondary | ICD-10-CM

## 2020-09-13 DIAGNOSIS — R293 Abnormal posture: Secondary | ICD-10-CM | POA: Diagnosis not present

## 2020-09-13 NOTE — Therapy (Signed)
[facility] [address] [place], [place], [address] Phone: [phone]   Fax:  [phone]  Speech Language Pathology Treatment  Patient Details  Name: Patrick Nelson MRN: [id number] Date of Birth: 1950/11/24 Referring Provider (SLP): Eppie Gibson, MD   Encounter Date: 09/13/2020   End of Session - 09/13/20 0920    Visit Number 2    Number of Visits 7    Date for SLP Re-Evaluation 10/18/20   90 days   SLP Start Time 0808    SLP Stop Time  0848    SLP Time Calculation (min) 40 min    Activity Tolerance Patient tolerated treatment well           Past Medical History:  Diagnosis Date  . Hyperlipidemia   . Paroxysmal atrial fibrillation [facility])     Past Surgical History:  Procedure Laterality Date  . DIRECT LARYNGOSCOPY N/A 05/22/2020   Procedure: DIRECT LARYNGOSCOPY w/BIOPSY TONGUE BASE;  Surgeon: Izora Gala, MD;  Location: [facility];  Service: ENT;  Laterality: N/A;  . IR GASTROSTOMY TUBE MOD SED  06/26/2020  . IR IMAGING GUIDED PORT INSERTION  06/26/2020  . TOTAL HIP ARTHROPLASTY  2012   right hip-Dr. Berenice Primas    There were no vitals filed for this visit.   Subjective Assessment - 09/13/20 0812    Subjective Pt has been eating applesauce, pudding, chicken and rice soup, eggs.    Currently in Pain? No/denies                 ADULT SLP TREATMENT - 09/13/20 0813      General Information   Behavior/Cognition Pleasant mood;Cooperative;Alert      Treatment Provided   Treatment provided Dysphagia      Dysphagia Treatment   Temperature Spikes Noted No    Respiratory Status Room air    Treatment Methods Therapeutic exercise;Compensation strategy training;Patient/caregiver education    Patient observed directly with PO's Yes    Type of PO's observed Dysphagia 1 (puree);Thin liquids    Liquids provided via Cup    Oral Phase Signs & Symptoms Other (comment)   none noted   Pharyngeal  Phase Signs & Symptoms Other (comment)   none noted   Other treatment/comments mod A consistently necessary with HEP - pt stated he did HEP 3 days a week (suboptimal frequency), and he looked at instructions for each exercise in order to complete today. SLP provided total A for HEP rationale, and educated pt and caregiver on food journal benefits. Pt return-demonstrated this for SLP. By session end pt perfomed HEP independnently. Education also on what types of foods to begin to trial; SLP strongly suggested dys I-II foods and try these x3/day.      Assessment / Recommendations / Plan   Plan Continue with current plan of care      Dysphagia Recommendations   Diet recommendations --   as tolerated   Medication Administration Whole meds with puree   or crushed with puree if possible     Progression Toward Goals   Progression toward goals Progressing toward goals            SLP Education - 09/13/20 0905    Education Details HEP procedure, rationale for HEP, eat dys I-II foods, food journal    Person(s) Educated Patient;Caregiver(s)    Methods Explanation;Demonstration;Verbal cues;Handout    Comprehension Verbalized understanding;Returned demonstration;Verbal cues required;Need further instruction  SLP Short Term Goals - 09/13/20 5284      SLP SHORT TERM GOAL #1   Title pt will complete HEP with rare min A    Period --   sessions, for all STGs   Status Not Met      SLP SHORT TERM GOAL #2   Title pt will tell SLP why pt is completing HEP with modified independence    Status Not Met      SLP SHORT TERM GOAL #3   Title pt will tell SLP how a food journal could hasten return to a more normalized diet    Status Achieved            SLP Long Term Goals - 09/13/20 0924      SLP LONG TERM GOAL #1   Title pt will complete HEP with modified independence over two visits    Time 3    Period --   sessions, for all LTGs   Status On-going      SLP LONG TERM GOAL #2    Title pt will describe 3 overt s/s aspiration PNA with modified independence    Time 3    Status On-going      SLP LONG TERM GOAL #3   Title pt will describe how to modify HEP over time, and the timeline associated with reduction in HEP frequency with modified independence over two sessions    Time 5    Status On-going            Plan - 09/13/20 1324    Clinical Impression Statement At this time pt swallowing is deemed WNL/WFL with applesauce (Dys I-II) and water.See "other comments" for more details aobut today's session. SLP reviewed pt's individualized HEP for dysphagia and pt completed each exercise on their own with eventually (pt req'd consistent SLP cues for each exercise). There are no overt s/s aspiration PNA reported by pt at this time. Data indicate that pt's swallow ability will likely decrease over the course of radiation therapy and could very well decline over time following conclusion of their radiation therapy due to muscle disuse atrophy and/or muscle fibrosis. Pt will cont to need to be seen by SLP in order to assess safety of PO intake, assess the need for recommending any objective swallow assessment, and ensuring pt correctly completes the individualized HEP.    Speech Therapy Frequency --   once every approx 4 weeks   Duration --   7 total sessions   Treatment/Interventions Aspiration precaution training;Pharyngeal strengthening exercises;Compensatory techniques;Diet toleration management by SLP;Trials of upgraded texture/liquids;Internal/external aids;Patient/family education;SLP instruction and feedback    Potential to Achieve Goals Good    SLP Home Exercise Plan provided    Consulted and Agree with Plan of Care Patient           Patient will benefit from skilled therapeutic intervention in order to improve the following deficits and impairments:   Dysphagia, unspecified type    Problem List Patient Active Problem List   Diagnosis Date Noted  . Port-A-Cath  in place 07/17/2020  . Squamous cell carcinoma of base of tongue ([place]) 06/12/2020  . Changing skin lesion 01/07/2019  . Sebaceous cyst 01/07/2019  . Syncope 12/14/2014  . CAP (community acquired pneumonia) 12/14/2014  . Right lower lobe pneumonia 12/14/2014  . Parapneumonic effusion 12/14/2014  . Paroxysmal atrial fibrillation ([place]) 01/25/2014  . Hyperlipidemia 01/25/2014    Paquita Printy ,MS, [place]  09/13/2020, 9:25 AM  [facility]  [address] [place], [place], [address] Phone: [phone]   Fax:  [phone]   Name: Patrick Nelson MRN: [id number] Date of Birth: 25-Jan-1951

## 2020-09-15 ENCOUNTER — Inpatient Hospital Stay (HOSPITAL_BASED_OUTPATIENT_CLINIC_OR_DEPARTMENT_OTHER): Payer: Medicare Other | Admitting: Hematology and Oncology

## 2020-09-15 ENCOUNTER — Other Ambulatory Visit: Payer: Self-pay

## 2020-09-15 ENCOUNTER — Inpatient Hospital Stay: Payer: Medicare Other

## 2020-09-15 VITALS — BP 128/85 | HR 67 | Temp 97.7°F | Resp 18 | Ht 72.0 in | Wt 153.0 lb

## 2020-09-15 DIAGNOSIS — C01 Malignant neoplasm of base of tongue: Secondary | ICD-10-CM

## 2020-09-15 DIAGNOSIS — Z95828 Presence of other vascular implants and grafts: Secondary | ICD-10-CM | POA: Diagnosis not present

## 2020-09-15 LAB — CBC WITH DIFFERENTIAL (CANCER CENTER ONLY)
Abs Immature Granulocytes: 0.19 10*3/uL — ABNORMAL HIGH (ref 0.00–0.07)
Basophils Absolute: 0 10*3/uL (ref 0.0–0.1)
Basophils Relative: 1 %
Eosinophils Absolute: 0 10*3/uL (ref 0.0–0.5)
Eosinophils Relative: 0 %
HCT: 32.9 % — ABNORMAL LOW (ref 39.0–52.0)
Hemoglobin: 11.5 g/dL — ABNORMAL LOW (ref 13.0–17.0)
Immature Granulocytes: 4 %
Lymphocytes Relative: 24 %
Lymphs Abs: 1.3 10*3/uL (ref 0.7–4.0)
MCH: 34 pg (ref 26.0–34.0)
MCHC: 35 g/dL (ref 30.0–36.0)
MCV: 97.3 fL (ref 80.0–100.0)
Monocytes Absolute: 0.9 10*3/uL (ref 0.1–1.0)
Monocytes Relative: 17 %
Neutro Abs: 3 10*3/uL (ref 1.7–7.7)
Neutrophils Relative %: 54 %
Platelet Count: 229 10*3/uL (ref 150–400)
RBC: 3.38 MIL/uL — ABNORMAL LOW (ref 4.22–5.81)
RDW: 17.4 % — ABNORMAL HIGH (ref 11.5–15.5)
WBC Count: 5.4 10*3/uL (ref 4.0–10.5)
nRBC: 0 % (ref 0.0–0.2)

## 2020-09-15 LAB — CMP (CANCER CENTER ONLY)
ALT: 14 U/L (ref 0–44)
AST: 16 U/L (ref 15–41)
Albumin: 3.4 g/dL — ABNORMAL LOW (ref 3.5–5.0)
Alkaline Phosphatase: 78 U/L (ref 38–126)
Anion gap: 7 (ref 5–15)
BUN: 19 mg/dL (ref 8–23)
CO2: 28 mmol/L (ref 22–32)
Calcium: 9.4 mg/dL (ref 8.9–10.3)
Chloride: 95 mmol/L — ABNORMAL LOW (ref 98–111)
Creatinine: 0.78 mg/dL (ref 0.61–1.24)
GFR, Estimated: >60 mL/min (ref >60)
Glucose, Bld: 100 mg/dL — ABNORMAL HIGH (ref 70–99)
Potassium: 4.5 mmol/L (ref 3.5–5.1)
Sodium: 130 mmol/L — ABNORMAL LOW (ref 135–145)
Total Bilirubin: 0.4 mg/dL (ref 0.3–1.2)
Total Protein: 6.1 g/dL — ABNORMAL LOW (ref 6.5–8.1)

## 2020-09-15 LAB — MAGNESIUM: Magnesium: 1.8 mg/dL (ref 1.7–2.4)

## 2020-09-18 ENCOUNTER — Encounter: Payer: Medicare Other | Admitting: Physical Therapy

## 2020-09-20 NOTE — Progress Notes (Signed)
  Patient Name: Patrick Nelson MRN: [id number] DOB: July 28, 1951 Referring Physician: Izora Gala (Profile Not Attached) Date of Service: 08/18/2020 [facility], [place]                                                        End Of Treatment Note  Diagnoses: C01-Malignant neoplasm of base of tongue  Cancer Staging: Cancer Staging Squamous cell carcinoma of base of tongue ([place]) Staging form: Pharynx - HPV-Mediated Oropharynx, AJCC 8th Edition - Clinical stage from 06/21/2020: Stage I (cT1, cN1, cM0, p16+) - Signed by Eppie Gibson, MD on 09/20/2020  Intent: Curative  Radiation Treatment Dates: 07/03/2020 through 08/18/2020 Site Technique Total Dose (Gy) Dose per Fx (Gy) Completed Fx Beam Energies  Oropharynx: HN_BOT IMRT 70/70 2 35/35 6X   Narrative: The patient tolerated radiation therapy relatively well.   Plan: The patient will follow-up with radiation oncology in 2-3wks .  -----------------------------------  Eppie Gibson, MD

## 2020-09-22 ENCOUNTER — Encounter: Payer: Self-pay | Admitting: Hematology and Oncology

## 2020-09-22 NOTE — Progress Notes (Signed)
[name] [phone]   [phone]  PROGRESS NOTE  Patient Care Team: Wenda Low, MD as PCP - General (Internal Medicine) Malmfelt, Stephani Police, RN as Oncology Nurse Navigator Eppie Gibson, MD as Consulting Physician (Radiation Oncology) Orson Slick, MD as Consulting Physician (Hematology and Oncology) Sharen Counter, CCC-SLP as Speech Language Pathologist (Speech Pathology) Wynelle Beckmann, Melodie Bouillon, PT as Physical Therapist (Physical Therapy) Beverely Pace, LCSW as Social Worker (General Practice) Karie Mainland, RD as Dietitian (Nutrition) Izora Gala, MD as Consulting Physician (Otolaryngology)  Hematological/Oncological History 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 Mass biopsied at the right base of the tongue. Biopsy showed invasive moderately differentiated squamous cell carcinoma 3) 06/06/2020: PET CT scan showed hypermetabolic right tongue base mass consistent with known neoplasm. Right level 2 and level 3 metastatic adenopathy. No evidence of metastatic disease.  4) 06/09/2020: established care with Dr. Isidore Moos 5) 06/12/2020: establish care with Dr. Lorenso Courier  6) 07/03/2020: Week 1 of Cisplatin chemoradiation 7) 07/10/2020: Week 2 of Cisplatin chemoradiation 8) 07/17/2020: Week 3 of Cisplatin chemoradiation 9) 07/24/2020: Week 4 of Cisplatin chemoradiation 10) 07/31/2020: Week 5 of Cisplatin chemoradiation 11) 08/07/2020: Week 6 of Cisplatin chemoradiation 12) 08/14/2020: Week 7 of Cisplatin chemoradiation 13) 08/18/2020: end of radiation therapy.   Interval History:  Patrick Nelson 69 y.o. male with medical history significant for SCC of the base of the tongue who presents for a  follow up visit. The patient's last visit was on 09/01/2020. In the interim since the last visit he has had no major changes in his health.  On exam today Patrick Nelson notes he feels improved from prior. He reports that he is taking 90% of his nutrition by G-tube, and is taking in some soft foods by mouth, including sweet potatoes, oatmeal, and blueberries.  He notes that he has not been enjoying putting as it has been too thick.  He reports that his throat has remained sore and he has no desire to eat.  Fortunately the skin is improving around his neck and the hip pain that he was having has recently been treated with a steroid shot.  He endorses that his bowels are moving well and he has no additional questions concerns or complaints.  A full 10 point ROS is listed below.  MEDICAL HISTORY:  Past Medical History:  Diagnosis Date  . Hyperlipidemia   . Paroxysmal atrial fibrillation ([place])     SURGICAL HISTORY: Past Surgical History:  Procedure Laterality Date  . DIRECT LARYNGOSCOPY N/A 05/22/2020   Procedure: DIRECT LARYNGOSCOPY w/BIOPSY TONGUE BASE;  Surgeon: Izora Gala, MD;  Location: [facility];  Service: ENT;  Laterality: N/A;  . IR GASTROSTOMY TUBE MOD SED  06/26/2020  . IR IMAGING GUIDED PORT INSERTION  06/26/2020  . TOTAL HIP ARTHROPLASTY  2012   right hip-Dr. Berenice Primas    SOCIAL HISTORY: Social History   Socioeconomic History  . Marital status: Significant Other    Spouse name: Not on file  . Number of children: 1  . Years of education: Not on file  . Highest education level: Not on file  Occupational History  . Not on file  Tobacco Use  .  Smoking status: Never Smoker  . Smokeless tobacco: Never Used  Vaping Use  . Vaping Use: Never used  Substance and Sexual Activity  . Alcohol use: Yes  . Drug use: No  . Sexual activity: Yes  Other Topics Concern  . Not on file  Social History Narrative  . Not on file   Social Determinants of Health   Financial  Resource Strain: Low Risk   . Difficulty of Paying Living Expenses: Not hard at all  Food Insecurity: No Food Insecurity  . Worried About Charity fundraiser in the Last Year: Never true  . Ran Out of Food in the Last Year: Never true  Transportation Needs: No Transportation Needs  . Lack of Transportation (Medical): No  . Lack of Transportation (Non-Medical): No  Physical Activity: Not on file  Stress: Not on file  Social Connections: Moderately Isolated  . Frequency of Communication with Friends and Family: More than three times a week  . Frequency of Social Gatherings with Friends and Family: More than three times a week  . Attends Religious Services: Never  . Active Member of Clubs or Organizations: No  . Attends Archivist Meetings: Not on file  . Marital Status: Married  Human resources officer Violence: Not on file    FAMILY HISTORY: Family History  Problem Relation Age of Onset  . Hypertension Mother   . Heart disease Mother   . Heart disease Brother     ALLERGIES:  has No Known Allergies.  MEDICATIONS:  Current Outpatient Medications  Medication Sig Dispense Refill  . amoxicillin (AMOXIL) 500 MG capsule Take four capsules one hour before dental appointment. 4 capsule 3  . aspirin 81 MG chewable tablet Chew by mouth daily.     Marland Kitchen atorvastatin (LIPITOR) 10 MG tablet Take 10 mg by mouth daily.    . Cholecalciferol (VITAMIN D3 SUPER STRENGTH) 50 MCG (2000 UT) TABS Take 2,000 Units by mouth.    . cyanocobalamin 1000 MCG tablet Take 1,000 mcg by mouth daily.    . folic acid (FOLVITE) A999333 MCG tablet Take 400 mcg by mouth daily.    Marland Kitchen HYDROcodone-acetaminophen (NORCO/VICODIN) 5-325 MG tablet Take 1 tablet by mouth every 6 (six) hours as needed.    . lidocaine (XYLOCAINE) 2 % solution Patient: Mix 1part 2% viscous lidocaine, 1part H20. Swish & swallow 80mL of diluted mixture, 21min before meals and at bedtime, up to QID 200 mL 5  . lidocaine-prilocaine (EMLA) cream Apply 1  application topically as needed. 30 g 0  . LORazepam (ATIVAN) 0.5 MG tablet Take 1 tab 30 min before radiotherapy, prn anxiety.  Will need a driver if taking this medication. 20 tablet 0  . melatonin 5 MG TABS Take 5 mg by mouth at bedtime.    . Misc Natural Products (CHROMIUM PICOLINATE FORTIFIED PO) Take by mouth.    . Nutritional Supplements (ANTI-INFLAMMATORY ENZYME) CAPS Take by mouth. Recover AI    . Nutritional Supplements (KATE FARMS STANDARD 1.4) LIQD 1 Container by Enteral route 5 (five) times daily. 1625 mL 8  . ondansetron (ZOFRAN) 8 MG tablet Take 1 tablet (8 mg total) by mouth every 8 (eight) hours as needed for nausea or vomiting. 20 tablet 0  . oxyCODONE (ROXICODONE) 5 MG/5ML solution Take 5 mLs (5 mg total) by mouth every 6 (six) hours as needed for severe pain. 200 mL 0  . prochlorperazine (COMPAZINE) 10 MG tablet Take 1 tablet (10 mg total) by mouth every 6 (six) hours  as needed for nausea or vomiting. 30 tablet 0  . Sennosides (SENNA) 8.8 MG/5ML LIQD Take 10 mLs by mouth 2 (two) times daily as needed. 105 mL 1  . sodium fluoride (PREVIDENT 5000 PLUS) 1.1 % CREA dental cream Apply to tooth brush. Brush teeth for 2 minutes. Spit out excess-DO NOT swallow. Repeat nightly. 102 g prn   No current facility-administered medications for this visit.    REVIEW OF SYSTEMS:   Constitutional: ( - ) fevers, ( - )  chills , ( - ) night sweats Eyes: ( - ) blurriness of vision, ( - ) double vision, ( - ) watery eyes Ears, nose, mouth, throat, and face: ( - ) mucositis, ( - ) sore throat Respiratory: ( - ) cough, ( - ) dyspnea, ( - ) wheezes Cardiovascular: ( - ) palpitation, ( - ) chest discomfort, ( - ) lower extremity swelling Gastrointestinal:  ( - ) nausea, ( - ) heartburn, ( - ) change in bowel habits Skin: ( - ) abnormal skin rashes Lymphatics: ( - ) new lymphadenopathy, ( - ) easy bruising Neurological: ( - ) numbness, ( - ) tingling, ( - ) new weaknesses Behavioral/Psych: ( - )  mood change, ( - ) new changes  All other systems were reviewed with the patient and are negative.  PHYSICAL EXAMINATION: ECOG PERFORMANCE STATUS: 0 - Asymptomatic  Vitals:   09/15/20 1518  BP: 128/85  Pulse: 67  Resp: 18  Temp: 97.7 F (36.5 C)  SpO2: 100%   Filed Weights   09/15/20 1518  Weight: 153 lb (69.4 kg)    GENERAL: well appearing elderly Caucasian male alert, no distress and comfortable SKIN:  Worsening erythema/skin drying around neck.  EYES: conjunctiva are pink and non-injected, sclera clear OROPHARYNX: thick ropey saliva, but no erythema noted.  LUNGS: clear to auscultation and percussion with normal breathing effort HEART: regular rate & rhythm and no murmurs and no lower extremity edema Musculoskeletal: no cyanosis of digits and no clubbing  PSYCH: alert & oriented x 3, fluent speech NEURO: no focal motor/sensory deficits  LABORATORY DATA:  I have reviewed the data as listed CBC Latest Ref Rng & Units 09/15/2020 09/01/2020 08/14/2020  WBC 4.0 - 10.5 K/uL 5.4 3.1(L) 2.9(L)  Hemoglobin 13.0 - 17.0 g/dL 11.5(L) 9.3(L) 11.6(L)  Hematocrit 39.0 - 52.0 % 32.9(L) 26.8(L) 33.0(L)  Platelets 150 - 400 K/uL 229 174 144(L)    CMP Latest Ref Rng & Units 09/15/2020 09/01/2020 08/14/2020  Glucose 70 - 99 mg/dL 100(H) 89 112(H)  BUN 8 - 23 mg/dL 19 17 20   Creatinine 0.61 - 1.24 mg/dL 0.78 0.62 0.81  Sodium 135 - 145 mmol/L 130(L) 132(L) 130(L)  Potassium 3.5 - 5.1 mmol/L 4.5 4.2 4.2  Chloride 98 - 111 mmol/L 95(L) 100 97(L)  CO2 22 - 32 mmol/L 28 24 25   Calcium 8.9 - 10.3 mg/dL 9.4 8.6(L) 9.4  Total Protein 6.5 - 8.1 g/dL 6.1(L) 5.2(L) 6.2(L)  Total Bilirubin 0.3 - 1.2 mg/dL 0.4 0.3 0.8  Alkaline Phos 38 - 126 U/L 78 79 98  AST 15 - 41 U/L 16 16 14(L)  ALT 0 - 44 U/L 14 8 13     RADIOGRAPHIC STUDIES: No results found.  ASSESSMENT & PLAN Patrick Nelson 69 y.o. male with medical history significant for SCC of the base of the tongue who presents for a follow up  visit.  Review the labs, review the records, discussion with the patient the findings are most consistent  with a squamous cell carcinoma of the back base of the tongue.  This was a T1 N1 M0 p16 positive cancer which makes it stage I.  Overall the prognosis for this is quite well and there is an excellent chance of durable remission after chemoradiation therapy with cisplatin.   On exam today Patrick Nelson is taking in 90% of his nutrition via G-tube (with 10% soft foods).  He is not having any fevers, chills, sweats.  The rash around his neck has nearly resolved. Overall he appears improved from prior.   Previously we discussed treatment moving forward and the expected side effects and symptoms.  Side effects include but are not limited to fatigue, anemia, kidney dysfunction, nausea, vomiting, diarrhea, throat pain, weight loss, and neuropathy.  Patient his wife were agreeable to proceeding with treatment.  We also discussed the medications to help with potential side effects including Zofran as needed, Compazine as needed, and EMLA cream.    The treatment of choice for this patient will be radiation with concurrent cisplatin 40 mg per metered squared weekly x7 doses.  We started this on 07/03/2020 and had weekly visits with the patient while he is receiving this treatment. He completed chemotherapy on 08/14/2020.   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 P16+, Stage I  --patient completed 7 weekly doses of cisplatin 40mg /m2 administered with radiation. --supportive care as noted below. --at each visit check CMP, CBC and periodic TSH  --plan to see patient in 4 weeks time. (interval visit with Rad Onc on 10/06/2020)   #Pain Control --prescribed liquid roxicet 21ml q 6H PRN. No using.   #Supportive Care --patient provided with zofran 8mg  q8H PRN and compazine 10mg  q6H PRN.  --EMLA cream for port provided --PEG and Port in place. Appreciate assistance from dietary regarding tube  feeds --pain control as above --erythematous radiation rash improving with topical treatments.   No orders of the defined types were placed in this encounter.  All questions were answered. The patient knows to call the clinic with any problems, questions or concerns.  A total of more than 30 minutes were spent on this encounter and over half of that time was spent on counseling and coordination of care as outlined above.   Ledell Peoples, MD Department of Hematology/Oncology [place] [facility] Phone: [phone] Pager: [phone] Email: [email or web address]  09/22/2020 11:14 AM

## 2020-09-25 ENCOUNTER — Telehealth: Payer: Self-pay | Admitting: Hematology and Oncology

## 2020-09-25 NOTE — Telephone Encounter (Signed)
Scheduled follow-up appointments per 12/24 schedule message. Patient is aware.

## 2020-09-26 ENCOUNTER — Encounter: Payer: Medicare Other | Admitting: Rehabilitation

## 2020-10-02 ENCOUNTER — Telehealth: Payer: Self-pay | Admitting: *Deleted

## 2020-10-02 NOTE — Telephone Encounter (Signed)
CALLED PATIENT TO ASK ABOUT HAVING A TELEMEDICINE VISIT INSTEAD OF COMING IN FOR FU ON 10-06-20, PATIENT AGREED TO HAVE A TELEPHONE VISIT

## 2020-10-03 ENCOUNTER — Encounter: Payer: Medicare Other | Admitting: Physical Therapy

## 2020-10-06 ENCOUNTER — Ambulatory Visit
Admission: RE | Admit: 2020-10-06 | Discharge: 2020-10-06 | Disposition: A | Discharge status: Home / Self Care | Payer: Medicare Other | Source: Ambulatory Visit | Attending: Radiation Oncology | Admitting: Radiation Oncology

## 2020-10-06 ENCOUNTER — Other Ambulatory Visit: Payer: Self-pay

## 2020-10-06 ENCOUNTER — Encounter: Payer: Self-pay | Admitting: Radiation Oncology

## 2020-10-06 DIAGNOSIS — C01 Malignant neoplasm of base of tongue: Secondary | ICD-10-CM

## 2020-10-06 NOTE — Progress Notes (Signed)
Radiation Oncology         [phone] ________________________________  Name: Patrick Nelson MRN: [id number]  Date: 10/06/2020  DOB: 1950/11/29  Follow-Up Visit Note by telephone.  The patient opted for telemedicine to maximize safety during the pandemic.  MyChart video was not obtainable.   CC: Wenda Low, MD  Izora Gala, MD  Diagnoses: C01-Malignant neoplasm of base of tongue  Cancer Staging: Cancer Staging Squamous cell carcinoma of base of tongue ([place]) Staging form: Pharynx - HPV-Mediated Oropharynx, AJCC 8th Edition - Clinical stage from 06/21/2020: Stage I (cT1, cN1, cM0, p16+) - Signed by Eppie Gibson, MD on 09/20/2020  Intent: Curative  Radiation Treatment Dates: 07/03/2020 through 08/18/2020 Site Technique Total Dose (Gy) Dose per Fx (Gy) Completed Fx Beam Energies  Oropharynx: HN_BOT IMRT 70/70 2 35/35 6X    CHIEF COMPLAINT:  Here for follow-up and surveillance of throat cancer  Narrative:  The patient returns today for routine follow-up.  He has some dry mouth and soreness of mucosa, but he is eating TID by mouth.  Overall he is pleased with how he is doing.  He is walking 2 miles on most days.   His skin is healing well.  No new masses in his neck.                ALLERGIES:  has No Known Allergies.  Meds: Current Outpatient Medications  Medication Sig Dispense Refill  . amoxicillin (AMOXIL) 500 MG capsule Take four capsules one hour before dental appointment. 4 capsule 3  . aspirin 81 MG chewable tablet Chew by mouth daily.     Marland Kitchen atorvastatin (LIPITOR) 10 MG tablet Take 10 mg by mouth daily.    . Cholecalciferol (VITAMIN D3 SUPER STRENGTH) 50 MCG (2000 UT) TABS Take 2,000 Units by mouth.    . cyanocobalamin 1000 MCG tablet Take 1,000 mcg by mouth daily.    . folic acid (FOLVITE) 101 MCG tablet Take 400 mcg by mouth daily.    Marland Kitchen HYDROcodone-acetaminophen (NORCO/VICODIN) 5-325 MG tablet Take 1 tablet by mouth every 6 (six) hours as needed.    . lidocaine  (XYLOCAINE) 2 % solution Patient: Mix 1part 2% viscous lidocaine, 1part H20. Swish & swallow 51mL of diluted mixture, 33min before meals and at bedtime, up to QID 200 mL 5  . lidocaine-prilocaine (EMLA) cream Apply 1 application topically as needed. 30 g 0  . LORazepam (ATIVAN) 0.5 MG tablet Take 1 tab 30 min before radiotherapy, prn anxiety.  Will need a driver if taking this medication. 20 tablet 0  . melatonin 5 MG TABS Take 5 mg by mouth at bedtime.    . Misc Natural Products (CHROMIUM PICOLINATE FORTIFIED PO) Take by mouth.    . Nutritional Supplements (ANTI-INFLAMMATORY ENZYME) CAPS Take by mouth. Recover AI    . Nutritional Supplements (KATE FARMS STANDARD 1.4) LIQD 1 Container by Enteral route 5 (five) times daily. 1625 mL 8  . ondansetron (ZOFRAN) 8 MG tablet Take 1 tablet (8 mg total) by mouth every 8 (eight) hours as needed for nausea or vomiting. 20 tablet 0  . oxyCODONE (ROXICODONE) 5 MG/5ML solution Take 5 mLs (5 mg total) by mouth every 6 (six) hours as needed for severe pain. 200 mL 0  . prochlorperazine (COMPAZINE) 10 MG tablet Take 1 tablet (10 mg total) by mouth every 6 (six) hours as needed for nausea or vomiting. 30 tablet 0  . Sennosides (SENNA) 8.8 MG/5ML LIQD Take 10 mLs by mouth 2 (two) times daily  as needed. 105 mL 1  . sodium fluoride (PREVIDENT 5000 PLUS) 1.1 % CREA dental cream Apply to tooth brush. Brush teeth for 2 minutes. Spit out excess-DO NOT swallow. Repeat nightly. 102 g prn   No current facility-administered medications for this encounter.    Physical Findings: The patient is in no acute distress. Patient is alert and oriented. Wt Readings from Last 3 Encounters:  09/15/20 153 lb (69.4 kg)  09/01/20 152 lb 8 oz (69.2 kg)  08/30/20 153 lb 8 oz (69.6 kg)    vitals were not taken for this visit.     Lab Findings: Lab Results  Component Value Date   WBC 5.4 09/15/2020   HGB 11.5 (L) 09/15/2020   HCT 32.9 (L) 09/15/2020   MCV 97.3 09/15/2020   PLT 229  09/15/2020    Lab Results  Component Value Date   TSH 0.851 06/12/2020    Radiographic Findings: No results found.  Impression/Plan:    1) Head and Neck Cancer Status: He is healing from chemoradiation therapy  2) Nutritional Status:  Maintaining weight Wt Readings from Last 3 Encounters:  09/15/20 153 lb (69.4 kg)  09/01/20 152 lb 8 oz (69.2 kg)  08/30/20 153 lb 8 oz (69.6 kg)    PEG tube: Still intact, though he is eating 3 times a day.  He knows that when he maintains his weight without using his PEG for 1 month this can be removed  3) Risk Factors: The patient has been educated about risk factors including alcohol and tobacco abuse; they understand that avoidance of alcohol and tobacco is important to prevent recurrences as well as other cancers  4) Swallowing: Satisfactory function, continue swallowing exercises  5) Dental: Encouraged to continue regular followup with dentistry, and dental hygiene including fluoride rinses.   6) Thyroid function: Check annually Lab Results  Component Value Date   TSH 0.851 06/12/2020    7)  Follow-up in the beginning of March with restaging PET scan. The patient was encouraged to call with any issues or questions before then.  8) We discussed measures to reduce the risk of infection during the COVID-19 pandemic.  He has received his booster shot.  We talked about ways to minimize risk of catching the virus including wearing high-quality surgical grade masks and minimizing contacts as much as possible.  This encounter was provided by telemedicine platform; patient desired telemedicine during pandemic precautions.  MyChart video was not obtainable so telephone was used. The patient has given verbal consent for this type of encounter and has been advised to only accept a meeting of this type in a secure network environment. On date of service, in total, I spent 15 minutes on this encounter.   The attendants for this meeting include Eppie Gibson  and Ronni Rumble During the encounter, Eppie Gibson was located at [facility] Radiation Oncology Department.  [name] was located at home.   _____________________________________   Eppie Gibson, MD

## 2020-10-09 ENCOUNTER — Other Ambulatory Visit: Payer: Self-pay

## 2020-10-09 ENCOUNTER — Ambulatory Visit (HOSPITAL_COMMUNITY): Payer: Dental | Admitting: Dentistry

## 2020-10-09 ENCOUNTER — Encounter (HOSPITAL_COMMUNITY): Payer: Self-pay | Admitting: Dentistry

## 2020-10-09 DIAGNOSIS — R432 Parageusia: Secondary | ICD-10-CM | POA: Diagnosis not present

## 2020-10-09 DIAGNOSIS — R1310 Dysphagia, unspecified: Secondary | ICD-10-CM | POA: Diagnosis not present

## 2020-10-09 DIAGNOSIS — R252 Cramp and spasm: Secondary | ICD-10-CM

## 2020-10-09 DIAGNOSIS — K08199 Complete loss of teeth due to other specified cause, unspecified class: Secondary | ICD-10-CM

## 2020-10-09 DIAGNOSIS — R634 Abnormal weight loss: Secondary | ICD-10-CM

## 2020-10-09 NOTE — Progress Notes (Signed)
DENTAL VISIT PROGRESS NOTE   Date of Appointment:  10/09/2020 Patient Name:   Patrick Nelson Date of Birth:   [birth year] Medical Record Number: [id number]    COVID 19 SCREENING: The patient does not symptoms concerning for COVID-19 infection (Including fever, chills, cough, or new SHORTNESS OF BREATH).     . 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  Patient has completed 35 of 35 radiation treatments from 07/03/20 to 08/18/20.  Patient has completed 7 out of 7 chemotherapy treatments. . Reviewed medical history with the patient.  No changes reported.  VITALS: BP 109/76 (BP Location: Right Arm)   Pulse 62   Temp 98.3 F (36.8 C)   Wt 155 lb (70.3 kg)   BMI 21.02 kg/m    REVIEW OF CHIEF COMPLAINTS: DRY MOUTH: Yes HARD TO SWALLOW: Yes  HURT TO SWALLOW: Yes TASTE CHANGES: Taste is slowly returning SORES IN MOUTH: No TRISMUS SYMPTOMS: No WEIGHT: 155 lbs down from 170 lbs  HOME ORAL HYGIENE REGIMEN: BRUSHING: Twice daily FLOSSING: Daily RINSING: Rinses with water for dry mouth symptoms FLUORIDE: Uses at bedtime sometimes, not every  night TRISMUS EXERCISES: He is doing his exercises. He denies symptoms associated with limited opening. Maximum interincisal opening: 26 mm down from 37 mm   Patient Active Problem List   Diagnosis Date Noted  . Port-A-Cath in place 07/17/2020  . Squamous cell carcinoma of base of tongue ([place]) 06/12/2020  . Changing skin lesion 01/07/2019  . Sebaceous cyst 01/07/2019  . Syncope 12/14/2014  . CAP (community acquired pneumonia) 12/14/2014  . Right lower lobe pneumonia 12/14/2014  . Parapneumonic effusion 12/14/2014  . Paroxysmal atrial fibrillation ([place]) 01/25/2014  . Hyperlipidemia 01/25/2014   Past Medical History:  Diagnosis Date  . Hyperlipidemia   . Paroxysmal atrial fibrillation [facility])    Past Surgical History:  Procedure Laterality Date  . DIRECT LARYNGOSCOPY N/A  05/22/2020   Procedure: DIRECT LARYNGOSCOPY w/BIOPSY TONGUE BASE;  Surgeon: Izora Gala, MD;  Location: [place];  Service: ENT;  Laterality: N/A;  . IR GASTROSTOMY TUBE MOD SED  06/26/2020  . IR IMAGING GUIDED PORT INSERTION  06/26/2020  . TOTAL HIP ARTHROPLASTY  2012   right hip-Dr. Berenice Primas   Current Outpatient Medications  Medication Sig Dispense Refill  . amoxicillin (AMOXIL) 500 MG capsule Take four capsules one hour before dental appointment. 4 capsule 3  . aspirin 81 MG chewable tablet Chew by mouth daily.     Marland Kitchen atorvastatin (LIPITOR) 10 MG tablet Take 10 mg by mouth daily.    . Cholecalciferol (VITAMIN D3 SUPER STRENGTH) 50 MCG (2000 UT) TABS Take 2,000 Units by mouth.    . cyanocobalamin 1000 MCG tablet Take 1,000 mcg by mouth daily.    . folic acid (FOLVITE) 419 MCG tablet Take 400 mcg by mouth daily.    Marland Kitchen HYDROcodone-acetaminophen (NORCO/VICODIN) 5-325 MG tablet Take 1 tablet by mouth every 6 (six) hours as needed.    . lidocaine (XYLOCAINE) 2 % solution Patient: Mix 1part 2% viscous lidocaine, 1part H20. Swish & swallow 6mL of diluted mixture, 78min before meals and at bedtime, up to QID 200 mL 5  . lidocaine-prilocaine (EMLA) cream Apply 1 application topically as needed. 30 g 0  . LORazepam (ATIVAN) 0.5 MG tablet Take 1 tab 30 min before radiotherapy, prn anxiety.  Will need a driver if taking this medication. 20 tablet 0  . melatonin 5 MG TABS Take  5 mg by mouth at bedtime.    . Misc Natural Products (CHROMIUM PICOLINATE FORTIFIED PO) Take by mouth.    . Nutritional Supplements (ANTI-INFLAMMATORY ENZYME) CAPS Take by mouth. Recover AI    . Nutritional Supplements (KATE FARMS STANDARD 1.4) LIQD 1 Container by Enteral route 5 (five) times daily. 1625 mL 8  . ondansetron (ZOFRAN) 8 MG tablet Take 1 tablet (8 mg total) by mouth every 8 (eight) hours as needed for nausea or vomiting. 20 tablet 0  . oxyCODONE (ROXICODONE) 5 MG/5ML solution Take 5 mLs (5 mg total) by  mouth every 6 (six) hours as needed for severe pain. 200 mL 0  . prochlorperazine (COMPAZINE) 10 MG tablet Take 1 tablet (10 mg total) by mouth every 6 (six) hours as needed for nausea or vomiting. 30 tablet 0  . Sennosides (SENNA) 8.8 MG/5ML LIQD Take 10 mLs by mouth 2 (two) times daily as needed. 105 mL 1  . sodium fluoride (PREVIDENT 5000 PLUS) 1.1 % CREA dental cream Apply to tooth brush. Brush teeth for 2 minutes. Spit out excess-DO NOT swallow. Repeat nightly. 102 g prn   No current facility-administered medications for this visit.   No Known Allergies   DENTAL EXAM: Oral Hygiene:(PLAQUE): None LOCATION OF MUCOSITIS: None DESCRIPTION OF SALIVA: Viscous saliva with mild thickness; no foamy areas. ANY EXPOSED BONE: None OTHER WATCHED AREAS: #1 extraction site has healed up well and no signs of exposed bone, infection or edema/erythema.   DIAGNOSES: 1. Xerostomia: Patient reports he does have issues with dry mouth.  His symptoms are worse first thing in the morning.  He is using water to help.  I recommend he try CLOSYs brand Mouthrinse or Gel to see if this relieves his symptoms better. 2. Dysgeusia: His taste is slowly returning and continuing to improve. 3. Dysphagia: He reports since he has started eating normally again, he has noticed difficulty swallowing.  This should improve with time and practice. 4. Odynophagia: He reports the same issues with pain on swallowing.  This should also continue to improve with time.  Recommend monitoring his symptoms and make sure they do not continue to worsen. 5. Weight Loss: 155 lbs down from 170 lbs 6. 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  He reports doing his exercises and denies any symptoms related to trismus.   RECOMMENDATIONS: 1. Brush after meals and at bedtime.  Use fluoride at bedtime. 2. Use trismus exercises as directed. 3. Use CLOSYs for dry mouth as needed.  Use salt water/baking soda rinses for mouth sores. 4. Take  multiple sips of water as needed. 5. Return to regular dentist for routine dental care including cleanings and periodic exams.  If patient does not have a regular dentist, establish care at an outside office of his/her choice. The patient states he does have a dentist he sees regularly, and has an appointment scheduled for a cleaning and periodic exam.  We will send a Referral Back to DDS letter to his dentist. 6. Call if any problems or concerns arise.   . All questions and concerns were addressed, and patient verbalized understanding of discussion, findings and recommendations.   . Patient tolerated today's visit well and departed in stable condition.   [place], DMD

## 2020-10-09 NOTE — Patient Instructions (Signed)
[place] Department of Dental Medicine Dr. Debe Coder B. Benson Norway, DMD Phone: [phone] Fax: [phone]   It was a pleasure seeing you today!  Please refer to the information below regarding your dental visit with [place], and call us should you have any questions or concerns that may come up after you leave.   Thank you for giving [place] the opportunity to provide care for you.  If there is anything we can do for you, please let us know.    RECOMMENDATIONS: 1. Brush after meals and at bedtime.  Floss once a day, and use fluoride at bedtime. 2. Use trismus exercises as directed below. 3. Use CLOSYs or Biotine for dry mouth (whichever works for you).  Use salt water/baking soda rinses to help with any mouth sores. 4. Take multiple sips of water as needed.  Stay hydrated. 5. Return to your regular dentist for routine dental care including cleanings and periodic exams.  If you do not have a regular dentist, it is important to establish care at an outside dental office of your choice.  6. Call us if any problems or concerns arise.  TRISMUS Trismus is a condition where the jaw does not allow the mouth to open as wide as it usually does.  This can happen almost suddenly, or in other cases the process is so slow, it is hard to notice it-until it is too far along.  When the jaw joints and/or muscles have been exposed to radiation treatments, the onset of Trismus is very slow.  This is because the muscles are losing their stretching ability over a long period of time, as long as 2 YEARS after the end of radiation.  It is therefore important to exercise these muscles and joints.  TRISMUS EXERCISES:  Quinn Axe of tongue depressors measuring the same or a little less than the last documented MIO (Maximum Interincisal Opening).  Secure them with a rubber band on both ends.  Place the stack in the patient's mouth, supporting the other end.  Allow 30 seconds for muscle stretching.  Rest for a few  seconds.  Repeat 3-5 times.  For all radiation patients, this exercise is recommended in the mornings and evenings unless otherwise instructed.  The exercise should be done for a period of 2 YEARS after the end of radiation.  Maximum jaw opening should be checked routinely on recall dental visits by your general dentist.  The patient is advised to report any changes, soreness, or difficulties encountered when doing the exercises.

## 2020-10-10 ENCOUNTER — Encounter: Payer: Medicare Other | Admitting: Physical Therapy

## 2020-10-13 ENCOUNTER — Ambulatory Visit: Payer: Medicare Other

## 2020-10-13 DIAGNOSIS — C01 Malignant neoplasm of base of tongue: Secondary | ICD-10-CM | POA: Diagnosis not present

## 2020-10-19 ENCOUNTER — Other Ambulatory Visit: Payer: Self-pay

## 2020-10-19 ENCOUNTER — Inpatient Hospital Stay (HOSPITAL_BASED_OUTPATIENT_CLINIC_OR_DEPARTMENT_OTHER): Payer: Medicare Other | Admitting: Hematology and Oncology

## 2020-10-19 ENCOUNTER — Inpatient Hospital Stay: Payer: Medicare Other | Attending: Hematology and Oncology

## 2020-10-19 ENCOUNTER — Other Ambulatory Visit: Payer: Self-pay | Admitting: Hematology and Oncology

## 2020-10-19 ENCOUNTER — Encounter: Payer: Self-pay | Admitting: Hematology and Oncology

## 2020-10-19 ENCOUNTER — Inpatient Hospital Stay: Payer: Medicare Other

## 2020-10-19 VITALS — BP 108/76 | HR 68 | Temp 98.4°F | Resp 18 | Ht 72.0 in | Wt 159.2 lb

## 2020-10-19 DIAGNOSIS — Z8249 Family history of ischemic heart disease and other diseases of the circulatory system: Secondary | ICD-10-CM | POA: Insufficient documentation

## 2020-10-19 DIAGNOSIS — Z95828 Presence of other vascular implants and grafts: Secondary | ICD-10-CM

## 2020-10-19 DIAGNOSIS — Z923 Personal history of irradiation: Secondary | ICD-10-CM | POA: Diagnosis not present

## 2020-10-19 DIAGNOSIS — Z79899 Other long term (current) drug therapy: Secondary | ICD-10-CM | POA: Insufficient documentation

## 2020-10-19 DIAGNOSIS — I480 Paroxysmal atrial fibrillation: Secondary | ICD-10-CM | POA: Diagnosis not present

## 2020-10-19 DIAGNOSIS — C01 Malignant neoplasm of base of tongue: Secondary | ICD-10-CM

## 2020-10-19 DIAGNOSIS — R21 Rash and other nonspecific skin eruption: Secondary | ICD-10-CM | POA: Diagnosis not present

## 2020-10-19 DIAGNOSIS — Z9221 Personal history of antineoplastic chemotherapy: Secondary | ICD-10-CM | POA: Diagnosis not present

## 2020-10-19 LAB — CBC WITH DIFFERENTIAL (CANCER CENTER ONLY)
Abs Immature Granulocytes: 0.03 10*3/uL (ref 0.00–0.07)
Basophils Absolute: 0 10*3/uL (ref 0.0–0.1)
Basophils Relative: 0 %
Eosinophils Absolute: 0 10*3/uL (ref 0.0–0.5)
Eosinophils Relative: 1 %
HCT: 33.9 % — ABNORMAL LOW (ref 39.0–52.0)
Hemoglobin: 11.9 g/dL — ABNORMAL LOW (ref 13.0–17.0)
Immature Granulocytes: 1 %
Lymphocytes Relative: 26 %
Lymphs Abs: 1.1 10*3/uL (ref 0.7–4.0)
MCH: 34.8 pg — ABNORMAL HIGH (ref 26.0–34.0)
MCHC: 35.1 g/dL (ref 30.0–36.0)
MCV: 99.1 fL (ref 80.0–100.0)
Monocytes Absolute: 0.6 10*3/uL (ref 0.1–1.0)
Monocytes Relative: 14 %
Neutro Abs: 2.4 10*3/uL (ref 1.7–7.7)
Neutrophils Relative %: 58 %
Platelet Count: 163 10*3/uL (ref 150–400)
RBC: 3.42 MIL/uL — ABNORMAL LOW (ref 4.22–5.81)
RDW: 12.1 % (ref 11.5–15.5)
WBC Count: 4.1 10*3/uL (ref 4.0–10.5)
nRBC: 0 % (ref 0.0–0.2)

## 2020-10-19 LAB — CMP (CANCER CENTER ONLY)
ALT: 13 U/L (ref 0–44)
AST: 16 U/L (ref 15–41)
Albumin: 3.6 g/dL (ref 3.5–5.0)
Alkaline Phosphatase: 68 U/L (ref 38–126)
Anion gap: 10 (ref 5–15)
BUN: 17 mg/dL (ref 8–23)
CO2: 25 mmol/L (ref 22–32)
Calcium: 9.1 mg/dL (ref 8.9–10.3)
Chloride: 97 mmol/L — ABNORMAL LOW (ref 98–111)
Creatinine: 0.71 mg/dL (ref 0.61–1.24)
GFR, Estimated: >60 mL/min (ref >60)
Glucose, Bld: 82 mg/dL (ref 70–99)
Potassium: 4.2 mmol/L (ref 3.5–5.1)
Sodium: 132 mmol/L — ABNORMAL LOW (ref 135–145)
Total Bilirubin: 0.4 mg/dL (ref 0.3–1.2)
Total Protein: 6.1 g/dL — ABNORMAL LOW (ref 6.5–8.1)

## 2020-10-19 MED ORDER — SODIUM CHLORIDE 0.9% FLUSH
10.0000 mL | Freq: Once | INTRAVENOUS | Status: AC
  Administered 2020-10-19: 10 mL
  Filled 2020-10-19: qty 10

## 2020-10-19 MED ORDER — HEPARIN SOD (PORK) LOCK FLUSH 100 UNIT/ML IV SOLN
500.0000 [IU] | Freq: Once | INTRAVENOUS | Status: AC
  Administered 2020-10-19: 500 [IU]
  Filled 2020-10-19: qty 5

## 2020-10-19 NOTE — Progress Notes (Signed)
[place] [phone]   [phone]  PROGRESS NOTE  Patient Care Team: Wenda Low, MD as PCP - General (Internal Medicine) Malmfelt, Stephani Police, RN as Oncology Nurse Navigator Eppie Gibson, MD as Consulting Physician (Radiation Oncology) Orson Slick, MD as Consulting Physician (Hematology and Oncology) Sharen Counter, CCC-SLP as Speech Language Pathologist (Speech Pathology) Wynelle Beckmann, Melodie Bouillon, PT as Physical Therapist (Physical Therapy) Beverely Pace, LCSW as Social Worker (General Practice) Karie Mainland, RD as Dietitian (Nutrition) Izora Gala, MD as Consulting Physician (Otolaryngology)  Hematological/Oncological History 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 Mass biopsied at the right base of the tongue. Biopsy showed invasive moderately differentiated squamous cell carcinoma 3) 06/06/2020: PET CT scan showed hypermetabolic right tongue base mass consistent with known neoplasm. Right level 2 and level 3 metastatic adenopathy. No evidence of metastatic disease.  4) 06/09/2020: established care with Dr. Isidore Moos 5) 06/12/2020: establish care with Dr. Lorenso Courier  6) 07/03/2020: Week 1 of Cisplatin chemoradiation 7) 07/10/2020: Week 2 of Cisplatin chemoradiation 8) 07/17/2020: Week 3 of Cisplatin chemoradiation 9) 07/24/2020: Week 4 of Cisplatin chemoradiation 10) 07/31/2020: Week 5 of Cisplatin chemoradiation 11) 08/07/2020: Week 6 of Cisplatin chemoradiation 12) 08/14/2020: Week 7 of Cisplatin chemoradiation 13) 08/18/2020: end of radiation therapy.   Interval History:  Jasaan Thigpen 70 y.o. male with medical history significant for SCC of the base of the tongue who presents for a  follow up visit. The patient's last visit was on 09/15/2020. In the interim since the last visit he has had no major changes in his health.  On exam today Mr. Kemmerling ports that he is still struggling with p.o. intake.  He notes that his throat hurts when he tries to eat and the cold also tends to bother it.  He does that he is taking about 40% of his nutrition by mouth and 60% via the tube.  He does not there are particular foods that tend to flare it more than others.  Fortunately he has not been having any issues with fevers, chills, sweats, nausea, vomiting or diarrhea.  He notes that he does feel discouraged but he continues to try to eat.  His weight has increased by approximately 6 pounds since his last visit in December.  A full 10 point ROS is listed below.  MEDICAL HISTORY:  Past Medical History:  Diagnosis Date  . Hyperlipidemia   . Paroxysmal atrial fibrillation ([place])     SURGICAL HISTORY: Past Surgical History:  Procedure Laterality Date  . DIRECT LARYNGOSCOPY N/A 05/22/2020   Procedure: DIRECT LARYNGOSCOPY w/BIOPSY TONGUE BASE;  Surgeon: Izora Gala, MD;  Location: [place];  Service: ENT;  Laterality: N/A;  . IR GASTROSTOMY TUBE MOD SED  06/26/2020  . IR IMAGING GUIDED PORT INSERTION  06/26/2020  . TOTAL HIP ARTHROPLASTY  2012   right hip-Dr. Berenice Primas    SOCIAL HISTORY: Social History   Socioeconomic History  . Marital status: Significant Other    Spouse name: Not on file  . Number of children: 1  . Years of education: Not on file  . Highest education level: Not on file  Occupational History  . Not on file  Tobacco  Use  . Smoking status: Never Smoker  . Smokeless tobacco: Never Used  Vaping Use  . Vaping Use: Never used  Substance and Sexual Activity  . Alcohol use: Yes  . Drug use: No  . Sexual activity: Yes  Other Topics Concern  . Not on file  Social History Narrative  . Not on file   Social Determinants of Health   Financial Resource  Strain: Low Risk   . Difficulty of Paying Living Expenses: Not hard at all  Food Insecurity: No Food Insecurity  . Worried About Charity fundraiser in the Last Year: Never true  . Ran Out of Food in the Last Year: Never true  Transportation Needs: No Transportation Needs  . Lack of Transportation (Medical): No  . Lack of Transportation (Non-Medical): No  Physical Activity: Not on file  Stress: Not on file  Social Connections: Moderately Isolated  . Frequency of Communication with Friends and Family: More than three times a week  . Frequency of Social Gatherings with Friends and Family: More than three times a week  . Attends Religious Services: Never  . Active Member of Clubs or Organizations: No  . Attends Archivist Meetings: Not on file  . Marital Status: Married  Human resources officer Violence: Not on file    FAMILY HISTORY: Family History  Problem Relation Age of Onset  . Hypertension Mother   . Heart disease Mother   . Heart disease Brother     ALLERGIES:  has No Known Allergies.  MEDICATIONS:  Current Outpatient Medications  Medication Sig Dispense Refill  . amoxicillin (AMOXIL) 500 MG capsule Take four capsules one hour before dental appointment. 4 capsule 3  . aspirin 81 MG chewable tablet Chew by mouth daily.     Marland Kitchen atorvastatin (LIPITOR) 10 MG tablet Take 10 mg by mouth daily.    . Cholecalciferol (VITAMIN D3 SUPER STRENGTH) 50 MCG (2000 UT) TABS Take 2,000 Units by mouth.    . cyanocobalamin 1000 MCG tablet Take 1,000 mcg by mouth daily.    . folic acid (FOLVITE) A999333 MCG tablet Take 400 mcg by mouth daily.    Marland Kitchen HYDROcodone-acetaminophen (NORCO/VICODIN) 5-325 MG tablet Take 1 tablet by mouth every 6 (six) hours as needed. (Patient not taking: Reported on 10/19/2020)    . lidocaine (XYLOCAINE) 2 % solution Patient: Mix 1part 2% viscous lidocaine, 1part H20. Swish & swallow 63mL of diluted mixture, 72min before meals and at bedtime, up to QID (Patient not taking:  Reported on 10/19/2020) 200 mL 5  . lidocaine-prilocaine (EMLA) cream Apply 1 application topically as needed. 30 g 0  . LORazepam (ATIVAN) 0.5 MG tablet Take 1 tab 30 min before radiotherapy, prn anxiety.  Will need a driver if taking this medication. 20 tablet 0  . melatonin 5 MG TABS Take 5 mg by mouth at bedtime.    . Misc Natural Products (CHROMIUM PICOLINATE FORTIFIED PO) Take by mouth.    . Nutritional Supplements (ANTI-INFLAMMATORY ENZYME) CAPS Take by mouth. Recover AI    . Nutritional Supplements (KATE FARMS STANDARD 1.4) LIQD 1 Container by Enteral route 5 (five) times daily. 1625 mL 8  . ondansetron (ZOFRAN) 8 MG tablet Take 1 tablet (8 mg total) by mouth every 8 (eight) hours as needed for nausea or vomiting. 20 tablet 0  . oxyCODONE (ROXICODONE) 5 MG/5ML solution Take 5 mLs (5 mg total) by mouth every 6 (six) hours as needed for severe pain. (Patient not taking: Reported on 10/19/2020) 200  mL 0  . prochlorperazine (COMPAZINE) 10 MG tablet Take 1 tablet (10 mg total) by mouth every 6 (six) hours as needed for nausea or vomiting. (Patient not taking: Reported on 10/19/2020) 30 tablet 0  . Sennosides (SENNA) 8.8 MG/5ML LIQD Take 10 mLs by mouth 2 (two) times daily as needed. 105 mL 1  . sodium fluoride (PREVIDENT 5000 PLUS) 1.1 % CREA dental cream Apply to tooth brush. Brush teeth for 2 minutes. Spit out excess-DO NOT swallow. Repeat nightly. 102 g prn   No current facility-administered medications for this visit.    REVIEW OF SYSTEMS:   Constitutional: ( - ) fevers, ( - )  chills , ( - ) night sweats Eyes: ( - ) blurriness of vision, ( - ) double vision, ( - ) watery eyes Ears, nose, mouth, throat, and face: ( - ) mucositis, ( - ) sore throat Respiratory: ( - ) cough, ( - ) dyspnea, ( - ) wheezes Cardiovascular: ( - ) palpitation, ( - ) chest discomfort, ( - ) lower extremity swelling Gastrointestinal:  ( - ) nausea, ( - ) heartburn, ( - ) change in bowel habits Skin: ( - ) abnormal  skin rashes Lymphatics: ( - ) new lymphadenopathy, ( - ) easy bruising Neurological: ( - ) numbness, ( - ) tingling, ( - ) new weaknesses Behavioral/Psych: ( - ) mood change, ( - ) new changes  All other systems were reviewed with the patient and are negative.  PHYSICAL EXAMINATION: ECOG PERFORMANCE STATUS: 0 - Asymptomatic  Vitals:   10/19/20 1531  BP: 108/76  Pulse: 68  Resp: 18  Temp: 98.4 F (36.9 C)  SpO2: 99%   Filed Weights   10/19/20 1531  Weight: 159 lb 3.2 oz (72.2 kg)    GENERAL: well appearing elderly Caucasian male alert, no distress and comfortable SKIN:  Worsening erythema/skin drying around neck.  EYES: conjunctiva are pink and non-injected, sclera clear OROPHARYNX: Erythema of the right back of throat. Thick saliva. No evidence of ulcer/infection.  LUNGS: clear to auscultation and percussion with normal breathing effort HEART: regular rate & rhythm and no murmurs and no lower extremity edema Musculoskeletal: no cyanosis of digits and no clubbing  PSYCH: alert & oriented x 3, fluent speech NEURO: no focal motor/sensory deficits  LABORATORY DATA:  I have reviewed the data as listed CBC Latest Ref Rng & Units 10/19/2020 09/15/2020 09/01/2020  WBC 4.0 - 10.5 K/uL 4.1 5.4 3.1(L)  Hemoglobin 13.0 - 17.0 g/dL 11.9(L) 11.5(L) 9.3(L)  Hematocrit 39.0 - 52.0 % 33.9(L) 32.9(L) 26.8(L)  Platelets 150 - 400 K/uL 163 229 174    CMP Latest Ref Rng & Units 10/19/2020 09/15/2020 09/01/2020  Glucose 70 - 99 mg/dL 82 100(H) 89  BUN 8 - 23 mg/dL 17 19 17   Creatinine 0.61 - 1.24 mg/dL 0.71 0.78 0.62  Sodium 135 - 145 mmol/L 132(L) 130(L) 132(L)  Potassium 3.5 - 5.1 mmol/L 4.2 4.5 4.2  Chloride 98 - 111 mmol/L 97(L) 95(L) 100  CO2 22 - 32 mmol/L 25 28 24   Calcium 8.9 - 10.3 mg/dL 9.1 9.4 8.6(L)  Total Protein 6.5 - 8.1 g/dL 6.1(L) 6.1(L) 5.2(L)  Total Bilirubin 0.3 - 1.2 mg/dL 0.4 0.4 0.3  Alkaline Phos 38 - 126 U/L 68 78 79  AST 15 - 41 U/L 16 16 16   ALT 0 - 44 U/L 13  14 8     RADIOGRAPHIC STUDIES: No results found.  ASSESSMENT & PLAN Aaditya Letizia 70 y.o. male  with medical history significant for SCC of the base of the tongue who presents for a follow up visit.  Review the labs, review the records, discussion with the patient the findings are most consistent with a squamous cell carcinoma of the back base of the tongue.  This was a T1 N1 M0 p16 positive cancer which makes it stage I.  Overall the prognosis for this is quite well and there is an excellent chance of durable remission after chemoradiation therapy with cisplatin.   On exam today Mr. Prinsen is taking in 60% of his nutrition via G-tube (with 40% by mouth).  He is not having any fevers, chills, sweats.  The rash around his neck has nearly resolved.  His weight is improving but he is continue to have pain on swallowing.  Overall he appears improved from prior.   Previously we discussed treatment moving forward and the expected side effects and symptoms.  Side effects include but are not limited to fatigue, anemia, kidney dysfunction, nausea, vomiting, diarrhea, throat pain, weight loss, and neuropathy.  Patient his wife were agreeable to proceeding with treatment.  We also discussed the medications to help with potential side effects including Zofran as needed, Compazine as needed, and EMLA cream.    The treatment of choice for this patient will be radiation with concurrent cisplatin 40 mg per metered squared weekly x7 doses.  We started this on 07/03/2020 and had weekly visits with the patient while he is receiving this treatment. He completed chemotherapy on 08/14/2020.   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 P16+, Stage I  --patient completed 7 weekly doses of cisplatin 40mg /m2 administered with radiation. --supportive care as noted below. --at each visit check CMP, CBC and periodic TSH  --plan to see patient in 3 weeks time. Recommend evaluation by ENT or Rad/Onc for  fibro-optic exam due to pain. Reached out to navigator with this request.   #Pain Control --prescribed liquid roxicet 77ml q 6H PRN. Not using.   #Supportive Care --patient provided with zofran 8mg  q8H PRN and compazine 10mg  q6H PRN.  --EMLA cream for port provided --PEG and Port in place. Appreciate assistance from dietary regarding tube feeds --pain control as above --erythematous radiation rash improving with topical treatments.   No orders of the defined types were placed in this encounter.  All questions were answered. The patient knows to call the clinic with any problems, questions or concerns.  A total of more than 30 minutes were spent on this encounter and over half of that time was spent on counseling and coordination of care as outlined above.   Ledell Peoples, MD Department of Hematology/Oncology [place] [facility] Phone: [phone] Pager: [phone] Email: [email or web address]  10/19/2020 4:37 PM

## 2020-10-20 LAB — TSH: TSH: 1.831 u[IU]/mL (ref 0.320–4.118)

## 2020-10-23 ENCOUNTER — Telehealth: Payer: Self-pay | Admitting: Hematology and Oncology

## 2020-10-23 NOTE — Telephone Encounter (Signed)
Left message with upcoming appointment. Gave option to call back to reschedule if needed. 

## 2020-10-31 ENCOUNTER — Other Ambulatory Visit: Payer: Self-pay | Admitting: Orthopedic Surgery

## 2020-10-31 DIAGNOSIS — M545 Low back pain, unspecified: Secondary | ICD-10-CM

## 2020-11-01 ENCOUNTER — Telehealth: Payer: Self-pay | Admitting: Nutrition

## 2020-11-01 NOTE — Telephone Encounter (Signed)
Telephone follow-up completed with patient status post treatment for right tongue cancer. Last weight documented was 159.2 pounds on January 20 improved from 155 pounds January 10.  Patient reports things are improving.  Reports that his food intake and appetite have improved greatly.  He is able to consume smaller meals during the day. He has continued 2-3 cartons of [employer] 1.4 via PEG daily.  He verbalizes hope for getting his tube out soon.  Educated patient to continue small amounts of food often throughout the day.  Reviewed high-calorie high-protein foods and encouraged patient to consume.  Recommended patient continue 3 cartons [employer] 1.4 this week via PEG.  Suggested he decrease by 1 carton weekly while increasing oral intake and monitoring weight.  Recommend patient contact me with questions or concerns.  Patient appreciative.

## 2020-11-10 ENCOUNTER — Inpatient Hospital Stay: Payer: Medicare Other

## 2020-11-10 ENCOUNTER — Encounter: Payer: Self-pay | Admitting: Hematology and Oncology

## 2020-11-10 ENCOUNTER — Inpatient Hospital Stay: Payer: Medicare Other | Attending: Hematology and Oncology | Admitting: Hematology and Oncology

## 2020-11-10 ENCOUNTER — Other Ambulatory Visit: Payer: Self-pay

## 2020-11-10 VITALS — BP 160/71 | HR 56 | Temp 97.8°F | Resp 16 | Ht 72.0 in | Wt 160.4 lb

## 2020-11-10 DIAGNOSIS — Z95828 Presence of other vascular implants and grafts: Secondary | ICD-10-CM

## 2020-11-10 DIAGNOSIS — Z79899 Other long term (current) drug therapy: Secondary | ICD-10-CM | POA: Insufficient documentation

## 2020-11-10 DIAGNOSIS — C01 Malignant neoplasm of base of tongue: Secondary | ICD-10-CM | POA: Insufficient documentation

## 2020-11-10 DIAGNOSIS — Z923 Personal history of irradiation: Secondary | ICD-10-CM | POA: Insufficient documentation

## 2020-11-10 DIAGNOSIS — Z7982 Long term (current) use of aspirin: Secondary | ICD-10-CM | POA: Insufficient documentation

## 2020-11-10 DIAGNOSIS — E785 Hyperlipidemia, unspecified: Secondary | ICD-10-CM | POA: Diagnosis not present

## 2020-11-10 DIAGNOSIS — Z9221 Personal history of antineoplastic chemotherapy: Secondary | ICD-10-CM | POA: Diagnosis not present

## 2020-11-10 DIAGNOSIS — I480 Paroxysmal atrial fibrillation: Secondary | ICD-10-CM | POA: Insufficient documentation

## 2020-11-10 LAB — CMP (CANCER CENTER ONLY)
ALT: 22 U/L (ref 0–44)
AST: 18 U/L (ref 15–41)
Albumin: 3.3 g/dL — ABNORMAL LOW (ref 3.5–5.0)
Alkaline Phosphatase: 74 U/L (ref 38–126)
Anion gap: 6 (ref 5–15)
BUN: 12 mg/dL (ref 8–23)
CO2: 25 mmol/L (ref 22–32)
Calcium: 9 mg/dL (ref 8.9–10.3)
Chloride: 102 mmol/L (ref 98–111)
Creatinine: 0.73 mg/dL (ref 0.61–1.24)
GFR, Estimated: >60 mL/min (ref >60)
Glucose, Bld: 139 mg/dL — ABNORMAL HIGH (ref 70–99)
Potassium: 4.5 mmol/L (ref 3.5–5.1)
Sodium: 133 mmol/L — ABNORMAL LOW (ref 135–145)
Total Bilirubin: 0.4 mg/dL (ref 0.3–1.2)
Total Protein: 6 g/dL — ABNORMAL LOW (ref 6.5–8.1)

## 2020-11-10 LAB — CBC WITH DIFFERENTIAL (CANCER CENTER ONLY)
Abs Immature Granulocytes: 0.02 10*3/uL (ref 0.00–0.07)
Basophils Absolute: 0 10*3/uL (ref 0.0–0.1)
Basophils Relative: 1 %
Eosinophils Absolute: 0.1 10*3/uL (ref 0.0–0.5)
Eosinophils Relative: 3 %
HCT: 33.8 % — ABNORMAL LOW (ref 39.0–52.0)
Hemoglobin: 11.9 g/dL — ABNORMAL LOW (ref 13.0–17.0)
Immature Granulocytes: 1 %
Lymphocytes Relative: 15 %
Lymphs Abs: 0.6 10*3/uL — ABNORMAL LOW (ref 0.7–4.0)
MCH: 34.7 pg — ABNORMAL HIGH (ref 26.0–34.0)
MCHC: 35.2 g/dL (ref 30.0–36.0)
MCV: 98.5 fL (ref 80.0–100.0)
Monocytes Absolute: 0.5 10*3/uL (ref 0.1–1.0)
Monocytes Relative: 12 %
Neutro Abs: 2.8 10*3/uL (ref 1.7–7.7)
Neutrophils Relative %: 68 %
Platelet Count: 160 10*3/uL (ref 150–400)
RBC: 3.43 MIL/uL — ABNORMAL LOW (ref 4.22–5.81)
RDW: 11.8 % (ref 11.5–15.5)
WBC Count: 4.1 10*3/uL (ref 4.0–10.5)
nRBC: 0 % (ref 0.0–0.2)

## 2020-11-10 MED ORDER — SODIUM CHLORIDE 0.9% FLUSH
10.0000 mL | Freq: Once | INTRAVENOUS | Status: AC
  Administered 2020-11-10: 10 mL
  Filled 2020-11-10: qty 10

## 2020-11-10 MED ORDER — HEPARIN SOD (PORK) LOCK FLUSH 100 UNIT/ML IV SOLN
500.0000 [IU] | Freq: Once | INTRAVENOUS | Status: AC
  Administered 2020-11-10: 500 [IU]
  Filled 2020-11-10: qty 5

## 2020-11-10 NOTE — Progress Notes (Signed)
[place] [phone]   [phone]  PROGRESS NOTE  Patient Care Team: Wenda Low, MD as PCP - General (Internal Medicine) Malmfelt, Stephani Police, RN as Oncology Nurse Navigator Eppie Gibson, MD as Consulting Physician (Radiation Oncology) Orson Slick, MD as Consulting Physician (Hematology and Oncology) Sharen Counter, CCC-SLP as Speech Language Pathologist (Speech Pathology) Wynelle Beckmann, Melodie Bouillon, PT as Physical Therapist (Physical Therapy) Beverely Pace, LCSW as Social Worker (General Practice) Karie Mainland, RD as Dietitian (Nutrition) Izora Gala, MD as Consulting Physician (Otolaryngology)  Hematological/Oncological History 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 Mass biopsied at the right base of the tongue. Biopsy showed invasive moderately differentiated squamous cell carcinoma 3) 06/06/2020: PET CT scan showed hypermetabolic right tongue base mass consistent with known neoplasm. Right level 2 and level 3 metastatic adenopathy. No evidence of metastatic disease.  4) 06/09/2020: established care with Dr. Isidore Nelson 5) 06/12/2020: establish care with Dr. Lorenso Courier  6) 07/03/2020: Week 1 of Cisplatin chemoradiation 7) 07/10/2020: Week 2 of Cisplatin chemoradiation 8) 07/17/2020: Week 3 of Cisplatin chemoradiation 9) 07/24/2020: Week 4 of Cisplatin chemoradiation 10) 07/31/2020: Week 5 of Cisplatin chemoradiation 11) 08/07/2020: Week 6 of Cisplatin chemoradiation 12) 08/14/2020: Week 7 of Cisplatin chemoradiation 13) 08/18/2020: end of radiation therapy.   Interval History:  Patrick Nelson 70 y.o. male with medical history significant for SCC of the base of the tongue who presents for a  follow up visit. The patient's last visit was on 10/19/2020. In the interim since the last visit he has had no major changes in his health.  On exam today Patrick Nelson is a he has been improving in the interim since her last visit.  He has been eating more and reports that he is eating eggs and bacon and oatmeal for breakfast and has actually been able to eat some chicken breast as well.  He notes that the only thing he has not been able to tolerate has been predominantly spicy food.  He notes he rarely has some discomfort in his mouth and he does still struggle with some dry mouth.  He is down to 1-2 tube feeds per day.  He is also not been having any issues with nausea, vomiting, or diarrhea.  His bowels have been moving regularly.  A full 10 point ROS is listed below.  MEDICAL HISTORY:  Past Medical History:  Diagnosis Date  . Hyperlipidemia   . Paroxysmal atrial fibrillation ([place])     SURGICAL HISTORY: Past Surgical History:  Procedure Laterality Date  . DIRECT LARYNGOSCOPY N/A 05/22/2020   Procedure: DIRECT LARYNGOSCOPY w/BIOPSY TONGUE BASE;  Surgeon: Izora Gala, MD;  Location: [facility];  Service: ENT;  Laterality: N/A;  . IR GASTROSTOMY TUBE MOD SED  06/26/2020  . IR IMAGING GUIDED PORT INSERTION  06/26/2020  . TOTAL HIP ARTHROPLASTY  2012   right hip-Dr. Berenice Primas    SOCIAL HISTORY: Social History   Socioeconomic History  . Marital status: Significant Other    Spouse name: Not on file  . Number of children: 1  . Years of education: Not on file  . Highest education level: Not on file  Occupational History  . Not on  file  Tobacco Use  . Smoking status: Never Smoker  . Smokeless tobacco: Never Used  Vaping Use  . Vaping Use: Never used  Substance and Sexual Activity  . Alcohol use: Yes  . Drug use: No  . Sexual activity: Yes  Other Topics Concern  . Not on file  Social History Narrative  . Not on file   Social Determinants of Health   Financial Resource  Strain: Low Risk   . Difficulty of Paying Living Expenses: Not hard at all  Food Insecurity: No Food Insecurity  . Worried About Charity fundraiser in the Last Year: Never true  . Ran Out of Food in the Last Year: Never true  Transportation Needs: No Transportation Needs  . Lack of Transportation (Medical): No  . Lack of Transportation (Non-Medical): No  Physical Activity: Not on file  Stress: Not on file  Social Connections: Moderately Isolated  . Frequency of Communication with Friends and Family: More than three times a week  . Frequency of Social Gatherings with Friends and Family: More than three times a week  . Attends Religious Services: Never  . Active Member of Clubs or Organizations: No  . Attends Archivist Meetings: Not on file  . Marital Status: Married  Human resources officer Violence: Not on file    FAMILY HISTORY: Family History  Problem Relation Age of Onset  . Hypertension Mother   . Heart disease Mother   . Heart disease Brother     ALLERGIES:  has No Known Allergies.  MEDICATIONS:  Current Outpatient Medications  Medication Sig Dispense Refill  . amoxicillin (AMOXIL) 500 MG capsule Take four capsules one hour before dental appointment. 4 capsule 3  . aspirin 81 MG chewable tablet Chew by mouth daily.     Marland Kitchen atorvastatin (LIPITOR) 10 MG tablet Take 10 mg by mouth daily.    . Cholecalciferol (VITAMIN D3 SUPER STRENGTH) 50 MCG (2000 UT) TABS Take 2,000 Units by mouth.    . cyanocobalamin 1000 MCG tablet Take 1,000 mcg by mouth daily.    . folic acid (FOLVITE) 170 MCG tablet Take 400 mcg by mouth daily.    Marland Kitchen HYDROcodone-acetaminophen (NORCO/VICODIN) 5-325 MG tablet Take 1 tablet by mouth every 6 (six) hours as needed. (Patient not taking: Reported on 10/19/2020)    . lidocaine (XYLOCAINE) 2 % solution Patient: Mix 1part 2% viscous lidocaine, 1part H20. Swish & swallow 90mL of diluted mixture, 58min before meals and at bedtime, up to QID (Patient not taking:  Reported on 10/19/2020) 200 mL 5  . lidocaine-prilocaine (EMLA) cream Apply 1 application topically as needed. 30 g 0  . LORazepam (ATIVAN) 0.5 MG tablet Take 1 tab 30 min before radiotherapy, prn anxiety.  Will need a driver if taking this medication. 20 tablet 0  . melatonin 5 MG TABS Take 5 mg by mouth at bedtime.    . Misc Natural Products (CHROMIUM PICOLINATE FORTIFIED PO) Take by mouth.    . Nutritional Supplements (ANTI-INFLAMMATORY ENZYME) CAPS Take by mouth. Recover AI    . Nutritional Supplements (KATE FARMS STANDARD 1.4) LIQD 1 Container by Enteral route 5 (five) times daily. 1625 mL 8  . ondansetron (ZOFRAN) 8 MG tablet Take 1 tablet (8 mg total) by mouth every 8 (eight) hours as needed for nausea or vomiting. (Patient not taking: Reported on 11/10/2020) 20 tablet 0  . oxyCODONE (ROXICODONE) 5 MG/5ML solution Take 5 mLs (5 mg total) by mouth every 6 (six) hours as needed for  severe pain. (Patient not taking: Reported on 10/19/2020) 200 mL 0  . prochlorperazine (COMPAZINE) 10 MG tablet Take 1 tablet (10 mg total) by mouth every 6 (six) hours as needed for nausea or vomiting. (Patient not taking: Reported on 10/19/2020) 30 tablet 0  . Sennosides (SENNA) 8.8 MG/5ML LIQD Take 10 mLs by mouth 2 (two) times daily as needed. 105 mL 1  . sodium fluoride (PREVIDENT 5000 PLUS) 1.1 % CREA dental cream Apply to tooth brush. Brush teeth for 2 minutes. Spit out excess-DO NOT swallow. Repeat nightly. 102 g prn   No current facility-administered medications for this visit.    REVIEW OF SYSTEMS:   Constitutional: ( - ) fevers, ( - )  chills , ( - ) night sweats Eyes: ( - ) blurriness of vision, ( - ) double vision, ( - ) watery eyes Ears, nose, mouth, throat, and face: ( - ) mucositis, ( - ) sore throat Respiratory: ( - ) cough, ( - ) dyspnea, ( - ) wheezes Cardiovascular: ( - ) palpitation, ( - ) chest discomfort, ( - ) lower extremity swelling Gastrointestinal:  ( - ) nausea, ( - ) heartburn, ( - )  change in bowel habits Skin: ( - ) abnormal skin rashes Lymphatics: ( - ) new lymphadenopathy, ( - ) easy bruising Neurological: ( - ) numbness, ( - ) tingling, ( - ) new weaknesses Behavioral/Psych: ( - ) mood change, ( - ) new changes  All other systems were reviewed with the patient and are negative.  PHYSICAL EXAMINATION: ECOG PERFORMANCE STATUS: 0 - Asymptomatic  Vitals:   11/10/20 1029  BP: (!) 160/71  Pulse: (!) 56  Resp: 16  Temp: 97.8 F (36.6 C)  SpO2: 100%   Filed Weights   11/10/20 1029  Weight: 160 lb 6.4 oz (72.8 kg)    GENERAL: well appearing elderly Caucasian male alert, no distress and comfortable SKIN:  Normal tugor, no more erythema around neck. No skin lesions noted. EYES: conjunctiva are pink and non-injected, sclera clear OROPHARYNX: considerably less erythema of the right back of throat. Thick saliva. No evidence of ulcer/infection.  LUNGS: clear to auscultation and percussion with normal breathing effort HEART: regular rate & rhythm and no murmurs and no lower extremity edema Musculoskeletal: no cyanosis of digits and no clubbing  PSYCH: alert & oriented x 3, fluent speech NEURO: no focal motor/sensory deficits  LABORATORY DATA:  I have reviewed the data as listed CBC Latest Ref Rng & Units 11/10/2020 10/19/2020 09/15/2020  WBC 4.0 - 10.5 K/uL 4.1 4.1 5.4  Hemoglobin 13.0 - 17.0 g/dL 11.9(L) 11.9(L) 11.5(L)  Hematocrit 39.0 - 52.0 % 33.8(L) 33.9(L) 32.9(L)  Platelets 150 - 400 K/uL 160 163 229    CMP Latest Ref Rng & Units 11/10/2020 10/19/2020 09/15/2020  Glucose 70 - 99 mg/dL 139(H) 82 100(H)  BUN 8 - 23 mg/dL 12 17 19   Creatinine 0.61 - 1.24 mg/dL 0.73 0.71 0.78  Sodium 135 - 145 mmol/L 133(L) 132(L) 130(L)  Potassium 3.5 - 5.1 mmol/L 4.5 4.2 4.5  Chloride 98 - 111 mmol/L 102 97(L) 95(L)  CO2 22 - 32 mmol/L 25 25 28   Calcium 8.9 - 10.3 mg/dL 9.0 9.1 9.4  Total Protein 6.5 - 8.1 g/dL 6.0(L) 6.1(L) 6.1(L)  Total Bilirubin 0.3 - 1.2 mg/dL 0.4  0.4 0.4  Alkaline Phos 38 - 126 U/L 74 68 78  AST 15 - 41 U/L 18 16 16   ALT 0 - 44 U/L 22 13  14    RADIOGRAPHIC STUDIES: No results found.  ASSESSMENT & PLAN Patrick Nelson 70 y.o. male with medical history significant for SCC of the base of the tongue who presents for a follow up visit.  Review the labs, review the records, discussion with the patient the findings are most consistent with a squamous cell carcinoma of the back base of the tongue.  This was a T1 N1 M0 p16 positive cancer which makes it stage I.  Overall the prognosis for this is quite well and there is an excellent chance of durable remission after chemoradiation therapy with cisplatin.   On exam today Patrick Nelson is steadily improving with more PO intake. He still receives 1-2 tube feeds a day.  Overall he appears improved from prior.   Previously we discussed treatment moving forward and the expected side effects and symptoms.  Side effects include but are not limited to fatigue, anemia, kidney dysfunction, nausea, vomiting, diarrhea, throat pain, weight loss, and neuropathy.  Patient his wife were agreeable to proceeding with treatment.  We also discussed the medications to help with potential side effects including Zofran as needed, Compazine as needed, and EMLA cream.    The treatment of choice for this patient will be radiation with concurrent cisplatin 40 mg per metered squared weekly x7 doses.  We started this on 07/03/2020 and had weekly visits with the patient while he is receiving this treatment. He completed chemotherapy on 08/14/2020.   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 P16+, Stage I  --patient completed 7 weekly doses of cisplatin 40mg /m2 administered with radiation on 08/18/2020 --supportive care as noted below. --at each visit check CMP, CBC and periodic TSH  --plan to see patient in 4 weeks time. In the interim he will have his PET CT scan and a virtual visit with Dr. Isidore Nelson.    #Pain Control --prescribed liquid roxicet 17ml q 6H PRN. Not using. No longer required.   #Supportive Care --patient provided with zofran 8mg  q8H PRN and compazine 10mg  q6H PRN.  --EMLA cream for port provided --PEG and Port in place. Appreciate assistance from dietary regarding tube feeds --pain control as above --erythematous radiation rash resolved with topical treatments.   No orders of the defined types were placed in this encounter.  All questions were answered. The patient knows to call the clinic with any problems, questions or concerns.  A total of more than 30 minutes were spent on this encounter and over half of that time was spent on counseling and coordination of care as outlined above.   Ledell Peoples, MD Department of Hematology/Oncology [place] [facility] [place] Phone: [phone] Pager: [phone] Email: [email or web address]  11/14/2020 8:06 PM

## 2020-11-13 ENCOUNTER — Telehealth: Payer: Self-pay | Admitting: Hematology and Oncology

## 2020-11-13 NOTE — Telephone Encounter (Signed)
Scheduled appts per 2/11 los. Left voicemail with appt date and time.

## 2020-11-14 ENCOUNTER — Encounter: Payer: Self-pay | Admitting: Hematology and Oncology

## 2020-11-15 ENCOUNTER — Other Ambulatory Visit: Payer: Self-pay

## 2020-11-15 ENCOUNTER — Ambulatory Visit: Payer: Medicare Other | Attending: Radiation Oncology

## 2020-11-15 DIAGNOSIS — R1310 Dysphagia, unspecified: Secondary | ICD-10-CM | POA: Insufficient documentation

## 2020-11-15 NOTE — Therapy (Signed)
[facility] [address] [place], [place], [address] Phone: [phone]   Fax:  [phone]  Speech Language Pathology Treatment/Renewal Summary  Patient Details  Name: Patrick Nelson MRN: [id number] Date of Birth: [birth year] Referring Provider (SLP): Eppie Gibson, MD   Encounter Date: 11/15/2020   End of Session - 11/15/20 1526    Visit Number 3    Number of Visits 7    Date for SLP Re-Evaluation 02/02/21   90 days   SLP Start Time 1018    SLP Stop Time  1050    SLP Time Calculation (min) 32 min    Activity Tolerance Patient tolerated treatment well           Past Medical History:  Diagnosis Date  . Hyperlipidemia   . Paroxysmal atrial fibrillation [facility])     Past Surgical History:  Procedure Laterality Date  . DIRECT LARYNGOSCOPY N/A 05/22/2020   Procedure: DIRECT LARYNGOSCOPY w/BIOPSY TONGUE BASE;  Surgeon: Izora Gala, MD;  Location: [facility];  Service: ENT;  Laterality: N/A;  . IR GASTROSTOMY TUBE MOD SED  06/26/2020  . IR IMAGING GUIDED PORT INSERTION  06/26/2020  . TOTAL HIP ARTHROPLASTY  2012   right hip-Dr. Berenice Primas    There were no vitals filed for this visit.   Subjective Assessment - 11/15/20 1024    Subjective "I'm eating three meals a day - staying away from spicy food though."    Currently in Pain? No/denies                 ADULT SLP TREATMENT - 11/15/20 1024      General Information   Behavior/Cognition Alert;Cooperative;Pleasant mood      Treatment Provided   Treatment provided Dysphagia      Dysphagia Treatment   Temperature Spikes Noted No    Respiratory Status Room air    Treatment Methods Therapeutic exercise;Compensation strategy training;Patient/caregiver education    Patient observed directly with PO's Yes    Type of PO's observed Regular;Thin liquids    Liquids provided via Cup    Oral Phase Signs & Symptoms --   none noted   Pharyngeal Phase  Signs & Symptoms --   none noted   Other treatment/comments When asked about pt's frequency of HEP - pt's completion remains less than prescribed- pt stated he thought now that his eating returned to more WNL the exercises are not as necessary. SLP reiterated that they cont to be necessary as prescribed adn provided rationale for this. SLP provided pt overt s/s aspiration PNA - pt told SLP 3 overt s/sx with modified independence. Pt agreed next session in 2 months due to progress with POs. Pt with most difficulty with Mendelsohn procedure and with initiation of swallow with MAsako.      Assessment / Recommendations / Plan   Plan Continue with current plan of care      Progression Toward Goals   Progression toward goals Not progressing toward goals (comment)   pt choice to perform HEP suboptimal frequency           SLP Education - 11/15/20 1526    Education Details HEP rationale, HEP procedure, overt s/sx aspiration PNA    Person(s) Educated Patient    Methods Explanation;Demonstration;Verbal cues;Handout    Comprehension Verbalized understanding;Returned demonstration;Verbal cues required;Need further instruction            SLP Short Term Goals - 09/13/20 0924      SLP SHORT TERM  GOAL #1   Title pt will complete HEP with rare min A    Period --   sessions, for all STGs   Status Not Met      SLP SHORT TERM GOAL #2   Title pt will tell SLP why pt is completing HEP with modified independence    Status Not Met      SLP SHORT TERM GOAL #3   Title pt will tell SLP how a food journal could hasten return to a more normalized diet    Status Achieved            SLP Long Term Goals - 11/15/20 1035      SLP LONG TERM GOAL #1   Title pt will complete HEP with modified independence over two visits    Time 2    Period --   sessions, for all LTGs   Status On-going      SLP LONG TERM GOAL #2   Title pt will describe 3 overt s/s aspiration PNA with modified independence    Time --     Status Achieved      SLP LONG TERM GOAL #3   Title pt will describe how to modify HEP over time, and the timeline associated with reduction in HEP frequency with modified independence over two sessions    Time 4    Status On-going            Plan - 11/15/20 1528    Clinical Impression Statement At this time pt swallowing is deemed WNL/WFL with regular food item (peanut butter crackers) and water.See "other comments" for more details aobut today's session. Pt cont to choose to complete HEP at lower than prescribed frequency. SLP reviewed pt's individualized HEP for dysphagia and pt completed each exercise on their own with eventually (pt req'd occasional min SLP cues for procedure of exercises). There are no overt s/s aspiration PNA reported by pt at this time. Data indicate that pt's swallow ability will likely decrease over the course of radiation therapy and could very well decline over time following conclusion of their radiation therapy due to muscle disuse atrophy and/or muscle fibrosis. Pt will cont to need to be seen by SLP in order to assess safety of PO intake, assess the need for recommending any objective swallow assessment, and ensuring pt correctly completes the individualized HEP.    Speech Therapy Frequency --   once every approx 4 weeks   Duration --   7 total sessions   Treatment/Interventions Aspiration precaution training;Pharyngeal strengthening exercises;Compensatory techniques;Diet toleration management by SLP;Trials of upgraded texture/liquids;Internal/external aids;Patient/family education;SLP instruction and feedback    Potential to Achieve Goals Good    SLP Home Exercise Plan provided    Consulted and Agree with Plan of Care Patient           Patient will benefit from skilled therapeutic intervention in order to improve the following deficits and impairments:   Dysphagia, unspecified type    Problem List Patient Active Problem List   Diagnosis Date Noted   . Port-A-Cath in place 07/17/2020  . Squamous cell carcinoma of base of tongue ([place]) 06/12/2020  . Changing skin lesion 01/07/2019  . Sebaceous cyst 01/07/2019  . Syncope 12/14/2014  . CAP (community acquired pneumonia) 12/14/2014  . Right lower lobe pneumonia 12/14/2014  . Parapneumonic effusion 12/14/2014  . Paroxysmal atrial fibrillation ([place]) 01/25/2014  . Hyperlipidemia 01/25/2014    Ambrose Wile ,[place], [place]  11/15/2020, 3:30 PM  [facility]  [place], [place], [address] Phone: [phone]   Fax:  [phone]   Name: Patrick Nelson MRN: [id number] Date of Birth: [birth year]

## 2020-11-15 NOTE — Patient Instructions (Signed)
Signs of Aspiration Pneumonia   . Chest pain/tightness . Fever (can be low grade) . Cough  o With foul-smelling phlegm (sputum) o With sputum containing pus or blood o With greenish sputum . Fatigue  . Shortness of breath  . Wheezing   **IF YOU HAVE THESE SIGNS, CONTACT YOUR DOCTOR OR GO TO THE EMERGENCY DEPARTMENT OR URGENT CARE AS SOON AS POSSIBLE**      

## 2020-11-22 ENCOUNTER — Other Ambulatory Visit: Payer: Self-pay

## 2020-11-22 ENCOUNTER — Ambulatory Visit
Admission: RE | Admit: 2020-11-22 | Discharge: 2020-11-22 | Disposition: A | Discharge status: Home / Self Care | Payer: Medicare Other | Source: Ambulatory Visit | Attending: Orthopedic Surgery | Admitting: Orthopedic Surgery

## 2020-11-22 DIAGNOSIS — M545 Low back pain, unspecified: Secondary | ICD-10-CM

## 2020-11-22 DIAGNOSIS — M48061 Spinal stenosis, lumbar region without neurogenic claudication: Secondary | ICD-10-CM | POA: Diagnosis not present

## 2020-11-24 ENCOUNTER — Other Ambulatory Visit: Payer: Self-pay

## 2020-11-24 ENCOUNTER — Ambulatory Visit (HOSPITAL_COMMUNITY)
Admission: RE | Admit: 2020-11-24 | Discharge: 2020-11-24 | Disposition: A | Discharge status: Home / Self Care | Payer: Medicare Other | Source: Ambulatory Visit | Attending: Radiation Oncology | Admitting: Radiation Oncology

## 2020-11-24 DIAGNOSIS — C760 Malignant neoplasm of head, face and neck: Secondary | ICD-10-CM | POA: Diagnosis not present

## 2020-11-24 DIAGNOSIS — C01 Malignant neoplasm of base of tongue: Secondary | ICD-10-CM | POA: Diagnosis not present

## 2020-11-24 LAB — GLUCOSE, CAPILLARY: Glucose-Capillary: 93 mg/dL (ref 70–99)

## 2020-11-24 MED ORDER — FLUDEOXYGLUCOSE F - 18 (FDG) INJECTION
8.3000 | Freq: Once | INTRAVENOUS | Status: AC | PRN
  Administered 2020-11-24: 8.05 via INTRAVENOUS

## 2020-11-28 ENCOUNTER — Ambulatory Visit
Admission: RE | Admit: 2020-11-28 | Discharge: 2020-11-28 | Disposition: A | Discharge status: Home / Self Care | Payer: Medicare Other | Source: Ambulatory Visit | Attending: Radiation Oncology | Admitting: Radiation Oncology

## 2020-11-28 ENCOUNTER — Encounter: Payer: Self-pay | Admitting: Radiation Oncology

## 2020-11-28 ENCOUNTER — Other Ambulatory Visit: Payer: Self-pay

## 2020-11-28 DIAGNOSIS — C01 Malignant neoplasm of base of tongue: Secondary | ICD-10-CM | POA: Diagnosis not present

## 2020-11-28 DIAGNOSIS — M5442 Lumbago with sciatica, left side: Secondary | ICD-10-CM | POA: Diagnosis not present

## 2020-11-28 DIAGNOSIS — M4316 Spondylolisthesis, lumbar region: Secondary | ICD-10-CM | POA: Diagnosis not present

## 2020-11-28 DIAGNOSIS — M5116 Intervertebral disc disorders with radiculopathy, lumbar region: Secondary | ICD-10-CM | POA: Diagnosis not present

## 2020-11-28 DIAGNOSIS — Z79899 Other long term (current) drug therapy: Secondary | ICD-10-CM | POA: Insufficient documentation

## 2020-11-28 DIAGNOSIS — Z85810 Personal history of malignant neoplasm of tongue: Secondary | ICD-10-CM | POA: Diagnosis not present

## 2020-11-28 DIAGNOSIS — K209 Esophagitis, unspecified without bleeding: Secondary | ICD-10-CM | POA: Insufficient documentation

## 2020-11-28 DIAGNOSIS — Z7982 Long term (current) use of aspirin: Secondary | ICD-10-CM | POA: Insufficient documentation

## 2020-11-28 DIAGNOSIS — M4317 Spondylolisthesis, lumbosacral region: Secondary | ICD-10-CM | POA: Insufficient documentation

## 2020-11-28 DIAGNOSIS — M4726 Other spondylosis with radiculopathy, lumbar region: Secondary | ICD-10-CM | POA: Diagnosis not present

## 2020-11-28 DIAGNOSIS — Z08 Encounter for follow-up examination after completed treatment for malignant neoplasm: Secondary | ICD-10-CM | POA: Diagnosis not present

## 2020-11-28 DIAGNOSIS — M545 Low back pain, unspecified: Secondary | ICD-10-CM | POA: Diagnosis not present

## 2020-11-28 DIAGNOSIS — M25552 Pain in left hip: Secondary | ICD-10-CM | POA: Diagnosis not present

## 2020-11-28 NOTE — Progress Notes (Signed)
Mr. Kozar presents today for follow-up after completing radiation to his base of tongue on 08/18/2020, and to review PET scan results from 11/24/2020  Pain issues, if any: no Using a feeding tube?: Yes, maybe 1-2 times daily Weight changes, if any: Gain 5 lbs since 11/10/2020  Swallowing issues, if any: eating 3 meals a day by mouth.  Denies choking or swallowing issues.  States that some foods / not all cause irritation to his mouth but not as much his throat.   Denies nausea/vomiting.   11/15/2020 saw Bridgett Larsson "When asked about pt's frequency of HEP - pt's completion remains less than prescribed- pt stated he thought now that his eating returned to more WNL the exercises are not as necessary. SLP reiterated that they cont to be necessary as prescribed adn provided rationale for this. SLP provided pt overt s/s aspiration PNA - pt told SLP 3 overt s/sx with modified independence. Pt agreed next session in 2 months due to progress with POs. Pt with most difficulty with Mendelsohn procedure and with initiation of swallow with MAsako. At this time pt swallowing is deemed WNL/WFL with regular food item (peanut butter crackers) and water. Pt cont to choose to complete HEP at lower than prescribed frequency. SLP reviewed pt's individualized HEP for dysphagia and pt completed each exercise on their own with eventually (pt req'd occasional min SLP cues for procedure of exercises). There are no overt s/s aspiration PNA reported by pt at this time" Smoking or chewing tobacco? no Using fluoride trays daily? Brush with fluoride daily Last ENT visit was on: Not since diagnosis Other notable issues, if any: Dry mouth occasionally/ thick saliva / taste changes   Vitals:   11/28/20 1422  BP: 108/77  Pulse: 67  Resp: 18  Temp: (!) 97.3 F (36.3 C)  TempSrc: Temporal  SpO2: 100%  Weight: 165 lb 6 oz (75 kg)  Height: 6' (1.829 m)

## 2020-11-29 ENCOUNTER — Encounter: Payer: Self-pay | Admitting: Radiation Oncology

## 2020-11-29 ENCOUNTER — Telehealth: Payer: Self-pay | Admitting: Nurse Practitioner

## 2020-11-29 NOTE — Telephone Encounter (Signed)
Scheduled follow-up appointment per 3/2 staff message. Patient is aware.

## 2020-11-29 NOTE — Progress Notes (Signed)
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[place]) Staging form: Pharynx - HPV-Mediated Oropharynx, AJCC 8th Edition - Clinical stage from 06/21/2020: Stage I (cT1, cN1, cM0, p16+) - Signed by Eppie Gibson, MD on 09/20/2020  Intent: Curative  Radiation Treatment Dates: 07/03/2020 through 08/18/2020 Site Technique Total Dose (Gy) Dose per Fx (Gy) Completed Fx Beam Energies  Oropharynx: HN_BOT IMRT 70/70 2 35/35 6X    CHIEF COMPLAINT:  Here for follow-up and surveillance of throat cancer  Narrative:    Patrick Nelson presents today for follow-up after completing radiation to his base of tongue on 08/18/2020, and to review PET scan results from 11/24/2020 - I personally reviewed his imaging  Pain issues, if any: no Using a feeding tube?: Yes, maybe 1-2 times daily Weight changes, if any: Gain 5 lbs since 11/10/2020  Swallowing issues, if any: eating 3 meals a day by mouth.  Denies choking or swallowing issues.  States that some foods / not all cause irritation to his mouth but not as much his throat.   Denies nausea/vomiting.   11/15/2020 saw Bridgett Larsson "When asked about pt's frequency of HEP - pt's completion remains less than prescribed- pt stated he thought now that his eating returned to more WNL the exercises are not as necessary. SLP reiterated that they cont to be necessary as prescribed adn provided rationale for this. SLP provided pt overt s/s aspiration PNA - pt told SLP 3 overt s/sx with modified independence. Pt agreed next session in 2 months due to progress with POs. Pt with most difficulty with Mendelsohn procedure and with initiation of swallow with MAsako. At this time pt  swallowing is deemed WNL/WFL with regular food item (peanut butter crackers) and water. Pt cont to choose to complete HEP at lower than prescribed frequency. SLP reviewed pt's individualized HEP for dysphagia and pt completed each exercise on their own with eventually (pt req'd occasional min SLP cues for procedure of exercises). There are no overt s/s aspiration PNA reported by pt at this time" Smoking or chewing tobacco? no Using fluoride trays daily? Brush with fluoride daily Last ENT visit was on: Not since diagnosis Other notable issues, if any: Dry mouth occasionally/ thick saliva / taste changes   Vitals:   11/28/20 1422  BP: 108/77  Pulse: 67  Resp: 18  Temp: (!) 97.3 F (36.3 C)  TempSrc: Temporal  SpO2: 100%  Weight: 165 lb 6 oz (75 kg)  Height: 6' (1.829 m)      ALLERGIES:  has No Known Allergies.  Meds: Current Outpatient Medications  Medication Sig Dispense Refill  . atorvastatin (LIPITOR) 10 MG tablet Take 10 mg by mouth daily.    . Cholecalciferol (VITAMIN D3 SUPER STRENGTH) 50 MCG (2000 UT) TABS Take 2,000 Units by mouth.    . cyanocobalamin 1000 MCG tablet Take 1,000 mcg by mouth daily.    . folic acid (FOLVITE) 294 MCG tablet Take 400 mcg by mouth daily.    . Misc Natural Products (CHROMIUM PICOLINATE FORTIFIED PO) Take by mouth.    . Nutritional Supplements (KATE FARMS STANDARD 1.4) LIQD 1 Container by Enteral route 5 (five) times daily. 1625 mL 8  . sodium fluoride (PREVIDENT  5000 PLUS) 1.1 % CREA dental cream Apply to tooth brush. Brush teeth for 2 minutes. Spit out excess-DO NOT swallow. Repeat nightly. 102 g prn  . amoxicillin (AMOXIL) 500 MG capsule Take four capsules one hour before dental appointment. (Patient not taking: Reported on 11/28/2020) 4 capsule 3  . aspirin 81 MG chewable tablet Chew by mouth daily.  (Patient not taking: Reported on 11/28/2020)    . lidocaine-prilocaine (EMLA) cream Apply 1 application topically as needed. (Patient not taking:  Reported on 11/28/2020) 30 g 0  . ondansetron (ZOFRAN) 8 MG tablet Take 1 tablet (8 mg total) by mouth every 8 (eight) hours as needed for nausea or vomiting. (Patient not taking: No sig reported) 20 tablet 0  . prochlorperazine (COMPAZINE) 10 MG tablet Take 1 tablet (10 mg total) by mouth every 6 (six) hours as needed for nausea or vomiting. (Patient not taking: No sig reported) 30 tablet 0  . Sennosides (SENNA) 8.8 MG/5ML LIQD Take 10 mLs by mouth 2 (two) times daily as needed. (Patient not taking: Reported on 11/28/2020) 105 mL 1   No current facility-administered medications for this encounter.    Physical Findings: The patient is in no acute distress. Patient is alert and oriented. Wt Readings from Last 3 Encounters:  11/28/20 165 lb 6 oz (75 kg)  11/10/20 160 lb 6.4 oz (72.8 kg)  10/19/20 159 lb 3.2 oz (72.2 kg)    height is 6' (1.829 m) and weight is 165 lb 6 oz (75 kg). His temporal temperature is 97.3 F (36.3 C) (abnormal). His blood pressure is 108/77 and his pulse is 67. His respiration is 18 and oxygen saturation is 100%.     General: Alert and oriented, in no acute distress HEENT: Head is normocephalic. Extraocular movements are intact. Oropharynx is clear. Neck: Neck is supple, no palpable cervical or supraclavicular lymphadenopathy. Skin: No concerning lesions.  Satisfactory healing of her neck and radiation fields Musculoskeletal: symmetric strength and muscle tone throughout. Neurologic: Cranial nerves II through XII are grossly intact. No obvious focalities. Speech is fluent. Coordination is intact. Psychiatric: Judgment and insight are intact. Affect is appropriate.    Lab Findings: Lab Results  Component Value Date   WBC 4.1 11/10/2020   HGB 11.9 (L) 11/10/2020   HCT 33.8 (L) 11/10/2020   MCV 98.5 11/10/2020   PLT 160 11/10/2020    Lab Results  Component Value Date   TSH 1.831 10/19/2020    Radiographic Findings: MR LUMBAR SPINE WO CONTRAST  Result Date:  11/23/2020 CLINICAL DATA:  Low back pain, unspecified back pain laterality, unspecified chronicity, unspecified whether sciatica present. Additional history provided by scanning technologist: Patient reports low back and left leg pain for 2 months after fall, history of throat/neck cancer. EXAM: MRI LUMBAR SPINE WITHOUT CONTRAST TECHNIQUE: Multiplanar, multisequence MR imaging of the lumbar spine was performed. No intravenous contrast was administered. COMPARISON:  PET-CT 06/06/2020.  CT abdomen/pelvis 03/31/2013 FINDINGS: Segmentation:  5 lumbar vertebrae. Alignment: Straightening of the expected lumbar lordosis. 2 mm L3-L4 grade 1 anterolisthesis. Trace L5-S1 grade 1 retrolisthesis. Vertebrae: Vertebral body height is maintained. No significant marrow edema or focal suspicious osseous lesion. Conus medullaris and cauda equina: Conus extends to the L1-L2 level. No signal abnormality within the visualized distal spinal cord. Paraspinal and other soft tissues: Subcentimeter T2 hyperintense lesion within the liver, incompletely assessed on the current exam but possibly reflecting a cyst. Paraspinal soft tissues within normal limits. Disc levels: Multilevel disc degeneration. Most notably, there is  moderate to moderately advanced disc degeneration at L2-L3 and L3-L4. T12-L1: Small disc bulge. Mild facet arthrosis. No significant spinal canal or foraminal stenosis. L1-L2: Disc bulge. Superimposed small right subarticular/foraminal disc protrusion. Mild-to-moderate facet arthrosis. Trace bilateral facet joint effusions. Mild right subarticular narrowing with slight crowding of the descending right L2 nerve root (series 6, image 10). Central canal patent. Mild/moderate right neural foraminal narrowing. L2-L3: Disc bulge with endplate spurring. Mild facet arthrosis. Minimal partial effacement of the ventral thecal sac without significant subarticular or central canal stenosis. Mild left neural foraminal narrowing. L3-L4:  Grade 1 anterolisthesis. Right foraminal zone posterior annular fissure. Disc uncovering with disc bulge and endplate spurring. Superimposed broad-based central to left foraminal disc protrusion. Moderate facet arthrosis with ligamentum flavum hypertrophy. Trace bilateral facet joint effusions. The disc protrusion contributes to severe left subarticular stenosis, encroaching upon the descending left L4 nerve root (series 6, image 24). Mild right subarticular narrowing. Mild relative narrowing of the central canal. Bilateral neural foraminal narrowing (mild/moderate right, moderate left). L4-L5: Disc bulge. Superimposed left foraminal cranially migrated disc extrusion (series 5, images 12 and 13). Mild facet arthrosis. Trace bilateral facet joint effusions. No significant spinal canal stenosis. The disc protrusion contributes to severe left neural foraminal narrowing and impinges the exiting left L4 nerve root. Mild/moderate right neural foraminal narrowing. L5-S1: Trace retrolisthesis. Small disc bulge with endplate spurring. Superimposed right subarticular/foraminal disc protrusion at site of posterior annular fissure. Mild right subarticular narrowing without appreciable nerve root impingement. Central canal patent. Mild/moderate right neural foraminal narrowing. IMPRESSION: Lumbar spondylosis as outlined and with findings most notably as follows. At L4-L5, a left foraminal cranially migrated disc extrusion contributes to severe left neural foraminal narrowing and impinges the exiting left L4 nerve root. Correlate for left L4 radiculopathy. Multifactorial mild/moderate right neural foraminal narrowing also present at this level. At L3-L4, a broad-based central to left foraminal disc protrusion contributes to multifactorial severe left subarticular stenosis and encroaches upon the descending left L4 nerve root. Also at this level, there is multifactorial mild right subarticular, mild central canal and bilateral  neural foraminal narrowing (mild/moderate right, moderate left). No more than mild spinal canal stenosis at the remaining levels. Additional sites of mild and mild/moderate neural foraminal narrowing as detailed. Electronically Signed   By: Kellie Simmering DO   On: 11/23/2020 08:48   NM PET Image Restag (PS) Skull Base To Thigh  Result Date: 11/25/2020 CLINICAL DATA:  Initial treatment strategy for head neck carcinoma. EXAM: NUCLEAR MEDICINE PET SKULL BASE TO THIGH TECHNIQUE: 8.1 mCi F-18 FDG was injected intravenously. Full-ring PET imaging was performed from the skull base to thigh after the radiotracer. CT data was obtained and used for attenuation correction and anatomic localization. Fasting blood glucose: 93 mg/dl COMPARISON:  PET-CT scan 06/06/2020 FINDINGS: Mediastinal blood pool activity: SUV max 1.8 Liver activity: SUV max NA NECK: Resolution of the hypermetabolic activity at the RIGHT base of tongue. Resolution of hypermetabolic RIGHT level II lymph nodes. No hypermetabolic or enlarged cervical lymph nodes remain. Incidental CT findings: Port in the anterior chest wall with tip in distal SVC. CHEST: Mild symmetric metabolic activity in small hilar lymph nodes favored benign. Mild activity through the entire esophagus is consistent with mild esophagitis. Incidental CT findings: No suspicious pulmonary nodules ABDOMEN/PELVIS: Low-density lesion the RIGHT hepatic lobe unchanged from prior and without metabolic activity. No hypermetabolic abdominopelvic lymph nodes. Incidental CT findings: Percutaneous gastrostomy tube in the stomach. SKELETON: No focal hypermetabolic activity to suggest skeletal metastasis.  Incidental CT findings: RIGHT hip prosthetic. IMPRESSION: 1. 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 2. No evidence of metastatic adenopathy in the neck. 3. No evidence of distant metastatic disease. 4. Mild esophagitis. Electronically Signed   By: Suzy Bouchard M.D.   On:  11/25/2020 08:58    Impression/Plan:    1) Head and Neck Cancer Status: NED -complete response on PET scan and per physical exam  2) Nutritional Status:  Maintaining weight and still using PEG for a fraction of his nutrition.  He will let us know when he is able to sustain his nutrition entirely by mouth for a full month while maintaining his weight.  At that time he will be ready to have his PEG removed. Wt Readings from Last 3 Encounters:  11/28/20 165 lb 6 oz (75 kg)  11/10/20 160 lb 6.4 oz (72.8 kg)  10/19/20 159 lb 3.2 oz (72.2 kg)     3)   Swallowing: Satisfactory function, continue swallowing exercises  4) Dental: Encouraged to continue regular followup with dentistry, and dental hygiene including fluoride rinses.   5) Thyroid function: Check annually Lab Results  Component Value Date   TSH 1.831 10/19/2020    6)   Continue to follow-up with medical oncology.  We will refer him back to his otolaryngologist.  We will also refer him to the head and neck survivorship clinic.  I will see him back in 1 year.  On date of service, in total, I spent 25 minutes on this encounter.    _____________________________________   Eppie Gibson, MD

## 2020-11-29 NOTE — Progress Notes (Signed)
Oncology Nurse Navigator Documentation  Per patient's 11/28/20 post-treatment follow-up with Dr. Isidore Moos, sent fax to [facility] ENT Scheduling with request Mr. Atallah be contacted and scheduled for routine post-RT follow-up with Dr. Constance Holster in 3 months.  Notification of successful fax transmission received.  I have also sent a message to Cira Rue NP, Wilber Bihari NP and their schedulers to schedule Mr. Hartog for a 6 month head and neck survivorship appointment.  Harlow Asa RN, BSN, OCN Head & Neck Oncology Nurse [place] [facility] Phone # [phone]  Fax # [phone]

## 2020-12-05 ENCOUNTER — Telehealth: Payer: Self-pay | Admitting: Nutrition

## 2020-12-05 DIAGNOSIS — M5416 Radiculopathy, lumbar region: Secondary | ICD-10-CM | POA: Diagnosis not present

## 2020-12-05 NOTE — Telephone Encounter (Signed)
Contacted patient by telephone for nutrition follow-up status post treatment for right tongue cancer. Last weight documented was improved at 165.3 pounds March 1.  This is increased from 159.2 pounds January 20. Patient reports he has not used his feeding tube for 2 weeks.  He is consuming adequate diet by mouth to promote weight gain. Reports he feels well and has no questions or concerns. Would like to have his feeding tube removed and plans to speak to MD about this at next appointment. Congratulated patient on increased oral intake and discontinuation of tube feeding usage.  Encouraged him to contact me with any questions or concerns.  Patient very appreciative.

## 2020-12-08 ENCOUNTER — Other Ambulatory Visit: Payer: Self-pay

## 2020-12-08 ENCOUNTER — Inpatient Hospital Stay: Payer: Medicare Other | Attending: Hematology and Oncology | Admitting: Hematology and Oncology

## 2020-12-08 ENCOUNTER — Inpatient Hospital Stay: Payer: Medicare Other

## 2020-12-08 ENCOUNTER — Encounter: Payer: Self-pay | Admitting: Hematology and Oncology

## 2020-12-08 ENCOUNTER — Other Ambulatory Visit: Payer: Self-pay | Admitting: Hematology and Oncology

## 2020-12-08 VITALS — BP 126/86 | HR 61 | Temp 97.8°F | Resp 20 | Ht 72.0 in | Wt 165.0 lb

## 2020-12-08 DIAGNOSIS — I480 Paroxysmal atrial fibrillation: Secondary | ICD-10-CM | POA: Insufficient documentation

## 2020-12-08 DIAGNOSIS — C01 Malignant neoplasm of base of tongue: Secondary | ICD-10-CM

## 2020-12-08 DIAGNOSIS — Z923 Personal history of irradiation: Secondary | ICD-10-CM | POA: Insufficient documentation

## 2020-12-08 DIAGNOSIS — Z95828 Presence of other vascular implants and grafts: Secondary | ICD-10-CM

## 2020-12-08 DIAGNOSIS — Z9221 Personal history of antineoplastic chemotherapy: Secondary | ICD-10-CM | POA: Insufficient documentation

## 2020-12-08 DIAGNOSIS — Z85810 Personal history of malignant neoplasm of tongue: Secondary | ICD-10-CM | POA: Insufficient documentation

## 2020-12-08 DIAGNOSIS — Z8249 Family history of ischemic heart disease and other diseases of the circulatory system: Secondary | ICD-10-CM | POA: Insufficient documentation

## 2020-12-08 DIAGNOSIS — Z7982 Long term (current) use of aspirin: Secondary | ICD-10-CM | POA: Diagnosis not present

## 2020-12-08 DIAGNOSIS — Z79899 Other long term (current) drug therapy: Secondary | ICD-10-CM | POA: Diagnosis not present

## 2020-12-08 LAB — CMP (CANCER CENTER ONLY)
ALT: 17 U/L (ref 0–44)
AST: 17 U/L (ref 15–41)
Albumin: 3.5 g/dL (ref 3.5–5.0)
Alkaline Phosphatase: 85 U/L (ref 38–126)
Anion gap: 5 (ref 5–15)
BUN: 11 mg/dL (ref 8–23)
CO2: 27 mmol/L (ref 22–32)
Calcium: 9.2 mg/dL (ref 8.9–10.3)
Chloride: 102 mmol/L (ref 98–111)
Creatinine: 0.8 mg/dL (ref 0.61–1.24)
GFR, Estimated: >60 mL/min (ref >60)
Glucose, Bld: 82 mg/dL (ref 70–99)
Potassium: 4.3 mmol/L (ref 3.5–5.1)
Sodium: 134 mmol/L — ABNORMAL LOW (ref 135–145)
Total Bilirubin: 0.4 mg/dL (ref 0.3–1.2)
Total Protein: 6 g/dL — ABNORMAL LOW (ref 6.5–8.1)

## 2020-12-08 LAB — CBC WITH DIFFERENTIAL (CANCER CENTER ONLY)
Abs Immature Granulocytes: 0.11 10*3/uL — ABNORMAL HIGH (ref 0.00–0.07)
Basophils Absolute: 0 10*3/uL (ref 0.0–0.1)
Basophils Relative: 1 %
Eosinophils Absolute: 0.2 10*3/uL (ref 0.0–0.5)
Eosinophils Relative: 3 %
HCT: 38.7 % — ABNORMAL LOW (ref 39.0–52.0)
Hemoglobin: 13.1 g/dL (ref 13.0–17.0)
Immature Granulocytes: 2 %
Lymphocytes Relative: 24 %
Lymphs Abs: 1.1 10*3/uL (ref 0.7–4.0)
MCH: 33.2 pg (ref 26.0–34.0)
MCHC: 33.9 g/dL (ref 30.0–36.0)
MCV: 98 fL (ref 80.0–100.0)
Monocytes Absolute: 0.6 10*3/uL (ref 0.1–1.0)
Monocytes Relative: 13 %
Neutro Abs: 2.7 10*3/uL (ref 1.7–7.7)
Neutrophils Relative %: 57 %
Platelet Count: 166 10*3/uL (ref 150–400)
RBC: 3.95 MIL/uL — ABNORMAL LOW (ref 4.22–5.81)
RDW: 12.8 % (ref 11.5–15.5)
WBC Count: 4.7 10*3/uL (ref 4.0–10.5)
nRBC: 0 % (ref 0.0–0.2)

## 2020-12-08 LAB — TSH: TSH: 1.485 u[IU]/mL (ref 0.320–4.118)

## 2020-12-08 NOTE — Progress Notes (Signed)
[place] [phone]   [phone]  PROGRESS NOTE  Patient Care Team: Wenda Low, MD as PCP - General (Internal Medicine) Malmfelt, Stephani Police, RN as Oncology Nurse Navigator Eppie Gibson, MD as Consulting Physician (Radiation Oncology) Orson Slick, MD as Consulting Physician (Hematology and Oncology) Sharen Counter, CCC-SLP as Speech Language Pathologist (Speech Pathology) Wynelle Beckmann, Melodie Bouillon, PT as Physical Therapist (Physical Therapy) Beverely Pace, LCSW as Social Worker (General Practice) Karie Mainland, RD as Dietitian (Nutrition) Izora Gala, MD as Consulting Physician (Otolaryngology)  Hematological/Oncological History 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 Mass biopsied at the right base of the tongue. Biopsy showed invasive moderately differentiated squamous cell carcinoma 3) 06/06/2020: PET CT scan showed hypermetabolic right tongue base mass consistent with known neoplasm. Right level 2 and level 3 metastatic adenopathy. No evidence of metastatic disease.  4) 06/09/2020: established care with Dr. Isidore Moos 5) 06/12/2020: establish care with Dr. Lorenso Courier  6) 07/03/2020: Week 1 of Cisplatin chemoradiation 7) 07/10/2020: Week 2 of Cisplatin chemoradiation 8) 07/17/2020: Week 3 of Cisplatin chemoradiation 9) 07/24/2020: Week 4 of Cisplatin chemoradiation 10) 07/31/2020: Week 5 of Cisplatin chemoradiation 11) 08/07/2020: Week 6 of Cisplatin chemoradiation 12) 08/14/2020: Week 7 of Cisplatin chemoradiation 13) 08/18/2020: end of radiation therapy.  14) 11/24/2020: PET CT scan shows a complete remission, no FDG avid lesions or concern for residual/recurrent disease.   Interval  History:  Rudolph Daoust 70 y.o. male with medical history significant for SCC of the base of the tongue who presents for a follow up visit. The patient's last visit was on 11/10/2020. In the interim since the last visit he had his f/u PET CT scan which showed no FDG avid lesions or concern for residual/recurrent disease.  On exam today Mr. Reish reports he has been doing quite well in the interim since her last visit.  For the last 2 to 3 weeks he has been taking all of his nutrition 100% p.o.  He has gained approximately 5 pounds in weight since that time.  He notes that he is able to tolerate most foods but is not able tolerate "hot stuff".  He is elated to hear that his PET CT scan shows a complete response.  His energy has been good and his appetite has been excellent.  He is plan to get a shot in his hip for his chronic hip pain on 3/23. Otherwise he has no questions comments or concerns today.  He is also not been having any issues with nausea, vomiting, or diarrhea.  His bowels have been moving regularly.  A full 10 point ROS is listed below.  MEDICAL HISTORY:  Past Medical History:  Diagnosis Date  . Hyperlipidemia   . Paroxysmal atrial fibrillation ([place])     SURGICAL HISTORY: Past Surgical History:  Procedure Laterality Date  . DIRECT LARYNGOSCOPY N/A 05/22/2020   Procedure: DIRECT LARYNGOSCOPY w/BIOPSY TONGUE BASE;  Surgeon: Izora Gala, MD;  Location: [facility];  Service: ENT;  Laterality: N/A;  . IR GASTROSTOMY TUBE MOD SED  06/26/2020  . IR IMAGING GUIDED PORT INSERTION  06/26/2020  . TOTAL HIP ARTHROPLASTY  2012   right hip-Dr. Berenice Primas  SOCIAL HISTORY: Social History   Socioeconomic History  . Marital status: Significant Other    Spouse name: Not on file  . Number of children: 1  . Years of education: Not on file  . Highest education level: Not on file  Occupational History  . Not on file  Tobacco Use  . Smoking status: Never Smoker  . Smokeless  tobacco: Never Used  Vaping Use  . Vaping Use: Never used  Substance and Sexual Activity  . Alcohol use: Yes  . Drug use: No  . Sexual activity: Yes  Other Topics Concern  . Not on file  Social History Narrative  . Not on file   Social Determinants of Health   Financial Resource Strain: Low Risk   . Difficulty of Paying Living Expenses: Not hard at all  Food Insecurity: No Food Insecurity  . Worried About Charity fundraiser in the Last Year: Never true  . Ran Out of Food in the Last Year: Never true  Transportation Needs: No Transportation Needs  . Lack of Transportation (Medical): No  . Lack of Transportation (Non-Medical): No  Physical Activity: Not on file  Stress: Not on file  Social Connections: Moderately Isolated  . Frequency of Communication with Friends and Family: More than three times a week  . Frequency of Social Gatherings with Friends and Family: More than three times a week  . Attends Religious Services: Never  . Active Member of Clubs or Organizations: No  . Attends Archivist Meetings: Not on file  . Marital Status: Married  Human resources officer Violence: Not on file    FAMILY HISTORY: Family History  Problem Relation Age of Onset  . Hypertension Mother   . Heart disease Mother   . Heart disease Brother     ALLERGIES:  has No Known Allergies.  MEDICATIONS:  Current Outpatient Medications  Medication Sig Dispense Refill  . amoxicillin (AMOXIL) 500 MG capsule Take four capsules one hour before dental appointment. (Patient not taking: Reported on 11/28/2020) 4 capsule 3  . aspirin 81 MG chewable tablet Chew by mouth daily.  (Patient not taking: Reported on 11/28/2020)    . atorvastatin (LIPITOR) 10 MG tablet Take 10 mg by mouth daily.    . Cholecalciferol (VITAMIN D3 SUPER STRENGTH) 50 MCG (2000 UT) TABS Take 2,000 Units by mouth.    . cyanocobalamin 1000 MCG tablet Take 1,000 mcg by mouth daily.    . folic acid (FOLVITE) 664 MCG tablet Take 400  mcg by mouth daily.    Marland Kitchen lidocaine-prilocaine (EMLA) cream Apply 1 application topically as needed. (Patient not taking: Reported on 11/28/2020) 30 g 0  . Misc Natural Products (CHROMIUM PICOLINATE FORTIFIED PO) Take by mouth.    . Nutritional Supplements (KATE FARMS STANDARD 1.4) LIQD 1 Container by Enteral route 5 (five) times daily. 1625 mL 8  . ondansetron (ZOFRAN) 8 MG tablet Take 1 tablet (8 mg total) by mouth every 8 (eight) hours as needed for nausea or vomiting. (Patient not taking: No sig reported) 20 tablet 0  . prochlorperazine (COMPAZINE) 10 MG tablet Take 1 tablet (10 mg total) by mouth every 6 (six) hours as needed for nausea or vomiting. (Patient not taking: No sig reported) 30 tablet 0  . Sennosides (SENNA) 8.8 MG/5ML LIQD Take 10 mLs by mouth 2 (two) times daily as needed. (Patient not taking: Reported on 11/28/2020) 105 mL 1  . sodium fluoride (PREVIDENT 5000 PLUS) 1.1 % CREA dental cream Apply to tooth  brush. Brush teeth for 2 minutes. Spit out excess-DO NOT swallow. Repeat nightly. 102 g prn   No current facility-administered medications for this visit.    REVIEW OF SYSTEMS:   Constitutional: ( - ) fevers, ( - )  chills , ( - ) night sweats Eyes: ( - ) blurriness of vision, ( - ) double vision, ( - ) watery eyes Ears, nose, mouth, throat, and face: ( - ) mucositis, ( - ) sore throat Respiratory: ( - ) cough, ( - ) dyspnea, ( - ) wheezes Cardiovascular: ( - ) palpitation, ( - ) chest discomfort, ( - ) lower extremity swelling Gastrointestinal:  ( - ) nausea, ( - ) heartburn, ( - ) change in bowel habits Skin: ( - ) abnormal skin rashes Lymphatics: ( - ) new lymphadenopathy, ( - ) easy bruising Neurological: ( - ) numbness, ( - ) tingling, ( - ) new weaknesses Behavioral/Psych: ( - ) mood change, ( - ) new changes  All other systems were reviewed with the patient and are negative.  PHYSICAL EXAMINATION: ECOG PERFORMANCE STATUS: 0 - Asymptomatic  Vitals:   12/08/20 1054   BP: 126/86  Pulse: 61  Resp: 20  Temp: 97.8 F (36.6 C)  SpO2: 99%   Filed Weights   12/08/20 1054  Weight: 165 lb (74.8 kg)    GENERAL: well appearing elderly Caucasian male alert, no distress and comfortable SKIN:  Normal tugor, no more erythema around neck. No skin lesions noted. EYES: conjunctiva are pink and non-injected, sclera clear OROPHARYNX: considerably less erythema of the right back of throat. Thick saliva. No evidence of ulcer/infection.  LUNGS: clear to auscultation and percussion with normal breathing effort HEART: regular rate & rhythm and no murmurs and no lower extremity edema Musculoskeletal: no cyanosis of digits and no clubbing  PSYCH: alert & oriented x 3, fluent speech NEURO: no focal motor/sensory deficits  LABORATORY DATA:  I have reviewed the data as listed CBC Latest Ref Rng & Units 12/08/2020 11/10/2020 10/19/2020  WBC 4.0 - 10.5 K/uL 4.7 4.1 4.1  Hemoglobin 13.0 - 17.0 g/dL 13.1 11.9(L) 11.9(L)  Hematocrit 39.0 - 52.0 % 38.7(L) 33.8(L) 33.9(L)  Platelets 150 - 400 K/uL 166 160 163    CMP Latest Ref Rng & Units 11/10/2020 10/19/2020 09/15/2020  Glucose 70 - 99 mg/dL 139(H) 82 100(H)  BUN 8 - 23 mg/dL 12 17 19   Creatinine 0.61 - 1.24 mg/dL 0.73 0.71 0.78  Sodium 135 - 145 mmol/L 133(L) 132(L) 130(L)  Potassium 3.5 - 5.1 mmol/L 4.5 4.2 4.5  Chloride 98 - 111 mmol/L 102 97(L) 95(L)  CO2 22 - 32 mmol/L 25 25 28   Calcium 8.9 - 10.3 mg/dL 9.0 9.1 9.4  Total Protein 6.5 - 8.1 g/dL 6.0(L) 6.1(L) 6.1(L)  Total Bilirubin 0.3 - 1.2 mg/dL 0.4 0.4 0.4  Alkaline Phos 38 - 126 U/L 74 68 78  AST 15 - 41 U/L 18 16 16   ALT 0 - 44 U/L 22 13 14     RADIOGRAPHIC STUDIES: MR LUMBAR SPINE WO CONTRAST  Result Date: 11/23/2020 CLINICAL DATA:  Low back pain, unspecified back pain laterality, unspecified chronicity, unspecified whether sciatica present. Additional history provided by scanning technologist: Patient reports low back and left leg pain for 2 months after  fall, history of throat/neck cancer. EXAM: MRI LUMBAR SPINE WITHOUT CONTRAST TECHNIQUE: Multiplanar, multisequence MR imaging of the lumbar spine was performed. No intravenous contrast was administered. COMPARISON:  PET-CT 06/06/2020.  CT abdomen/pelvis 03/31/2013 FINDINGS:  Segmentation:  5 lumbar vertebrae. Alignment: Straightening of the expected lumbar lordosis. 2 mm L3-L4 grade 1 anterolisthesis. Trace L5-S1 grade 1 retrolisthesis. Vertebrae: Vertebral body height is maintained. No significant marrow edema or focal suspicious osseous lesion. Conus medullaris and cauda equina: Conus extends to the L1-L2 level. No signal abnormality within the visualized distal spinal cord. Paraspinal and other soft tissues: Subcentimeter T2 hyperintense lesion within the liver, incompletely assessed on the current exam but possibly reflecting a cyst. Paraspinal soft tissues within normal limits. Disc levels: Multilevel disc degeneration. Most notably, there is moderate to moderately advanced disc degeneration at L2-L3 and L3-L4. T12-L1: Small disc bulge. Mild facet arthrosis. No significant spinal canal or foraminal stenosis. L1-L2: Disc bulge. Superimposed small right subarticular/foraminal disc protrusion. Mild-to-moderate facet arthrosis. Trace bilateral facet joint effusions. Mild right subarticular narrowing with slight crowding of the descending right L2 nerve root (series 6, image 10). Central canal patent. Mild/moderate right neural foraminal narrowing. L2-L3: Disc bulge with endplate spurring. Mild facet arthrosis. Minimal partial effacement of the ventral thecal sac without significant subarticular or central canal stenosis. Mild left neural foraminal narrowing. L3-L4: Grade 1 anterolisthesis. Right foraminal zone posterior annular fissure. Disc uncovering with disc bulge and endplate spurring. Superimposed broad-based central to left foraminal disc protrusion. Moderate facet arthrosis with ligamentum flavum  hypertrophy. Trace bilateral facet joint effusions. The disc protrusion contributes to severe left subarticular stenosis, encroaching upon the descending left L4 nerve root (series 6, image 24). Mild right subarticular narrowing. Mild relative narrowing of the central canal. Bilateral neural foraminal narrowing (mild/moderate right, moderate left). L4-L5: Disc bulge. Superimposed left foraminal cranially migrated disc extrusion (series 5, images 12 and 13). Mild facet arthrosis. Trace bilateral facet joint effusions. No significant spinal canal stenosis. The disc protrusion contributes to severe left neural foraminal narrowing and impinges the exiting left L4 nerve root. Mild/moderate right neural foraminal narrowing. L5-S1: Trace retrolisthesis. Small disc bulge with endplate spurring. Superimposed right subarticular/foraminal disc protrusion at site of posterior annular fissure. Mild right subarticular narrowing without appreciable nerve root impingement. Central canal patent. Mild/moderate right neural foraminal narrowing. IMPRESSION: Lumbar spondylosis as outlined and with findings most notably as follows. At L4-L5, a left foraminal cranially migrated disc extrusion contributes to severe left neural foraminal narrowing and impinges the exiting left L4 nerve root. Correlate for left L4 radiculopathy. Multifactorial mild/moderate right neural foraminal narrowing also present at this level. At L3-L4, a broad-based central to left foraminal disc protrusion contributes to multifactorial severe left subarticular stenosis and encroaches upon the descending left L4 nerve root. Also at this level, there is multifactorial mild right subarticular, mild central canal and bilateral neural foraminal narrowing (mild/moderate right, moderate left). No more than mild spinal canal stenosis at the remaining levels. Additional sites of mild and mild/moderate neural foraminal narrowing as detailed. Electronically Signed   By: Kellie Simmering DO   On: 11/23/2020 08:48   NM PET Image Restag (PS) Skull Base To Thigh  Result Date: 11/25/2020 CLINICAL DATA:  Initial treatment strategy for head neck carcinoma. EXAM: NUCLEAR MEDICINE PET SKULL BASE TO THIGH TECHNIQUE: 8.1 mCi F-18 FDG was injected intravenously. Full-ring PET imaging was performed from the skull base to thigh after the radiotracer. CT data was obtained and used for attenuation correction and anatomic localization. Fasting blood glucose: 93 mg/dl COMPARISON:  PET-CT scan 06/06/2020 FINDINGS: Mediastinal blood pool activity: SUV max 1.8 Liver activity: SUV max NA NECK: Resolution of the hypermetabolic activity at the RIGHT base of tongue. Resolution  of hypermetabolic RIGHT level II lymph nodes. No hypermetabolic or enlarged cervical lymph nodes remain. Incidental CT findings: Port in the anterior chest wall with tip in distal SVC. CHEST: Mild symmetric metabolic activity in small hilar lymph nodes favored benign. Mild activity through the entire esophagus is consistent with mild esophagitis. Incidental CT findings: No suspicious pulmonary nodules ABDOMEN/PELVIS: Low-density lesion the RIGHT hepatic lobe unchanged from prior and without metabolic activity. No hypermetabolic abdominopelvic lymph nodes. Incidental CT findings: Percutaneous gastrostomy tube in the stomach. SKELETON: No focal hypermetabolic activity to suggest skeletal metastasis. Incidental CT findings: RIGHT hip prosthetic. IMPRESSION: 1. 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 2. No evidence of metastatic adenopathy in the neck. 3. No evidence of distant metastatic disease. 4. Mild esophagitis. Electronically Signed   By: Suzy Bouchard M.D.   On: 11/25/2020 08:58    ASSESSMENT & PLAN Patrick Nelson 70 y.o. male with medical history significant for SCC of the base of the tongue who presents for a follow up visit.  Review the labs, review the records, discussion with the patient the findings  are most consistent with a squamous cell carcinoma of the back base of the tongue.  This was a T1 N1 M0 p16 positive cancer which makes it stage I.  Overall the prognosis for this is quite well and there is an excellent chance of durable remission after chemoradiation therapy with cisplatin.   On exam today Mr. Smeltz is taking all of his incision 100% by mouth.  His PET CT scan showed a complete response.  He is in good spirits and improving steadily..  Overall he appears improved from prior.   Previously we discussed treatment moving forward and the expected side effects and symptoms.  Side effects include but are not limited to fatigue, anemia, kidney dysfunction, nausea, vomiting, diarrhea, throat pain, weight loss, and neuropathy.  Patient his wife were agreeable to proceeding with treatment.  We also discussed the medications to help with potential side effects including Zofran as needed, Compazine as needed, and EMLA cream.    The treatment of choice for this patient will be radiation with concurrent cisplatin 40 mg per metered squared weekly x7 doses.  We started this on 07/03/2020 and had weekly visits with the patient while he is receiving this treatment. He completed chemotherapy on 08/14/2020.   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 P16+, Stage I  --patient completed 7 weekly doses of cisplatin 40mg /m2 administered with radiation on 08/18/2020 --supportive care as noted below. --at each visit check CMP, CBC and periodic TSH  --PET CT scan on 11/24/2020 shows that the patient is in a complete remission. Proceed with fibro-optic exams with ENT/Rad Onc moving forward --RTC in 4 weeks time for continued monitoring.   #Pain Control --prescribed liquid roxicet 60ml q 6H PRN. Not using. No longer required.   #Supportive Care --patient provided with zofran 8mg  q8H PRN and compazine 10mg  q6H PRN.  --EMLA cream for port provided --PEG and Port in place. Patient on  100% PO and PET shows CR. Will order to have these removed today.  --pain control as above --erythematous radiation rash resolved with topical treatments.   Orders Placed This Encounter  Procedures  . IR Removal Tun Access W/ Port W/O FL    Standing Status:   Future    Standing Expiration Date:   12/08/2021    Order Specific Question:   Reason for exam:    Answer:  completed treatment, requesting port removal    Order Specific Question:   Preferred Imaging Location?    Answer:   [facility] TUBE REMOVAL    Standing Status:   Future    Standing Expiration Date:   12/08/2021    Order Specific Question:   Reason for Exam (SYMPTOM  OR DIAGNOSIS REQUIRED)    Answer:   tolerating PO with weight gain, requesting removal    Order Specific Question:   Preferred Imaging Location?    Answer:   [facility]   All questions were answered. The patient knows to call the clinic with any problems, questions or concerns.  A total of more than 30 minutes were spent on this encounter and over half of that time was spent on counseling and coordination of care as outlined above.   Ledell Peoples, MD Department of Hematology/Oncology [place] at [facility] Phone: [phone] Pager: [phone] Email: [email or web address]  12/08/2020 11:13 AM

## 2020-12-10 ENCOUNTER — Encounter: Payer: Self-pay | Admitting: Hematology and Oncology

## 2020-12-11 ENCOUNTER — Telehealth: Payer: Self-pay | Admitting: Hematology and Oncology

## 2020-12-11 NOTE — Telephone Encounter (Signed)
Scheduled per los. Called and spoke with patient. Confirmed appt 

## 2020-12-20 DIAGNOSIS — M5416 Radiculopathy, lumbar region: Secondary | ICD-10-CM | POA: Diagnosis not present

## 2020-12-22 ENCOUNTER — Other Ambulatory Visit: Payer: Self-pay | Admitting: Radiology

## 2020-12-25 ENCOUNTER — Ambulatory Visit (HOSPITAL_COMMUNITY)
Admission: RE | Admit: 2020-12-25 | Discharge: 2020-12-25 | Disposition: A | Discharge status: Home / Self Care | Payer: Medicare Other | Source: Ambulatory Visit | Attending: Hematology and Oncology | Admitting: Hematology and Oncology

## 2020-12-25 ENCOUNTER — Encounter (HOSPITAL_COMMUNITY): Payer: Self-pay

## 2020-12-25 ENCOUNTER — Other Ambulatory Visit: Payer: Self-pay

## 2020-12-25 DIAGNOSIS — Z431 Encounter for attention to gastrostomy: Secondary | ICD-10-CM | POA: Insufficient documentation

## 2020-12-25 DIAGNOSIS — Z7982 Long term (current) use of aspirin: Secondary | ICD-10-CM | POA: Diagnosis not present

## 2020-12-25 DIAGNOSIS — C01 Malignant neoplasm of base of tongue: Secondary | ICD-10-CM | POA: Diagnosis not present

## 2020-12-25 DIAGNOSIS — Z9221 Personal history of antineoplastic chemotherapy: Secondary | ICD-10-CM | POA: Diagnosis not present

## 2020-12-25 DIAGNOSIS — Z79899 Other long term (current) drug therapy: Secondary | ICD-10-CM | POA: Diagnosis not present

## 2020-12-25 DIAGNOSIS — Z452 Encounter for adjustment and management of vascular access device: Secondary | ICD-10-CM | POA: Diagnosis not present

## 2020-12-25 HISTORY — PX: IR REMOVAL TUN ACCESS W/ PORT W/O FL MOD SED: IMG2290

## 2020-12-25 HISTORY — PX: IR GASTROSTOMY TUBE REMOVAL: IMG5492

## 2020-12-25 LAB — CBC WITH DIFFERENTIAL/PLATELET
Abs Immature Granulocytes: 0.08 10*3/uL — ABNORMAL HIGH (ref 0.00–0.07)
Basophils Absolute: 0 10*3/uL (ref 0.0–0.1)
Basophils Relative: 1 %
Eosinophils Absolute: 0.1 10*3/uL (ref 0.0–0.5)
Eosinophils Relative: 3 %
HCT: 41 % (ref 39.0–52.0)
Hemoglobin: 14.1 g/dL (ref 13.0–17.0)
Immature Granulocytes: 2 %
Lymphocytes Relative: 23 %
Lymphs Abs: 0.9 10*3/uL (ref 0.7–4.0)
MCH: 33.1 pg (ref 26.0–34.0)
MCHC: 34.4 g/dL (ref 30.0–36.0)
MCV: 96.2 fL (ref 80.0–100.0)
Monocytes Absolute: 0.5 10*3/uL (ref 0.1–1.0)
Monocytes Relative: 11 %
Neutro Abs: 2.5 10*3/uL (ref 1.7–7.7)
Neutrophils Relative %: 60 %
Platelets: 175 10*3/uL (ref 150–400)
RBC: 4.26 MIL/uL (ref 4.22–5.81)
RDW: 12.7 % (ref 11.5–15.5)
WBC: 4.1 10*3/uL (ref 4.0–10.5)
nRBC: 0 % (ref 0.0–0.2)

## 2020-12-25 LAB — PROTIME-INR
INR: 0.9 (ref 0.8–1.2)
Prothrombin Time: 12 seconds (ref 11.4–15.2)

## 2020-12-25 MED ORDER — LIDOCAINE-EPINEPHRINE 1 %-1:100000 IJ SOLN
INTRAMUSCULAR | Status: AC
  Filled 2020-12-25: qty 1

## 2020-12-25 MED ORDER — LIDOCAINE VISCOUS HCL 2 % MT SOLN
OROMUCOSAL | Status: AC | PRN
  Administered 2020-12-25: 5 mL

## 2020-12-25 MED ORDER — FENTANYL CITRATE (PF) 100 MCG/2ML IJ SOLN
INTRAMUSCULAR | Status: AC
  Filled 2020-12-25: qty 2

## 2020-12-25 MED ORDER — LIDOCAINE HCL 1 % IJ SOLN
INTRAMUSCULAR | Status: AC
  Filled 2020-12-25: qty 20

## 2020-12-25 MED ORDER — LIDOCAINE VISCOUS HCL 2 % MT SOLN
OROMUCOSAL | Status: AC
  Filled 2020-12-25: qty 15

## 2020-12-25 MED ORDER — MIDAZOLAM HCL 2 MG/2ML IJ SOLN
INTRAMUSCULAR | Status: AC | PRN
  Administered 2020-12-25 (×2): 1 mg via INTRAVENOUS

## 2020-12-25 MED ORDER — MIDAZOLAM HCL 2 MG/2ML IJ SOLN
INTRAMUSCULAR | Status: AC
  Filled 2020-12-25: qty 2

## 2020-12-25 MED ORDER — SODIUM CHLORIDE 0.9 % IV SOLN: INTRAVENOUS | Status: DC

## 2020-12-25 MED ORDER — FENTANYL CITRATE (PF) 100 MCG/2ML IJ SOLN
INTRAMUSCULAR | Status: AC | PRN
  Administered 2020-12-25 (×3): 50 ug via INTRAVENOUS

## 2020-12-25 MED ORDER — LIDOCAINE-EPINEPHRINE 1 %-1:100000 IJ SOLN
INTRAMUSCULAR | Status: AC | PRN
  Administered 2020-12-25: 10 mL via INTRADERMAL

## 2020-12-25 NOTE — Discharge Instructions (Signed)
Interventional radiology phone numbers [phone] After hours [phone]   PEG Tube Removal, Care After This sheet gives you information about how to care for yourself after your procedure. Your health care provider may also give you more specific instructions. If you have problems or questions, contact your health care provider. What can I expect after the procedure? After the procedure, it is common to have:  Mild discomfort at the opening in your skin where the tube was removed (tube removal site).  A small amount of drainage from the opening. Follow these instructions at home: Care of the tube removal site  Follow instructions from your health care provider about how to take care of your tube removal site. Make sure you: ? Wash your hands with soap and water for at least 20 seconds before and after you change your bandage (dressing). If soap and water are not available, use hand sanitizer. ? You may remove your dressing tomorrow and shower. No baths.  Check your tube removal site every day for signs of infection. Check for: ? Redness, swelling, or pain. ? Fluid or blood. ? Warmth. ? Pus or a bad smell.  Do not take baths, swim, or use a hot tub until your health care provider approves. May shower tomorrow. Activity  Return to your normal activities as told by your health care provider. Ask your health care provider what activities are safe for you.  Do not lift anything that is heavier than 10 lb (4.5 kg), or the limit that you are told, until your health care provider says that it is safe.   General instructions  Take over-the-counter and prescription medicines only as told by your health care provider.  Follow instructions from your health care provider about eating and drinking.  Keep all follow-up visits. This is important. Contact a health care provider if:  You have a fever.  You have pain in your abdomen.  You have redness, swelling, or pain around your  tube removal site.  You have fluid or blood coming from your tube removal site.  Your tube removal site feels warm to the touch.  You have pus or a bad smell coming from your tube removal site.  You have nausea or vomiting. Get help right away if:  You have persistent bleeding from your tube removal site.  You have severe pain in your abdomen.  You are not able to eat or drink anything by mouth. Summary  Follow instructions from your health care provider about how to take care of your tube removal site.  Do not take baths, swim, or use a hot tub until your health care provider approves. Ask your health care provider if you may take showers. You may only be allowed to take sponge baths.  Check your tube removal site every day for signs of infection.  Return to your normal activities as told by your health care provider. This information is not intended to replace advice given to you by your health care provider. Make sure you discuss any questions you have with your health care provider. Document Revised: 01/28/2020 Document Reviewed: 01/28/2020 Elsevier Patient Education  2021 [place] Removal, Care After This sheet gives you information about how to care for yourself after your procedure. Your health care provider may also give you more specific instructions. If you have problems or questions, contact your health care provider. What can I expect after the procedure? After the procedure, it is common to have:  Soreness  or pain near your incision.  Some swelling or bruising near your incision. Follow these instructions at home: Medicines  Take over-the-counter and prescription medicines only as told by your health care provider. Bathing  Do not take baths, swim, or use a hot tub until your health care provider approves. You may shower tomorrow around 12 noon. Incision care  Follow instructions from your health care provider about how to take care  of your incision. Make sure you: ? Wash your hands with soap and water before you change your bandage (dressing). If soap and water are not available, use hand sanitizer. ? Change your dressing as told by your health care provider. ? Keep your dressing dry. ? Leave  skin glue in place. These skin closures may need to stay in place for 2 weeks or longer.   Check your incision area every day for signs of infection. Check for: ? More redness, swelling, or pain. ? More fluid or blood. ? Warmth. ? Pus or a bad smell.   Driving  Do not drive for 24 hours if you were given a medicine to help you relax (sedative) during your procedure.  If you did not receive a sedative, ask your health care provider when it is safe to drive.   Activity  Return to your normal activities as told by your health care provider. Ask your health care provider what activities are safe for you.  Do not lift anything that is heavier than 10 lb (4.5 kg), or the limit that you are told, until your health care provider says that it is safe.  Do not do activities that involve lifting your arms over your head. General instructions  Do not use any products that contain nicotine or tobacco, such as cigarettes and e-cigarettes. These can delay healing. If you need help quitting, ask your health care provider.  Keep all follow-up visits as told by your health care provider. This is important. Contact a health care provider if:  You have more redness, swelling, or pain around your incision.  You have more fluid or blood coming from your incision.  Your incision feels warm to the touch.  You have pus or a bad smell coming from your incision.  You have pain that is not relieved by your pain medicine. Get help right away if you have:  A fever or chills.  Chest pain.  Difficulty breathing. Summary  After the procedure, it is common to have pain, soreness, swelling, or bruising near your incision.  If you were  prescribed an antibiotic medicine, take it as told by your health care provider. Do not stop taking the antibiotic even if you start to feel better.  Do not drive for 24 hours if you were given a sedative during your procedure.  Return to your normal activities as told by your health care provider. Ask your health care provider what activities are safe for you. This information is not intended to replace advice given to you by your health care provider. Make sure you discuss any questions you have with your health care provider. Document Revised: 10/30/2017 Document Reviewed: 10/30/2017 Elsevier Patient Education  2021 [place].     Moderate Conscious Sedation, Adult, Care After This sheet gives you information about how to care for yourself after your procedure. Your health care provider may also give you more specific instructions. If you have problems or questions, contact your health care provider. What can I expect after the procedure? After the procedure, it  is common to have:  Sleepiness for several hours.  Impaired judgment for several hours.  Difficulty with balance.  Vomiting if you eat too soon. Follow these instructions at home: For the time period you were told by your health care provider:  Rest.  Do not participate in activities where you could fall or become injured.  Do not drive or use machinery.  Do not drink alcohol.  Do not take sleeping pills or medicines that cause drowsiness.  Do not make important decisions or sign legal documents.  Do not take care of children on your own.      Eating and drinking  Follow the diet recommended by your health care provider.  Drink enough fluid to keep your urine pale yellow.  If you vomit: ? Drink water, juice, or soup when you can drink without vomiting. ? Make sure you have little or no nausea before eating solid foods.   General instructions  Take over-the-counter and prescription medicines only as  told by your health care provider.  Have a responsible adult stay with you for the time you are told. It is important to have someone help care for you until you are awake and alert.  Do not smoke.  Keep all follow-up visits as told by your health care provider. This is important. Contact a health care provider if:  You are still sleepy or having trouble with balance after 24 hours.  You feel light-headed.  You keep feeling nauseous or you keep vomiting.  You develop a rash.  You have a fever.  You have redness or swelling around the IV site. Get help right away if:  You have trouble breathing.  You have new-onset confusion at home. Summary  After the procedure, it is common to feel sleepy, have impaired judgment, or feel nauseous if you eat too soon.  Rest after you get home. Know the things you should not do after the procedure.  Follow the diet recommended by your health care provider and drink enough fluid to keep your urine pale yellow.  Get help right away if you have trouble breathing or new-onset confusion at home. This information is not intended to replace advice given to you by your health care provider. Make sure you discuss any questions you have with your health care provider. Document Revised: 01/14/2020 Document Reviewed: 08/12/2019 Elsevier Patient Education  2021 [employer].

## 2020-12-25 NOTE — H&P (Addendum)
Referring Physician(s): Dorsey,John T IV  Supervising Physician: Sandi Mariscal  Patient Status:  [facility]  Chief Complaint:  "I'm getting my port a cath and stomach tube out"  Subjective: Pt familiar to IR service from Port-A-Cath and gastrostomy tube placements on 06/26/2020.  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  He is no longer using his Port-A-Cath and is eating a regular diet.  He presents today for both Port-A-Cath and gastrostomy tube removals.  He currently denies fever, headache, chest pain, dyspnea, cough, abdominal/back pain, nausea, vomiting or bleeding.  Past Medical History:  Diagnosis Date  . Hyperlipidemia   . Paroxysmal atrial fibrillation [facility])    Past Surgical History:  Procedure Laterality Date  . DIRECT LARYNGOSCOPY N/A 05/22/2020   Procedure: DIRECT LARYNGOSCOPY w/BIOPSY TONGUE BASE;  Surgeon: Izora Gala, MD;  Location: [facility];  Service: ENT;  Laterality: N/A;  . IR GASTROSTOMY TUBE MOD SED  06/26/2020  . IR IMAGING GUIDED PORT INSERTION  06/26/2020  . TOTAL HIP ARTHROPLASTY  2012   right hip-Dr. Berenice Primas      Allergies: Patient has no known allergies.  Medications: Prior to Admission medications   Medication Sig Start Date End Date Taking? Authorizing Provider  amoxicillin (AMOXIL) 500 MG capsule Take four capsules one hour before dental appointment. Patient not taking: Reported on 11/28/2020 06/12/20   Lenn Cal, DDS  aspirin 81 MG chewable tablet Chew by mouth daily.  Patient not taking: Reported on 11/28/2020    [provider]  atorvastatin (LIPITOR) 10 MG tablet Take 10 mg by mouth daily.    [provider]  Cholecalciferol (VITAMIN D3 SUPER STRENGTH) 50 MCG (2000 UT) TABS Take 2,000 Units by mouth.    [provider]  cyanocobalamin 1000 MCG tablet Take 1,000 mcg by mouth daily.    [provider]  folic acid (FOLVITE) 478  MCG tablet Take 400 mcg by mouth daily.    [provider]  lidocaine-prilocaine (EMLA) cream Apply 1 application topically as needed. Patient not taking: Reported on 11/28/2020 06/15/20   Brunetta Genera, MD  Misc Natural Products (CHROMIUM PICOLINATE FORTIFIED PO) Take by mouth.    [provider]  Nutritional Supplements (KATE FARMS STANDARD 1.4) LIQD 1 Container by Enteral route 5 (five) times daily. 07/24/20 01/22/21  Eppie Gibson, MD  ondansetron (ZOFRAN) 8 MG tablet Take 1 tablet (8 mg total) by mouth every 8 (eight) hours as needed for nausea or vomiting. Patient not taking: No sig reported 07/26/20   Orson Slick, MD  prochlorperazine (COMPAZINE) 10 MG tablet Take 1 tablet (10 mg total) by mouth every 6 (six) hours as needed for nausea or vomiting. Patient not taking: No sig reported 08/14/20   Orson Slick, MD  Sennosides (SENNA) 8.8 MG/5ML LIQD Take 10 mLs by mouth 2 (two) times daily as needed. Patient not taking: Reported on 11/28/2020 08/07/20   Ledell Peoples IV, MD  sodium fluoride (PREVIDENT 5000 PLUS) 1.1 % CREA dental cream Apply to tooth brush. Brush teeth for 2 minutes. Spit out excess-DO NOT swallow. Repeat nightly. 06/12/20   Lenn Cal, DDS     Vital Signs: BP (!) 142/84   Pulse (!) 55   Temp 98.1 F (36.7 C) (Oral)   Resp 18   SpO2 99%   Physical Exam awake, alert.  Chest with distant breath sounds bilaterally.  Clean, intact  right chest wall Port-A-Cath.  Heart with bradycardic but regular rhythm.  Abdomen soft, positive bowel sounds, intact gastrostomy tube, site nontender.  No lower extremity edema.  Imaging: No results found.  Labs:  CBC: Recent Labs    09/15/20 1452 10/19/20 1509 11/10/20 1009 12/08/20 1031  WBC 5.4 4.1 4.1 4.7  HGB 11.5* 11.9* 11.9* 13.1  HCT 32.9* 33.9* 33.8* 38.7*  PLT 229 163 160 166    COAGS: Recent Labs    06/26/20 1000  INR 0.9    BMP: Recent Labs    06/12/20 1347  06/26/20 1000 07/03/20 0745 07/10/20 0838 09/15/20 1452 10/19/20 1509 11/10/20 1009 12/08/20 1031  NA 140 139 136   < > 130* 132* 133* 134*  K 4.4 4.5 4.1   < > 4.5 4.2 4.5 4.3  CL 106 104 104   < > 95* 97* 102 102  CO2 26 26 26    < > 28 25 25 27   GLUCOSE 99 100* 94   < > 100* 82 139* 82  BUN 16 15 13    < > 19 17 12 11   CALCIUM 9.5 8.9 9.5   < > 9.4 9.1 9.0 9.2  CREATININE 0.87 0.89 0.80   < > 0.78 0.71 0.73 0.80  GFRNONAA >60 >60 >60   < > >60 >60 >60 >60  GFRAA >60 >60 >60  --   --   --   --   --    < > = values in this interval not displayed.    LIVER FUNCTION TESTS: Recent Labs    09/15/20 1452 10/19/20 1509 11/10/20 1009 12/08/20 1031  BILITOT 0.4 0.4 0.4 0.4  AST 16 16 18 17   ALT 14 13 22 17   ALKPHOS 78 68 74 85  PROT 6.1* 6.1* 6.0* 6.0*  ALBUMIN 3.4* 3.6 3.3* 3.5    Assessment and Plan: Pt familiar to IR service from Port-A-Cath and gastrostomy tube placements on 06/26/2020.  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  He is no longer using his Port-A-Cath and is eating a regular diet.  He presents today for both Port-A-Cath and gastrostomy tube removals.  Details/risks of procedures, including but not limited to, internal bleeding, infection, injury to adjacent structures discussed with patient with his understanding and consent.   Electronically Signed: D. Rowe Quamel, PA-C 12/25/2020, 10:05 AM   I spent a total of 20 minutes at the the patient's bedside AND on the patient's hospital floor or unit, greater than 50% of which was counseling/coordinating care for Port-A-Cath and gastrostomy tube removals

## 2020-12-25 NOTE — Procedures (Signed)
Pre Procedural Dx: Completed chemotherapy; No longer in need of feeding G-tube. Post Procedural Dx: Same  Successful removal of anterior chest wall port-a-cath. Successful removal of gastrostomy tube.  EBL: Minimal No immediate post procedural complications.   Ronny Bacon, MD Pager #: [phone]

## 2020-12-28 DIAGNOSIS — M5416 Radiculopathy, lumbar region: Secondary | ICD-10-CM | POA: Diagnosis not present

## 2021-01-01 DIAGNOSIS — M5416 Radiculopathy, lumbar region: Secondary | ICD-10-CM | POA: Diagnosis not present

## 2021-01-03 ENCOUNTER — Other Ambulatory Visit: Payer: Self-pay | Admitting: Internal Medicine

## 2021-01-03 DIAGNOSIS — I83029 Varicose veins of left lower extremity with ulcer of unspecified site: Secondary | ICD-10-CM

## 2021-01-03 DIAGNOSIS — G629 Polyneuropathy, unspecified: Secondary | ICD-10-CM | POA: Diagnosis not present

## 2021-01-03 DIAGNOSIS — M79606 Pain in leg, unspecified: Secondary | ICD-10-CM | POA: Diagnosis not present

## 2021-01-03 DIAGNOSIS — M79605 Pain in left leg: Secondary | ICD-10-CM

## 2021-01-03 DIAGNOSIS — I8393 Asymptomatic varicose veins of bilateral lower extremities: Secondary | ICD-10-CM | POA: Diagnosis not present

## 2021-01-04 ENCOUNTER — Other Ambulatory Visit: Payer: Self-pay | Admitting: Internal Medicine

## 2021-01-04 DIAGNOSIS — I83029 Varicose veins of left lower extremity with ulcer of unspecified site: Secondary | ICD-10-CM

## 2021-01-04 DIAGNOSIS — M79606 Pain in leg, unspecified: Secondary | ICD-10-CM

## 2021-01-04 DIAGNOSIS — L97929 Non-pressure chronic ulcer of unspecified part of left lower leg with unspecified severity: Secondary | ICD-10-CM

## 2021-01-05 ENCOUNTER — Other Ambulatory Visit: Payer: Self-pay

## 2021-01-05 ENCOUNTER — Inpatient Hospital Stay: Payer: Medicare Other

## 2021-01-05 ENCOUNTER — Other Ambulatory Visit: Payer: Self-pay | Admitting: Hematology and Oncology

## 2021-01-05 ENCOUNTER — Encounter: Payer: Self-pay | Admitting: Hematology and Oncology

## 2021-01-05 ENCOUNTER — Inpatient Hospital Stay: Payer: Medicare Other | Attending: Hematology and Oncology | Admitting: Hematology and Oncology

## 2021-01-05 VITALS — BP 136/93 | HR 57 | Temp 96.7°F | Resp 18 | Wt 162.8 lb

## 2021-01-05 DIAGNOSIS — Z923 Personal history of irradiation: Secondary | ICD-10-CM | POA: Insufficient documentation

## 2021-01-05 DIAGNOSIS — M5416 Radiculopathy, lumbar region: Secondary | ICD-10-CM | POA: Diagnosis not present

## 2021-01-05 DIAGNOSIS — Z08 Encounter for follow-up examination after completed treatment for malignant neoplasm: Secondary | ICD-10-CM | POA: Diagnosis not present

## 2021-01-05 DIAGNOSIS — Z85810 Personal history of malignant neoplasm of tongue: Secondary | ICD-10-CM | POA: Diagnosis not present

## 2021-01-05 DIAGNOSIS — C01 Malignant neoplasm of base of tongue: Secondary | ICD-10-CM

## 2021-01-05 DIAGNOSIS — Z9221 Personal history of antineoplastic chemotherapy: Secondary | ICD-10-CM | POA: Insufficient documentation

## 2021-01-05 LAB — CBC WITH DIFFERENTIAL (CANCER CENTER ONLY)
Abs Immature Granulocytes: 0.03 10*3/uL (ref 0.00–0.07)
Basophils Absolute: 0 10*3/uL (ref 0.0–0.1)
Basophils Relative: 1 %
Eosinophils Absolute: 0.1 10*3/uL (ref 0.0–0.5)
Eosinophils Relative: 2 %
HCT: 38.3 % — ABNORMAL LOW (ref 39.0–52.0)
Hemoglobin: 12.9 g/dL — ABNORMAL LOW (ref 13.0–17.0)
Immature Granulocytes: 1 %
Lymphocytes Relative: 26 %
Lymphs Abs: 1 10*3/uL (ref 0.7–4.0)
MCH: 32.3 pg (ref 26.0–34.0)
MCHC: 33.7 g/dL (ref 30.0–36.0)
MCV: 95.8 fL (ref 80.0–100.0)
Monocytes Absolute: 0.5 10*3/uL (ref 0.1–1.0)
Monocytes Relative: 13 %
Neutro Abs: 2.2 10*3/uL (ref 1.7–7.7)
Neutrophils Relative %: 57 %
Platelet Count: 186 10*3/uL (ref 150–400)
RBC: 4 MIL/uL — ABNORMAL LOW (ref 4.22–5.81)
RDW: 12.5 % (ref 11.5–15.5)
WBC Count: 3.8 10*3/uL — ABNORMAL LOW (ref 4.0–10.5)
nRBC: 0 % (ref 0.0–0.2)

## 2021-01-05 LAB — CMP (CANCER CENTER ONLY)
ALT: 17 U/L (ref 0–44)
AST: 19 U/L (ref 15–41)
Albumin: 3.7 g/dL (ref 3.5–5.0)
Alkaline Phosphatase: 85 U/L (ref 38–126)
Anion gap: 11 (ref 5–15)
BUN: 11 mg/dL (ref 8–23)
CO2: 26 mmol/L (ref 22–32)
Calcium: 9.4 mg/dL (ref 8.9–10.3)
Chloride: 102 mmol/L (ref 98–111)
Creatinine: 0.83 mg/dL (ref 0.61–1.24)
GFR, Estimated: >60 mL/min (ref >60)
Glucose, Bld: 102 mg/dL — ABNORMAL HIGH (ref 70–99)
Potassium: 4.4 mmol/L (ref 3.5–5.1)
Sodium: 139 mmol/L (ref 135–145)
Total Bilirubin: 0.5 mg/dL (ref 0.3–1.2)
Total Protein: 6.4 g/dL — ABNORMAL LOW (ref 6.5–8.1)

## 2021-01-05 LAB — TSH: TSH: 1.749 u[IU]/mL (ref 0.320–4.118)

## 2021-01-05 NOTE — Progress Notes (Signed)
[place] [phone]   [phone]  PROGRESS NOTE  Patient Care Team: Wenda Low, MD as PCP - General (Internal Medicine) Malmfelt, Stephani Police, RN as Oncology Nurse Navigator Eppie Gibson, MD as Consulting Physician (Radiation Oncology) Orson Slick, MD as Consulting Physician (Hematology and Oncology) Sharen Counter, CCC-SLP as Speech Language Pathologist (Speech Pathology) Wynelle Beckmann, Melodie Bouillon, PT as Physical Therapist (Physical Therapy) Beverely Pace, LCSW as Social Worker (General Practice) Karie Mainland, RD as Dietitian (Nutrition) Izora Gala, MD as Consulting Physician (Otolaryngology)  Hematological/Oncological History 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 Mass biopsied at the right base of the tongue. Biopsy showed invasive moderately differentiated squamous cell carcinoma 3) 06/06/2020: PET CT scan showed hypermetabolic right tongue base mass consistent with known neoplasm. Right level 2 and level 3 metastatic adenopathy. No evidence of metastatic disease.  4) 06/09/2020: established care with Dr. Isidore Moos 5) 06/12/2020: establish care with Dr. Lorenso Courier  6) 07/03/2020: Week 1 of Cisplatin chemoradiation 7) 07/10/2020: Week 2 of Cisplatin chemoradiation 8) 07/17/2020: Week 3 of Cisplatin chemoradiation 9) 07/24/2020: Week 4 of Cisplatin chemoradiation 10) 07/31/2020: Week 5 of Cisplatin chemoradiation 11) 08/07/2020: Week 6 of Cisplatin chemoradiation 12) 08/14/2020: Week 7 of Cisplatin chemoradiation 13) 08/18/2020: end of radiation therapy.  14) 11/24/2020: PET CT scan shows a complete remission, no FDG avid lesions or concern for residual/recurrent disease.   Interval  History:  Abdon Petrosky 70 y.o. male with medical history significant for SCC of the base of the tongue who presents for a follow up visit. The patient's last visit was on 12/08/2020. In the interim since the last visit he had his PEG and port removed.   On exam today Mr. Tesler reports he has been well in the interim since our last visit.  He has had his port and PEG pulled and reports that those sites have healed up well.  He is unfortunately decreased in weight by about 3 pounds but notes that he did not eat today and has been moving around quite a lot.  He reports that his taste is still poor but he is still able to eat virtually all foods without complications or difficulty.  He reports that his energy is good and he remains physically active.  Otherwise he has no questions comments or concerns today.  He is also not been having any issues with nausea, vomiting, or diarrhea.  His bowels have been moving regularly.  A full 10 point ROS is listed below.  MEDICAL HISTORY:  Past Medical History:  Diagnosis Date  . Hyperlipidemia   . Paroxysmal atrial fibrillation ([place])     SURGICAL HISTORY: Past Surgical History:  Procedure Laterality Date  . DIRECT LARYNGOSCOPY N/A 05/22/2020   Procedure: DIRECT LARYNGOSCOPY w/BIOPSY TONGUE BASE;  Surgeon: Izora Gala, MD;  Location: [facility];  Service: ENT;  Laterality: N/A;  . IR GASTROSTOMY TUBE MOD SED  06/26/2020  . IR GASTROSTOMY TUBE REMOVAL  12/25/2020  . IR IMAGING GUIDED PORT INSERTION  06/26/2020  . IR REMOVAL TUN ACCESS W/ PORT W/O FL MOD SED  12/25/2020  . TOTAL HIP ARTHROPLASTY  2012   right hip-Dr. Berenice Primas    SOCIAL  HISTORY: Social History   Socioeconomic History  . Marital status: Widowed    Spouse name: Not on file  . Number of children: 1  . Years of education: Not on file  . Highest education level: Not on file  Occupational History  . Not on file  Tobacco Use  . Smoking status: Never Smoker  . Smokeless  tobacco: Never Used  Vaping Use  . Vaping Use: Never used  Substance and Sexual Activity  . Alcohol use: Yes  . Drug use: No  . Sexual activity: Yes  Other Topics Concern  . Not on file  Social History Narrative  . Not on file   Social Determinants of Health   Financial Resource Strain: Low Risk   . Difficulty of Paying Living Expenses: Not hard at all  Food Insecurity: No Food Insecurity  . Worried About Charity fundraiser in the Last Year: Never true  . Ran Out of Food in the Last Year: Never true  Transportation Needs: No Transportation Needs  . Lack of Transportation (Medical): No  . Lack of Transportation (Non-Medical): No  Physical Activity: Not on file  Stress: Not on file  Social Connections: Moderately Isolated  . Frequency of Communication with Friends and Family: More than three times a week  . Frequency of Social Gatherings with Friends and Family: More than three times a week  . Attends Religious Services: Never  . Active Member of Clubs or Organizations: No  . Attends Archivist Meetings: Not on file  . Marital Status: Married  Human resources officer Violence: Not on file    FAMILY HISTORY: Family History  Problem Relation Age of Onset  . Hypertension Mother   . Heart disease Mother   . Heart disease Brother     ALLERGIES:  has No Known Allergies.  MEDICATIONS:  Current Outpatient Medications  Medication Sig Dispense Refill  . amoxicillin (AMOXIL) 500 MG capsule Take four capsules one hour before dental appointment. (Patient not taking: No sig reported) 4 capsule 3  . atorvastatin (LIPITOR) 10 MG tablet Take 10 mg by mouth daily.    . Cholecalciferol (VITAMIN D3 SUPER STRENGTH) 50 MCG (2000 UT) TABS Take 2,000 Units by mouth.    . cyanocobalamin 1000 MCG tablet Take 1,000 mcg by mouth daily.    . folic acid (FOLVITE) 662 MCG tablet Take 400 mcg by mouth daily.    . Misc Natural Products (CHROMIUM PICOLINATE FORTIFIED PO) Take by mouth.    .  sodium fluoride (PREVIDENT 5000 PLUS) 1.1 % CREA dental cream APPLY TO TOOTH BRUSH. BRUSH TEETH FOR 2 MINUTES. SPIT OUT EXCESS (DO NOT SWALLOW) REPEAT NIGHTLY 102 g 99   No current facility-administered medications for this visit.    REVIEW OF SYSTEMS:   Constitutional: ( - ) fevers, ( - )  chills , ( - ) night sweats Eyes: ( - ) blurriness of vision, ( - ) double vision, ( - ) watery eyes Ears, nose, mouth, throat, and face: ( - ) mucositis, ( - ) sore throat Respiratory: ( - ) cough, ( - ) dyspnea, ( - ) wheezes Cardiovascular: ( - ) palpitation, ( - ) chest discomfort, ( - ) lower extremity swelling Gastrointestinal:  ( - ) nausea, ( - ) heartburn, ( - ) change in bowel habits Skin: ( - ) abnormal skin rashes Lymphatics: ( - ) new lymphadenopathy, ( - ) easy bruising Neurological: ( - ) numbness, ( - ) tingling, ( - )  new weaknesses Behavioral/Psych: ( - ) mood change, ( - ) new changes  All other systems were reviewed with the patient and are negative.  PHYSICAL EXAMINATION: ECOG PERFORMANCE STATUS: 0 - Asymptomatic  Vitals:   01/05/21 1125  BP: (!) 136/93  Pulse: (!) 57  Resp: 18  Temp: (!) 96.7 F (35.9 C)  SpO2: 100%   Filed Weights   01/05/21 1125  Weight: 162 lb 12.8 oz (73.8 kg)    GENERAL: well appearing elderly Caucasian male alert, no distress and comfortable SKIN:  Normal tugor, no more erythema around neck. No skin lesions noted. EYES: conjunctiva are pink and non-injected, sclera clear OROPHARYNX: considerably less erythema of the right back of throat. Thick saliva. No evidence of ulcer/infection.  LUNGS: clear to auscultation and percussion with normal breathing effort HEART: regular rate & rhythm and no murmurs and no lower extremity edema Musculoskeletal: no cyanosis of digits and no clubbing  PSYCH: alert & oriented x 3, fluent speech NEURO: no focal motor/sensory deficits  LABORATORY DATA:  I have reviewed the data as listed CBC Latest Ref Rng &  Units 01/05/2021 12/25/2020 12/08/2020  WBC 4.0 - 10.5 K/uL 3.8(L) 4.1 4.7  Hemoglobin 13.0 - 17.0 g/dL 12.9(L) 14.1 13.1  Hematocrit 39.0 - 52.0 % 38.3(L) 41.0 38.7(L)  Platelets 150 - 400 K/uL 186 175 166    CMP Latest Ref Rng & Units 12/08/2020 11/10/2020 10/19/2020  Glucose 70 - 99 mg/dL 82 139(H) 82  BUN 8 - 23 mg/dL 11 12 17   Creatinine 0.61 - 1.24 mg/dL 0.80 0.73 0.71  Sodium 135 - 145 mmol/L 134(L) 133(L) 132(L)  Potassium 3.5 - 5.1 mmol/L 4.3 4.5 4.2  Chloride 98 - 111 mmol/L 102 102 97(L)  CO2 22 - 32 mmol/L 27 25 25   Calcium 8.9 - 10.3 mg/dL 9.2 9.0 9.1  Total Protein 6.5 - 8.1 g/dL 6.0(L) 6.0(L) 6.1(L)  Total Bilirubin 0.3 - 1.2 mg/dL 0.4 0.4 0.4  Alkaline Phos 38 - 126 U/L 85 74 68  AST 15 - 41 U/L 17 18 16   ALT 0 - 44 U/L 17 22 13     RADIOGRAPHIC STUDIES: IR Removal Tun Access W/ Port W/O FL  Result Date: 12/25/2020 CLINICAL DATA:  History of squamous cell carcinoma involving the base of the tongue. Patient has completed chemotherapy and is no longer in need of port a catheter. Additionally, patient is tolerating a regular diet well and is no longer in need of gastrostomy tube Port a catheter and gastrostomy tube were placed by Dr. Earleen Newport on 06/26/2020 and functioned well throughout duration of usage. There is no clinical sign or concern of infection. EXAM: 1. REMOVAL OF IMPLANTED TUNNELED PORT-A-CATH 2. BEDSIDE GASTROSTOMY TUBE REMOVAL. MEDICATIONS: None. The antibiotic was administered within 1 hour prior to the start of the procedure. ANESTHESIA/SEDATION: Moderate (conscious) sedation was employed during this procedure. A total of Versed 2 mg and Fentanyl 150 mcg was administered intravenously. Moderate Sedation Time: 17 minutes. The patient's level of consciousness and vital signs were monitored continuously by radiology nursing throughout the procedure under my direct supervision. FLUOROSCOPY TIME:  None PROCEDURE: Informed written consent was obtained from the patient after a  discussion of the risk, benefits and alternatives to the procedure. The patient was positioned supine on the fluoroscopy table and the right chest Port-A-Cath site was prepped with chlorhexidine. A sterile gown and gloves were worn during the procedure. Local anesthesia was provided with 1% lidocaine with epinephrine. A timeout was performed prior to  the initiation of the procedure. An incision was made overlying the Port-A-Cath with a #15 scalpel. Utilizing sharp and blunt dissection, the Port-A-Cath was removed completely. The pocked was irrigated with sterile saline. Wound closure was performed with interrupted subcutaneous 2-0 Vicryl sutures, a running subcuticular 4-0 Vicryl, Dermabond and Steri-Strips. Dressings were applied. Attention was now paid towards gastrostomy tube removal. External tract of the gastrostomy tube was lubricated with viscous lidocaine. Using manual traction, the gastrostomy tube was removed intact. A dressing was applied. The patient tolerated the above procedures well without immediate post procedural complication. FINDINGS: Successful removal of gastrostomy tube and implanted Port-A-Cath without immediate post procedural complication. IMPRESSION: 1. Successful removal of implanted Port-A-Cath. 2. Successful bedside removal of pull-through gastrostomy tube. Electronically Signed   By: Sandi Mariscal M.D.   On: 12/25/2020 12:03   IR GASTROSTOMY TUBE REMOVAL  Result Date: 12/25/2020 CLINICAL DATA:  History of squamous cell carcinoma involving the base of the tongue. Patient has completed chemotherapy and is no longer in need of port a catheter. Additionally, patient is tolerating a regular diet well and is no longer in need of gastrostomy tube Port a catheter and gastrostomy tube were placed by Dr. Earleen Newport on 06/26/2020 and functioned well throughout duration of usage. There is no clinical sign or concern of infection. EXAM: 1. REMOVAL OF IMPLANTED TUNNELED PORT-A-CATH 2. BEDSIDE  GASTROSTOMY TUBE REMOVAL. MEDICATIONS: None. The antibiotic was administered within 1 hour prior to the start of the procedure. ANESTHESIA/SEDATION: Moderate (conscious) sedation was employed during this procedure. A total of Versed 2 mg and Fentanyl 150 mcg was administered intravenously. Moderate Sedation Time: 17 minutes. The patient's level of consciousness and vital signs were monitored continuously by radiology nursing throughout the procedure under my direct supervision. FLUOROSCOPY TIME:  None PROCEDURE: Informed written consent was obtained from the patient after a discussion of the risk, benefits and alternatives to the procedure. The patient was positioned supine on the fluoroscopy table and the right chest Port-A-Cath site was prepped with chlorhexidine. A sterile gown and gloves were worn during the procedure. Local anesthesia was provided with 1% lidocaine with epinephrine. A timeout was performed prior to the initiation of the procedure. An incision was made overlying the Port-A-Cath with a #15 scalpel. Utilizing sharp and blunt dissection, the Port-A-Cath was removed completely. The pocked was irrigated with sterile saline. Wound closure was performed with interrupted subcutaneous 2-0 Vicryl sutures, a running subcuticular 4-0 Vicryl, Dermabond and Steri-Strips. Dressings were applied. Attention was now paid towards gastrostomy tube removal. External tract of the gastrostomy tube was lubricated with viscous lidocaine. Using manual traction, the gastrostomy tube was removed intact. A dressing was applied. The patient tolerated the above procedures well without immediate post procedural complication. FINDINGS: Successful removal of gastrostomy tube and implanted Port-A-Cath without immediate post procedural complication. IMPRESSION: 1. Successful removal of implanted Port-A-Cath. 2. Successful bedside removal of pull-through gastrostomy tube. Electronically Signed   By: Sandi Mariscal M.D.   On:  12/25/2020 12:03    ASSESSMENT & PLAN Shamond Skelton 70 y.o. male with medical history significant for SCC of the base of the tongue who presents for a follow up visit.  Review the labs, review the records, discussion with the patient the findings are most consistent with a squamous cell carcinoma of the back base of the tongue.  This was a T1 N1 M0 p16 positive cancer which makes it stage I.  Overall the prognosis for this is quite well and there is an excellent  chance of durable remission after chemoradiation therapy with cisplatin.   On exam today Mr. Mapel is doing well status post removal of his port and PEG.  His PET CT scan showed a complete response.  He is in good spirits and improving steadily.  Overall he appears stable  Previously we discussed treatment moving forward and the expected side effects and symptoms.  Side effects include but are not limited to fatigue, anemia, kidney dysfunction, nausea, vomiting, diarrhea, throat pain, weight loss, and neuropathy.  Patient his wife were agreeable to proceeding with treatment.  We also discussed the medications to help with potential side effects including Zofran as needed, Compazine as needed, and EMLA cream.    The treatment of choice for this patient will be radiation with concurrent cisplatin 40 mg per metered squared weekly x7 doses.  We started this on 07/03/2020 and had weekly visits with the patient while he is receiving this treatment. He completed chemotherapy on 08/14/2020.   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 P16+, Stage I  --patient completed 7 weekly doses of cisplatin 40mg /m2 administered with radiation on 08/18/2020 --supportive care as noted below. --at each visit check CMP, CBC and periodic TSH  --PET CT scan on 11/24/2020 shows that the patient is in a complete remission. Proceed with fibro-optic exams with ENT/Rad Onc moving forward --RTC in 8 weeks time for continued monitoring.   #Pain  Control --prescribed liquid roxicet 88ml q 6H PRN. Not using. No longer required.   #Supportive Care --patient provided with zofran 8mg  q8H PRN and compazine 10mg  q6H PRN.  --PEG and Port removed --pain control as above --erythematous radiation rash resolved with topical treatments.   No orders of the defined types were placed in this encounter.  All questions were answered. The patient knows to call the clinic with any problems, questions or concerns.  A total of more than 30 minutes were spent on this encounter and over half of that time was spent on counseling and coordination of care as outlined above.   Ledell Peoples, MD Department of Hematology/Oncology [place] [facility] Phone: [phone] Pager: [phone] Email: [email or web address]  01/05/2021 11:35 AM

## 2021-01-08 ENCOUNTER — Ambulatory Visit
Admission: RE | Admit: 2021-01-08 | Discharge: 2021-01-08 | Disposition: A | Discharge status: Home / Self Care | Payer: Medicare Other | Source: Ambulatory Visit | Attending: Internal Medicine | Admitting: Internal Medicine

## 2021-01-08 ENCOUNTER — Telehealth: Payer: Self-pay | Admitting: Hematology and Oncology

## 2021-01-08 ENCOUNTER — Ambulatory Visit: Payer: Medicare Other

## 2021-01-08 DIAGNOSIS — M79606 Pain in leg, unspecified: Secondary | ICD-10-CM

## 2021-01-08 DIAGNOSIS — I83029 Varicose veins of left lower extremity with ulcer of unspecified site: Secondary | ICD-10-CM

## 2021-01-08 DIAGNOSIS — M79662 Pain in left lower leg: Secondary | ICD-10-CM | POA: Diagnosis not present

## 2021-01-08 DIAGNOSIS — L97929 Non-pressure chronic ulcer of unspecified part of left lower leg with unspecified severity: Secondary | ICD-10-CM

## 2021-01-08 NOTE — Telephone Encounter (Signed)
Scheduled per los. Called and spoke with patient. Confirmed appt 

## 2021-01-09 DIAGNOSIS — M5416 Radiculopathy, lumbar region: Secondary | ICD-10-CM | POA: Diagnosis not present

## 2021-01-16 DIAGNOSIS — M5416 Radiculopathy, lumbar region: Secondary | ICD-10-CM | POA: Diagnosis not present

## 2021-01-18 DIAGNOSIS — M5416 Radiculopathy, lumbar region: Secondary | ICD-10-CM | POA: Diagnosis not present

## 2021-01-25 DIAGNOSIS — M5416 Radiculopathy, lumbar region: Secondary | ICD-10-CM | POA: Diagnosis not present

## 2021-01-29 DIAGNOSIS — M5416 Radiculopathy, lumbar region: Secondary | ICD-10-CM | POA: Diagnosis not present

## 2021-02-06 DIAGNOSIS — M5416 Radiculopathy, lumbar region: Secondary | ICD-10-CM | POA: Diagnosis not present

## 2021-02-08 DIAGNOSIS — M5416 Radiculopathy, lumbar region: Secondary | ICD-10-CM | POA: Diagnosis not present

## 2021-02-15 DIAGNOSIS — E7800 Pure hypercholesterolemia, unspecified: Secondary | ICD-10-CM | POA: Diagnosis not present

## 2021-02-15 DIAGNOSIS — Z1389 Encounter for screening for other disorder: Secondary | ICD-10-CM | POA: Diagnosis not present

## 2021-02-15 DIAGNOSIS — G629 Polyneuropathy, unspecified: Secondary | ICD-10-CM | POA: Diagnosis not present

## 2021-02-15 DIAGNOSIS — I700 Atherosclerosis of aorta: Secondary | ICD-10-CM | POA: Diagnosis not present

## 2021-02-15 DIAGNOSIS — C029 Malignant neoplasm of tongue, unspecified: Secondary | ICD-10-CM | POA: Diagnosis not present

## 2021-02-15 DIAGNOSIS — M5416 Radiculopathy, lumbar region: Secondary | ICD-10-CM | POA: Diagnosis not present

## 2021-02-15 DIAGNOSIS — I712 Thoracic aortic aneurysm, without rupture: Secondary | ICD-10-CM | POA: Diagnosis not present

## 2021-02-15 DIAGNOSIS — Z0000 Encounter for general adult medical examination without abnormal findings: Secondary | ICD-10-CM | POA: Diagnosis not present

## 2021-02-22 DIAGNOSIS — M5416 Radiculopathy, lumbar region: Secondary | ICD-10-CM | POA: Diagnosis not present

## 2021-02-28 ENCOUNTER — Telehealth: Payer: Self-pay | Admitting: Hematology and Oncology

## 2021-02-28 NOTE — Telephone Encounter (Signed)
Called pt to r/s appts per 6/1 sch msg. No answer. Left msg for pt to call back to r/s.

## 2021-03-02 ENCOUNTER — Inpatient Hospital Stay: Payer: Medicare Other

## 2021-03-02 ENCOUNTER — Inpatient Hospital Stay: Payer: Medicare Other | Admitting: Hematology and Oncology

## 2021-03-06 ENCOUNTER — Telehealth: Payer: Self-pay | Admitting: Hematology and Oncology

## 2021-03-06 NOTE — Telephone Encounter (Signed)
Scheduled appointment per 06/07 sch msg. Left message. 

## 2021-03-22 ENCOUNTER — Inpatient Hospital Stay: Payer: Medicare Other | Admitting: Hematology and Oncology

## 2021-03-22 ENCOUNTER — Other Ambulatory Visit: Payer: Self-pay | Admitting: Internal Medicine

## 2021-03-22 ENCOUNTER — Inpatient Hospital Stay: Payer: Medicare Other | Attending: Hematology and Oncology

## 2021-03-22 ENCOUNTER — Other Ambulatory Visit: Payer: Self-pay

## 2021-03-22 ENCOUNTER — Other Ambulatory Visit: Payer: Self-pay | Admitting: Hematology and Oncology

## 2021-03-22 ENCOUNTER — Encounter: Payer: Self-pay | Admitting: Hematology and Oncology

## 2021-03-22 ENCOUNTER — Other Ambulatory Visit (HOSPITAL_COMMUNITY): Payer: Self-pay

## 2021-03-22 VITALS — BP 131/87 | HR 61 | Temp 97.4°F | Resp 19 | Ht 72.0 in | Wt 160.5 lb

## 2021-03-22 DIAGNOSIS — E785 Hyperlipidemia, unspecified: Secondary | ICD-10-CM | POA: Insufficient documentation

## 2021-03-22 DIAGNOSIS — I712 Thoracic aortic aneurysm, without rupture, unspecified: Secondary | ICD-10-CM

## 2021-03-22 DIAGNOSIS — C01 Malignant neoplasm of base of tongue: Secondary | ICD-10-CM | POA: Diagnosis not present

## 2021-03-22 DIAGNOSIS — I480 Paroxysmal atrial fibrillation: Secondary | ICD-10-CM | POA: Diagnosis not present

## 2021-03-22 DIAGNOSIS — Z79899 Other long term (current) drug therapy: Secondary | ICD-10-CM | POA: Insufficient documentation

## 2021-03-22 DIAGNOSIS — Z923 Personal history of irradiation: Secondary | ICD-10-CM | POA: Insufficient documentation

## 2021-03-22 DIAGNOSIS — Z85810 Personal history of malignant neoplasm of tongue: Secondary | ICD-10-CM | POA: Insufficient documentation

## 2021-03-22 LAB — CMP (CANCER CENTER ONLY)
ALT: 26 U/L (ref 0–44)
AST: 27 U/L (ref 15–41)
Albumin: 3.5 g/dL (ref 3.5–5.0)
Alkaline Phosphatase: 78 U/L (ref 38–126)
Anion gap: 9 (ref 5–15)
BUN: 15 mg/dL (ref 8–23)
CO2: 25 mmol/L (ref 22–32)
Calcium: 9.2 mg/dL (ref 8.9–10.3)
Chloride: 107 mmol/L (ref 98–111)
Creatinine: 0.85 mg/dL (ref 0.61–1.24)
GFR, Estimated: >60 mL/min (ref >60)
Glucose, Bld: 107 mg/dL — ABNORMAL HIGH (ref 70–99)
Potassium: 4.3 mmol/L (ref 3.5–5.1)
Sodium: 141 mmol/L (ref 135–145)
Total Bilirubin: 0.5 mg/dL (ref 0.3–1.2)
Total Protein: 5.7 g/dL — ABNORMAL LOW (ref 6.5–8.1)

## 2021-03-22 LAB — CBC WITH DIFFERENTIAL (CANCER CENTER ONLY)
Abs Immature Granulocytes: 0.02 10*3/uL (ref 0.00–0.07)
Basophils Absolute: 0 10*3/uL (ref 0.0–0.1)
Basophils Relative: 1 %
Eosinophils Absolute: 0.1 10*3/uL (ref 0.0–0.5)
Eosinophils Relative: 3 %
HCT: 38.7 % — ABNORMAL LOW (ref 39.0–52.0)
Hemoglobin: 13.2 g/dL (ref 13.0–17.0)
Immature Granulocytes: 1 %
Lymphocytes Relative: 31 %
Lymphs Abs: 1 10*3/uL (ref 0.7–4.0)
MCH: 32 pg (ref 26.0–34.0)
MCHC: 34.1 g/dL (ref 30.0–36.0)
MCV: 93.9 fL (ref 80.0–100.0)
Monocytes Absolute: 0.3 10*3/uL (ref 0.1–1.0)
Monocytes Relative: 9 %
Neutro Abs: 1.8 10*3/uL (ref 1.7–7.7)
Neutrophils Relative %: 55 %
Platelet Count: 161 10*3/uL (ref 150–400)
RBC: 4.12 MIL/uL — ABNORMAL LOW (ref 4.22–5.81)
RDW: 12.5 % (ref 11.5–15.5)
WBC Count: 3.3 10*3/uL — ABNORMAL LOW (ref 4.0–10.5)
nRBC: 0 % (ref 0.0–0.2)

## 2021-03-22 LAB — TSH: TSH: 3.173 u[IU]/mL (ref 0.320–4.118)

## 2021-03-22 MED FILL — Sodium Fluoride Cream 1.1%: DENTAL | 90 days supply | Qty: 102 | Fill #0 | Status: AC

## 2021-03-22 NOTE — Progress Notes (Signed)
[place] [phone]   [phone]  PROGRESS NOTE  Patient Care Team: Wenda Low, MD as PCP - General (Internal Medicine) Malmfelt, Stephani Police, RN as Oncology Nurse Navigator Eppie Gibson, MD as Consulting Physician (Radiation Oncology) Orson Slick, MD as Consulting Physician (Hematology and Oncology) Sharen Counter, CCC-SLP as Speech Language Pathologist (Speech Pathology) Wynelle Beckmann, Melodie Bouillon, PT as Physical Therapist (Physical Therapy) Beverely Pace, LCSW as Social Worker (General Practice) Karie Mainland, RD as Dietitian (Nutrition) Izora Gala, MD as Consulting Physician (Otolaryngology)  Hematological/Oncological History 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 Mass biopsied at the right base of the tongue. Biopsy showed invasive moderately differentiated squamous cell carcinoma 3) 06/06/2020: PET CT scan showed hypermetabolic right tongue base mass consistent with known neoplasm. Right level 2 and level 3 metastatic adenopathy. No evidence of metastatic disease.  4) 06/09/2020: established care with Dr. Isidore Moos 5) 06/12/2020: establish care with Dr. Lorenso Courier  6) 07/03/2020: Week 1 of Cisplatin chemoradiation 7) 07/10/2020: Week 2 of Cisplatin chemoradiation 8) 07/17/2020: Week 3 of Cisplatin chemoradiation 9) 07/24/2020: Week 4 of Cisplatin chemoradiation 10) 07/31/2020: Week 5 of Cisplatin chemoradiation 11) 08/07/2020: Week 6 of Cisplatin chemoradiation 12) 08/14/2020: Week 7 of Cisplatin chemoradiation 13) 08/18/2020: end of radiation therapy.  14) 11/24/2020: PET CT scan shows a complete remission, no FDG avid lesions or concern for residual/recurrent disease.   Interval  History:  Patrick Nelson 70 y.o. male with medical history significant for SCC of the base of the tongue who presents for a follow up visit. The patient's last visit was on 01/05/2021. In the interim since the last visit he has had no major changes in his health.  On exam today Patrick Nelson reports that he is doing well overall.  He reports that his weight is "stuck at 160".  He notes that he is doing his best to eat more food and reports that he is eating better every day.  He still reports that his taste "is not great".  He is now able to tolerate carbonated beverages and beer without burning sensation in his throat.  He notes he is not having any trouble swallowing and that his throat is good.  He does occasionally cough up some phlegm.  His exercise has been "excellent" and he continues to golf and perform outdoor activities.  Otherwise he has no questions comments or concerns today.  He is also not been having any issues with nausea, vomiting, or diarrhea.  His bowels have been moving regularly.  A full 10 point ROS is listed below.  MEDICAL HISTORY:  Past Medical History:  Diagnosis Date   Hyperlipidemia    Paroxysmal atrial fibrillation ([place])     SURGICAL HISTORY: Past Surgical History:  Procedure Laterality Date   DIRECT LARYNGOSCOPY N/A 05/22/2020   Procedure: DIRECT LARYNGOSCOPY w/BIOPSY TONGUE BASE;  Surgeon: Izora Gala, MD;  Location: [facility];  Service: ENT;  Laterality: N/A;   IR GASTROSTOMY TUBE MOD SED  06/26/2020   IR GASTROSTOMY TUBE REMOVAL  12/25/2020   IR IMAGING GUIDED PORT INSERTION  06/26/2020   IR REMOVAL TUN ACCESS W/ PORT W/O FL MOD SED  12/25/2020   TOTAL  HIP ARTHROPLASTY  2012   right hip-Dr. Berenice Primas    SOCIAL HISTORY: Social History   Socioeconomic History   Marital status: Widowed    Spouse name: Not on file   Number of children: 1   Years of education: Not on file   Highest education level: Not on file  Occupational History   Not on file   Tobacco Use   Smoking status: Never   Smokeless tobacco: Never  Vaping Use   Vaping Use: Never used  Substance and Sexual Activity   Alcohol use: Yes   Drug use: No   Sexual activity: Yes  Other Topics Concern   Not on file  Social History Narrative   Not on file   Social Determinants of Health   Financial Resource Strain: Low Risk    Difficulty of Paying Living Expenses: Not hard at all  Food Insecurity: No Food Insecurity   Worried About Charity fundraiser in the Last Year: Never true   [place] in the Last Year: Never true  Transportation Needs: No Transportation Needs   Lack of Transportation (Medical): No   Lack of Transportation (Non-Medical): No  Physical Activity: Not on file  Stress: Not on file  Social Connections: Moderately Isolated   Frequency of Communication with Friends and Family: More than three times a week   Frequency of Social Gatherings with Friends and Family: More than three times a week   Attends Religious Services: Never   Marine scientist or Organizations: No   Attends Music therapist: Not on file   Marital Status: Married  Human resources officer Violence: Not on file    FAMILY HISTORY: Family History  Problem Relation Age of Onset   Hypertension Mother    Heart disease Mother    Heart disease Brother     ALLERGIES:  has No Known Allergies.  MEDICATIONS:  Current Outpatient Medications  Medication Sig Dispense Refill   amoxicillin (AMOXIL) 500 MG capsule Take four capsules one hour before dental appointment. (Patient not taking: No sig reported) 4 capsule 3   atorvastatin (LIPITOR) 10 MG tablet Take 10 mg by mouth daily.     Cholecalciferol (VITAMIN D3 SUPER STRENGTH) 50 MCG (2000 UT) TABS Take 2,000 Units by mouth.     cyanocobalamin 1000 MCG tablet Take 1,000 mcg by mouth daily.     folic acid (FOLVITE) 161 MCG tablet Take 400 mcg by mouth daily.     Misc Natural Products (CHROMIUM PICOLINATE FORTIFIED PO) Take  by mouth.     sodium fluoride (PREVIDENT 5000 PLUS) 1.1 % CREA dental cream APPLY TO TOOTH BRUSH. BRUSH TEETH FOR 2 MINUTES. SPIT OUT EXCESS (DO NOT SWALLOW) REPEAT NIGHTLY 102 g 99   No current facility-administered medications for this visit.    REVIEW OF SYSTEMS:   Constitutional: ( - ) fevers, ( - )  chills , ( - ) night sweats Eyes: ( - ) blurriness of vision, ( - ) double vision, ( - ) watery eyes Ears, nose, mouth, throat, and face: ( - ) mucositis, ( - ) sore throat Respiratory: ( - ) cough, ( - ) dyspnea, ( - ) wheezes Cardiovascular: ( - ) palpitation, ( - ) chest discomfort, ( - ) lower extremity swelling Gastrointestinal:  ( - ) nausea, ( - ) heartburn, ( - ) change in bowel habits Skin: ( - ) abnormal skin rashes Lymphatics: ( - ) new lymphadenopathy, ( - ) easy bruising Neurological: ( - )  numbness, ( - ) tingling, ( - ) new weaknesses Behavioral/Psych: ( - ) mood change, ( - ) new changes  All other systems were reviewed with the patient and are negative.  PHYSICAL EXAMINATION: ECOG PERFORMANCE STATUS: 0 - Asymptomatic  Vitals:   03/22/21 0758  BP: 131/87  Pulse: 61  Resp: 19  Temp: (!) 97.4 F (36.3 C)  SpO2: 100%   Filed Weights   03/22/21 0758  Weight: 160 lb 8 oz (72.8 kg)    GENERAL: well appearing elderly Caucasian male alert, no distress and comfortable SKIN:  Normal tugor, no more erythema around neck. No skin lesions noted. EYES: conjunctiva are pink and non-injected, sclera clear OROPHARYNX: considerably less erythema of the right back of throat. Thick saliva. No evidence of ulcer/infection.  LUNGS: clear to auscultation and percussion with normal breathing effort HEART: regular rate & rhythm and no murmurs and no lower extremity edema Musculoskeletal: no cyanosis of digits and no clubbing  PSYCH: alert & oriented x 3, fluent speech NEURO: no focal motor/sensory deficits  LABORATORY DATA:  I have reviewed the data as listed CBC Latest Ref Rng &  Units 03/22/2021 01/05/2021 12/25/2020  WBC 4.0 - 10.5 K/uL 3.3(L) 3.8(L) 4.1  Hemoglobin 13.0 - 17.0 g/dL 13.2 12.9(L) 14.1  Hematocrit 39.0 - 52.0 % 38.7(L) 38.3(L) 41.0  Platelets 150 - 400 K/uL 161 186 175    CMP Latest Ref Rng & Units 03/22/2021 01/05/2021 12/08/2020  Glucose 70 - 99 mg/dL 107(H) 102(H) 82  BUN 8 - 23 mg/dL 15 11 11   Creatinine 0.61 - 1.24 mg/dL 0.85 0.83 0.80  Sodium 135 - 145 mmol/L 141 139 134(L)  Potassium 3.5 - 5.1 mmol/L 4.3 4.4 4.3  Chloride 98 - 111 mmol/L 107 102 102  CO2 22 - 32 mmol/L 25 26 27   Calcium 8.9 - 10.3 mg/dL 9.2 9.4 9.2  Total Protein 6.5 - 8.1 g/dL 5.7(L) 6.4(L) 6.0(L)  Total Bilirubin 0.3 - 1.2 mg/dL 0.5 0.5 0.4  Alkaline Phos 38 - 126 U/L 78 85 85  AST 15 - 41 U/L 27 19 17   ALT 0 - 44 U/L 26 17 17     RADIOGRAPHIC STUDIES: No results found.   ASSESSMENT & PLAN Patrick Nelson 70 y.o. male with medical history significant for SCC of the base of the tongue who presents for a follow up visit.  Review the labs, review the records, discussion with the patient the findings are most consistent with a squamous cell carcinoma of the back base of the tongue.  This was a T1 N1 M0 p16 positive cancer which makes it stage I.  Overall the prognosis for this is quite well and there is an excellent chance of durable remission after chemoradiation therapy with cisplatin.   Previously we discussed treatment moving forward and the expected side effects and symptoms.  Side effects include but are not limited to fatigue, anemia, kidney dysfunction, nausea, vomiting, diarrhea, throat pain, weight loss, and neuropathy.  Patient his wife were agreeable to proceeding with treatment.  We also discussed the medications to help with potential side effects including Zofran as needed, Compazine as needed, and EMLA cream.    The treatment of choice for this patient will be radiation with concurrent cisplatin 40 mg per metered squared weekly x7 doses.  We started this on 07/03/2020  and had weekly visits with the patient while he is receiving this treatment. He completed chemotherapy on 08/14/2020.   # Squamous Cell Carcinoma of the Right Base  of the Tongue. T1N1M0. P16+, Stage I  --patient completed 7 weekly doses of cisplatin 40mg /m2 administered with radiation on 08/18/2020 --supportive care as noted below. --at each visit check CMP, CBC and periodic TSH  --PET CT scan on 11/24/2020 shows that the patient is in a complete remission. Proceed with fibro-optic exams with ENT/Rad Onc moving forward. These have not been performed yet, will reach out to the H/N navigator to get these underweay.  --RTC in 12 weeks time for continued monitoring.   #Supportive Care --PEG and Port removed --erythematous radiation rash resolved with topical treatments.   No orders of the defined types were placed in this encounter.  All questions were answered. The patient knows to call the clinic with any problems, questions or concerns.  A total of more than 30 minutes were spent on this encounter and over half of that time was spent on counseling and coordination of care as outlined above.   Ledell Peoples, MD Department of Hematology/Oncology [place] at [facility] Phone: [phone] Pager: [phone] Email: [email or web address]  03/22/2021 10:06 AM

## 2021-03-23 ENCOUNTER — Other Ambulatory Visit (HOSPITAL_COMMUNITY): Payer: Self-pay

## 2021-03-23 ENCOUNTER — Ambulatory Visit: Payer: Medicare Other | Admitting: Hematology and Oncology

## 2021-03-23 ENCOUNTER — Other Ambulatory Visit: Payer: Medicare Other

## 2021-03-26 ENCOUNTER — Ambulatory Visit
Admission: RE | Admit: 2021-03-26 | Discharge: 2021-03-26 | Disposition: A | Discharge status: Home / Self Care | Payer: Medicare Other | Source: Ambulatory Visit | Attending: Internal Medicine | Admitting: Internal Medicine

## 2021-03-26 DIAGNOSIS — I712 Thoracic aortic aneurysm, without rupture, unspecified: Secondary | ICD-10-CM

## 2021-03-26 DIAGNOSIS — I700 Atherosclerosis of aorta: Secondary | ICD-10-CM | POA: Diagnosis not present

## 2021-03-26 DIAGNOSIS — J984 Other disorders of lung: Secondary | ICD-10-CM | POA: Diagnosis not present

## 2021-03-26 MED ORDER — IOPAMIDOL (ISOVUE-370) INJECTION 76%
75.0000 mL | Freq: Once | INTRAVENOUS | Status: AC | PRN
  Administered 2021-03-26: 75 mL via INTRAVENOUS

## 2021-03-26 NOTE — Progress Notes (Signed)
Oncology Nurse Navigator Documentation   I received a request from Dr. Lorenso Courier to get Patrick Nelson set up with his ENT MD for fibro-optic exam. I called Patrick Nelson to get an appointment set up and he verbalized that he would call Dr. Janeice Robinson office to schedule an appointment. I advised him to tell Dr. Janeice Robinson schedulers that he had completed his chemo/radiation treatment and needed to see him for a routine follow up. He voiced his understanding and told me he would call for an appointment.   Harlow Asa RN, BSN, OCN Head & Neck Oncology Nurse [place] at [facility] Phone # [phone]  Fax # [phone]

## 2021-04-09 DIAGNOSIS — H6122 Impacted cerumen, left ear: Secondary | ICD-10-CM | POA: Diagnosis not present

## 2021-04-26 DIAGNOSIS — C109 Malignant neoplasm of oropharynx, unspecified: Secondary | ICD-10-CM | POA: Diagnosis not present

## 2021-04-26 DIAGNOSIS — Z923 Personal history of irradiation: Secondary | ICD-10-CM | POA: Diagnosis not present

## 2021-05-07 DIAGNOSIS — L578 Other skin changes due to chronic exposure to nonionizing radiation: Secondary | ICD-10-CM | POA: Diagnosis not present

## 2021-05-07 DIAGNOSIS — L814 Other melanin hyperpigmentation: Secondary | ICD-10-CM | POA: Diagnosis not present

## 2021-05-07 DIAGNOSIS — D225 Melanocytic nevi of trunk: Secondary | ICD-10-CM | POA: Diagnosis not present

## 2021-05-07 DIAGNOSIS — D485 Neoplasm of uncertain behavior of skin: Secondary | ICD-10-CM | POA: Diagnosis not present

## 2021-05-07 DIAGNOSIS — L821 Other seborrheic keratosis: Secondary | ICD-10-CM | POA: Diagnosis not present

## 2021-05-07 DIAGNOSIS — L570 Actinic keratosis: Secondary | ICD-10-CM | POA: Diagnosis not present

## 2021-05-07 DIAGNOSIS — Z85828 Personal history of other malignant neoplasm of skin: Secondary | ICD-10-CM | POA: Diagnosis not present

## 2021-05-31 ENCOUNTER — Encounter: Payer: Self-pay | Admitting: Nurse Practitioner

## 2021-05-31 ENCOUNTER — Other Ambulatory Visit: Payer: Self-pay

## 2021-05-31 ENCOUNTER — Inpatient Hospital Stay: Payer: Medicare Other | Attending: Hematology and Oncology | Admitting: Nurse Practitioner

## 2021-05-31 VITALS — BP 126/81 | HR 67 | Temp 97.8°F | Resp 18 | Wt 157.4 lb

## 2021-05-31 DIAGNOSIS — Z9221 Personal history of antineoplastic chemotherapy: Secondary | ICD-10-CM | POA: Insufficient documentation

## 2021-05-31 DIAGNOSIS — Z85810 Personal history of malignant neoplasm of tongue: Secondary | ICD-10-CM | POA: Insufficient documentation

## 2021-05-31 DIAGNOSIS — Z79899 Other long term (current) drug therapy: Secondary | ICD-10-CM | POA: Diagnosis not present

## 2021-05-31 DIAGNOSIS — Z923 Personal history of irradiation: Secondary | ICD-10-CM | POA: Insufficient documentation

## 2021-05-31 DIAGNOSIS — C01 Malignant neoplasm of base of tongue: Secondary | ICD-10-CM | POA: Diagnosis not present

## 2021-05-31 NOTE — Progress Notes (Signed)
CLINIC:  Survivorship  Patient Care Team: Wenda Low, MD as PCP - General (Internal Medicine) Malmfelt, Stephani Police, RN as Oncology Nurse Navigator Eppie Gibson, MD as Consulting Physician (Radiation Oncology) Orson Slick, MD as Consulting Physician (Hematology and Oncology) Sharen Counter, CCC-SLP as Speech Language Pathologist (Speech Pathology) Wynelle Beckmann, Melodie Bouillon, PT as Physical Therapist (Physical Therapy) Beverely Pace, LCSW as Social Worker (General Practice) Karie Mainland, RD as Dietitian (Nutrition) Izora Gala, MD as Consulting Physician (Otolaryngology) Alla Feeling, NP as Nurse Practitioner (Nurse Practitioner)   REASON FOR VISIT:  Routine follow-up for history of head & neck cancer.  BRIEF ONCOLOGIC HISTORY:  Oncology History  Squamous cell carcinoma of base of tongue ([place])  05/22/2020 Initial Biopsy   Tongue, right base, biopsy -Invasive moderately differentiated squamous cell carcinoma   05/22/2020 Initial Diagnosis   Squamous cell carcinoma of base of tongue ([place])   06/06/2020 PET scan   IMPRESSION: 1. Hypermetabolic right tongue base mass consistent with known neoplasm. Right level 2 and level 3 metastatic adenopathy. 2. No findings for distant metastatic disease involving the chest, abdomen or pelvis. 3. Moderate FDG uptake in the right and left atrial walls most typically seen with atrial fibrillation. Recommend clinical correlation.   06/21/2020 Cancer Staging   Staging form: Pharynx - HPV-Mediated Oropharynx, AJCC 8th Edition - Clinical stage from 06/21/2020: Stage I (cT1, cN1, cM0, p16+) - Signed by Eppie Gibson, MD on 09/20/2020   07/03/2020 - 08/20/2021 Chemotherapy   Concurrent chemoradiation with weekly cisplatin per Dr. Lorenso Courier and Dr. Isidore Moos Total radiation dose 70 Gray in 35 fractions Completed 7 weekly cycles of cisplatin    11/25/2020 PET scan   IMPRESSION: 1. No residual evidence of head neck carcinoma in the  oropharynx. 2. No evidence of metastatic adenopathy in the neck. 3. No evidence of distant metastatic disease. 4. Mild esophagitis.      INTERVAL HISTORY:  Mr. Patrick Nelson presents for initial survivorship visit as scheduled.  He is doing well overall, some days better than others.  He is eating and drinking well but remains active so weight is hard to keep on. He has occasional dry mouth.  Using fluoride trays.  Denies other residual side effects from chemo RT.  He is up-to-date on vaccines.  Denies fever, chills, cough, chest pain, dyspnea, painful swallowing, neck mass/adenopathy, nutritional issues, or dental problems.   -Pain: None -Nutrition/Diet: Regular -Dysphagia?:  None -Dental issues?:  None using fluoride trays?  Yes -Last TSH: 03/22/2021, normal 3.173 -Weight: 157 today, 3 pounds weight loss since 03/22/2021   -Last ENT visit: 04/26/2021 with Dr. Izora Gala -Last Rad Onc visit: 11/28/2020 -Last med onc visit: 03/22/2021 -Last Dentist visit: Upcoming with Dr. Randol Kern   CURRENT MEDICATIONS:  Current Outpatient Medications on File Prior to Visit  Medication Sig Dispense Refill   atorvastatin (LIPITOR) 10 MG tablet Take 10 mg by mouth daily.     Cholecalciferol (VITAMIN D3 SUPER STRENGTH) 50 MCG (2000 UT) TABS Take 2,000 Units by mouth.     cyanocobalamin 1000 MCG tablet Take 1,000 mcg by mouth daily.     folic acid (FOLVITE) A999333 MCG tablet Take 400 mcg by mouth daily.     gabapentin (NEURONTIN) 300 MG capsule Take 300 mg by mouth at bedtime.     Misc Natural Products (CHROMIUM PICOLINATE FORTIFIED PO) Take by mouth.     sodium fluoride (PREVIDENT 5000 PLUS) 1.1 % CREA dental cream APPLY TO TOOTH BRUSH. BRUSH TEETH  FOR 2 MINUTES. SPIT OUT EXCESS (DO NOT SWALLOW) REPEAT NIGHTLY 102 g 99   amoxicillin (AMOXIL) 500 MG capsule Take four capsules one hour before dental appointment. (Patient not taking: No sig reported) 4 capsule 3   No current facility-administered medications on file  prior to visit.    ALLERGIES:  No Known Allergies   PHYSICAL EXAM:  Vitals:   05/31/21 1214  BP: 126/81  Pulse: 67  Resp: 18  Temp: 97.8 F (36.6 C)  SpO2: 99%   Filed Weights   05/31/21 1214  Weight: 157 lb 7 oz (71.4 kg)   General: Well-appearing male in no acute distress.   HEENT:  Sclerae anicteric. Oral mucosa is pink and moist without lesions.  Tongue pink, moist, and midline. Oropharynx is pink and moist, without lesions. Lymph: No preauricular, postauricular, cervical, supraclavicular, or infraclavicular lymphadenopathy noted on palpation.   Neck: No palpable masses. Skin on neck is intact.  Cardiovascular: Normal rate and rhythm. Respiratory: Clear; breathing non-labored.  Neuro: No focal deficits. Steady gait.   Psych: Normal mood and affect for situation. Extremities: No edema.  Skin: Warm and dry.    LABORATORY DATA:  None at this visit.  DIAGNOSTIC IMAGING:  None at this visit.    ASSESSMENT & PLAN:  Mr. Wind is a pleasant 70 y.o. male with history of stage I SCC right tongue base, diagnosed in 04/2020;  treated with concurrent chemoradiation with cisplatin; completed treatment on 08/20/2020.  Patient presents to survivorship clinic today for routine follow-up after finishing treatment.   1.  Stage I SCC right tongue base:  Mr. Hartsell has mostly recovered from chemoRT with Cisplatin completed in 07/2020. He is clinically doing well without clinical evidence of disease or recurrence on physical exam today.     2. Nutritional status: Mr. Terhaar reports that he is currently able to consume adequate nutrition by mouth, tube was removed 12/25/2020.  Weight is mostly stable at 157 lbs today.  His active nature makes it difficult to gain weight. Encouraged to continue to consume adequate hydration and nutrition, as tolerated.    3. At risk for dysphagia: Given Mr. Rueff treatment for cancer of the tongue base cancer, which included chemoradiation therapy with  cisplatin, he is at risk for chronic dysphagia.  If Mr. Chilson develops dysphagia or swallowing needs in the future, I will refer him to SLP.  Currently, the patient's reported swallowing concerns are to be expected and stable.    4.  At risk for neck lymphedema:  When patients with head & neck cancers are treated with surgery and/or radiation therapy, there is an associated increased risk of neck lymphedema.  Mr. Ridgell reports that currently he is not experiencing symptoms.   5.  At risk for hypothyroidism: The thyroid gland is often affected after treatment for head & neck cancer.  Mr. Ekberg most recent TSH was normal at 3.173 on 03/22/2021 on.  Next TSH in 6 months, we reviewed signs and symptoms of thyroid dysfunction.  6. At risk for tooth decay/dental concerns: After treatment with radiation for head & neck cancers, patients often experience xerostomia which increases their risk of dental caries. Mr. Abelar was encouraged to see his dentist 3-4 times per year. The patient should also continue to use his fluoride trays daily, as directed by dentistry.  Dentist is Dr. Randol Kern  7. Tobacco & alcohol use: Mr. Casserly is a non-smoker.  We discussed both tobacco and alcohol use in patients with head & neck  cancer increases the risk of recurrence.  They also increase the risk of other cancers, as well.  Mr. Florance states that he voiced understanding  8. Health maintenance and wellness promotion: I encouraged Mr. Holligan to consume a normal nutrient-rich diet, engage in moderate to vigorous exercise as tolerated, remain up-to-date on age-appropriate vaccines and cancer screenings.     9. Support services/counseling: It is not uncommon for this period of the patient's cancer care trajectory to be one of many emotions and stressors.  His treatment summary included information on Head & Neck FYNN ("Finding Your New Normal") support group series, designed for patients after they have completed treatment. The  patient was offered support today through active listening and expressive supportive counseling.     Dispo:  -See Dr. Constance Holster (ENT) in 06/2021 ?  Will ask H/N navigator to confirm -Return to cancer center to see Dr. Isidore Moos in 11/2021 -Med onc follow-up with Dr. Lorenso Courier in 6 months -Long term SCP visit with me in 1 year    A total of 30 minutes was spent in the face-to-face care of this patient, with greater than 50% of that time spent in counseling and care-coordination.    Cira Rue, NP [place] [phone]

## 2021-06-01 ENCOUNTER — Telehealth: Payer: Self-pay | Admitting: Nurse Practitioner

## 2021-06-01 NOTE — Telephone Encounter (Signed)
Scheduled follow-up appointments per 9/1 los. Patient is aware. 

## 2021-06-22 ENCOUNTER — Inpatient Hospital Stay: Payer: Medicare Other

## 2021-06-22 ENCOUNTER — Other Ambulatory Visit: Payer: Self-pay | Admitting: *Deleted

## 2021-06-22 ENCOUNTER — Other Ambulatory Visit: Payer: Self-pay

## 2021-06-22 ENCOUNTER — Inpatient Hospital Stay: Payer: Medicare Other | Admitting: Hematology and Oncology

## 2021-06-22 DIAGNOSIS — C01 Malignant neoplasm of base of tongue: Secondary | ICD-10-CM

## 2021-06-22 DIAGNOSIS — Z923 Personal history of irradiation: Secondary | ICD-10-CM | POA: Diagnosis not present

## 2021-06-22 DIAGNOSIS — Z9221 Personal history of antineoplastic chemotherapy: Secondary | ICD-10-CM | POA: Diagnosis not present

## 2021-06-22 DIAGNOSIS — Z79899 Other long term (current) drug therapy: Secondary | ICD-10-CM | POA: Diagnosis not present

## 2021-06-22 DIAGNOSIS — Z85810 Personal history of malignant neoplasm of tongue: Secondary | ICD-10-CM | POA: Diagnosis not present

## 2021-06-22 LAB — CMP (CANCER CENTER ONLY)
ALT: 16 U/L (ref 0–44)
AST: 21 U/L (ref 15–41)
Albumin: 3.9 g/dL (ref 3.5–5.0)
Alkaline Phosphatase: 87 U/L (ref 38–126)
Anion gap: 7 (ref 5–15)
BUN: 11 mg/dL (ref 8–23)
CO2: 27 mmol/L (ref 22–32)
Calcium: 10.1 mg/dL (ref 8.9–10.3)
Chloride: 103 mmol/L (ref 98–111)
Creatinine: 0.95 mg/dL (ref 0.61–1.24)
GFR, Estimated: >60 mL/min (ref >60)
Glucose, Bld: 107 mg/dL — ABNORMAL HIGH (ref 70–99)
Potassium: 4.5 mmol/L (ref 3.5–5.1)
Sodium: 137 mmol/L (ref 135–145)
Total Bilirubin: 0.8 mg/dL (ref 0.3–1.2)
Total Protein: 6.5 g/dL (ref 6.5–8.1)

## 2021-06-22 LAB — CBC WITH DIFFERENTIAL (CANCER CENTER ONLY)
Abs Immature Granulocytes: 0.01 10*3/uL (ref 0.00–0.07)
Basophils Absolute: 0 10*3/uL (ref 0.0–0.1)
Basophils Relative: 1 %
Eosinophils Absolute: 0.1 10*3/uL (ref 0.0–0.5)
Eosinophils Relative: 1 %
HCT: 42.6 % (ref 39.0–52.0)
Hemoglobin: 14.2 g/dL (ref 13.0–17.0)
Immature Granulocytes: 0 %
Lymphocytes Relative: 31 %
Lymphs Abs: 1.1 10*3/uL (ref 0.7–4.0)
MCH: 31.3 pg (ref 26.0–34.0)
MCHC: 33.3 g/dL (ref 30.0–36.0)
MCV: 94 fL (ref 80.0–100.0)
Monocytes Absolute: 0.4 10*3/uL (ref 0.1–1.0)
Monocytes Relative: 12 %
Neutro Abs: 2 10*3/uL (ref 1.7–7.7)
Neutrophils Relative %: 55 %
Platelet Count: 172 10*3/uL (ref 150–400)
RBC: 4.53 MIL/uL (ref 4.22–5.81)
RDW: 13.1 % (ref 11.5–15.5)
WBC Count: 3.6 10*3/uL — ABNORMAL LOW (ref 4.0–10.5)
nRBC: 0 % (ref 0.0–0.2)

## 2021-06-22 LAB — TSH: TSH: 2.246 u[IU]/mL (ref 0.320–4.118)

## 2021-06-25 ENCOUNTER — Telehealth: Payer: Self-pay | Admitting: *Deleted

## 2021-06-25 NOTE — Telephone Encounter (Signed)
-----   Message from Orson Slick, MD sent at 06/22/2021  1:08 PM EDT ----- Please let Mr. Brodhead know his labs look excellent. No evidence of thyroid dysfunction. Normal kidney function and no signs of anemia. We will see him back in 6 months or sooner if he has any questions or concerns.  ----- Message ----- From: Buel Ream, Lab In [place] Sent: 06/22/2021  10:56 AM EDT To: Orson Slick, MD

## 2021-06-25 NOTE — Telephone Encounter (Signed)
TCT patient regarding recent labs. Spoke with pt and advised that his labs look great. No areas of concern. Advised that we will see him again in 6 months.  Pt voiced understanding.

## 2021-08-14 ENCOUNTER — Other Ambulatory Visit: Payer: Self-pay | Admitting: Dentistry

## 2021-08-14 ENCOUNTER — Other Ambulatory Visit (HOSPITAL_COMMUNITY): Payer: Self-pay

## 2021-08-14 DIAGNOSIS — I700 Atherosclerosis of aorta: Secondary | ICD-10-CM | POA: Diagnosis not present

## 2021-08-14 DIAGNOSIS — G629 Polyneuropathy, unspecified: Secondary | ICD-10-CM | POA: Diagnosis not present

## 2021-08-14 DIAGNOSIS — I712 Thoracic aortic aneurysm, without rupture, unspecified: Secondary | ICD-10-CM | POA: Diagnosis not present

## 2021-08-14 DIAGNOSIS — Z23 Encounter for immunization: Secondary | ICD-10-CM | POA: Diagnosis not present

## 2021-08-14 DIAGNOSIS — C029 Malignant neoplasm of tongue, unspecified: Secondary | ICD-10-CM | POA: Diagnosis not present

## 2021-08-14 DIAGNOSIS — E7800 Pure hypercholesterolemia, unspecified: Secondary | ICD-10-CM | POA: Diagnosis not present

## 2021-08-16 ENCOUNTER — Other Ambulatory Visit (HOSPITAL_COMMUNITY): Payer: Self-pay

## 2021-08-16 DIAGNOSIS — C109 Malignant neoplasm of oropharynx, unspecified: Secondary | ICD-10-CM | POA: Diagnosis not present

## 2021-08-16 DIAGNOSIS — Z923 Personal history of irradiation: Secondary | ICD-10-CM | POA: Diagnosis not present

## 2021-08-17 ENCOUNTER — Other Ambulatory Visit (HOSPITAL_COMMUNITY): Payer: Self-pay

## 2021-08-21 ENCOUNTER — Other Ambulatory Visit (HOSPITAL_COMMUNITY): Payer: Self-pay

## 2021-08-21 ENCOUNTER — Other Ambulatory Visit: Payer: Self-pay | Admitting: Dentistry

## 2021-08-22 ENCOUNTER — Other Ambulatory Visit (HOSPITAL_COMMUNITY): Payer: Self-pay

## 2021-08-27 ENCOUNTER — Other Ambulatory Visit: Payer: Self-pay | Admitting: Dentistry

## 2021-08-27 ENCOUNTER — Other Ambulatory Visit (HOSPITAL_COMMUNITY): Payer: Self-pay

## 2021-08-30 ENCOUNTER — Other Ambulatory Visit (HOSPITAL_COMMUNITY): Payer: Self-pay

## 2021-08-31 ENCOUNTER — Other Ambulatory Visit (HOSPITAL_COMMUNITY): Payer: Self-pay

## 2021-09-01 ENCOUNTER — Other Ambulatory Visit (HOSPITAL_COMMUNITY): Payer: Self-pay

## 2021-09-05 ENCOUNTER — Other Ambulatory Visit (HOSPITAL_COMMUNITY): Payer: Self-pay

## 2021-09-07 ENCOUNTER — Other Ambulatory Visit: Payer: Self-pay | Admitting: Dentistry

## 2021-09-07 ENCOUNTER — Other Ambulatory Visit (HOSPITAL_COMMUNITY): Payer: Self-pay

## 2021-10-16 DIAGNOSIS — M5416 Radiculopathy, lumbar region: Secondary | ICD-10-CM | POA: Diagnosis not present

## 2021-10-22 DIAGNOSIS — L570 Actinic keratosis: Secondary | ICD-10-CM | POA: Diagnosis not present

## 2021-11-07 DIAGNOSIS — M5416 Radiculopathy, lumbar region: Secondary | ICD-10-CM | POA: Diagnosis not present

## 2021-11-08 DIAGNOSIS — Z923 Personal history of irradiation: Secondary | ICD-10-CM | POA: Diagnosis not present

## 2021-11-08 DIAGNOSIS — C109 Malignant neoplasm of oropharynx, unspecified: Secondary | ICD-10-CM | POA: Diagnosis not present

## 2021-11-29 ENCOUNTER — Telehealth: Payer: Self-pay | Admitting: Hematology and Oncology

## 2021-11-29 NOTE — Telephone Encounter (Signed)
R/s per provider pal, message has been left with pt ?

## 2021-12-03 DIAGNOSIS — M5416 Radiculopathy, lumbar region: Secondary | ICD-10-CM | POA: Diagnosis not present

## 2021-12-04 ENCOUNTER — Encounter: Payer: Self-pay | Admitting: Radiation Oncology

## 2021-12-04 ENCOUNTER — Ambulatory Visit
Admission: RE | Admit: 2021-12-04 | Discharge: 2021-12-04 | Disposition: A | Discharge status: Home / Self Care | Payer: Medicare Other | Source: Ambulatory Visit | Attending: Radiation Oncology | Admitting: Radiation Oncology

## 2021-12-04 ENCOUNTER — Other Ambulatory Visit: Payer: Self-pay

## 2021-12-04 VITALS — BP 119/70 | HR 55 | Temp 97.4°F | Resp 18 | Ht 72.0 in | Wt 164.0 lb

## 2021-12-04 DIAGNOSIS — C01 Malignant neoplasm of base of tongue: Secondary | ICD-10-CM | POA: Diagnosis not present

## 2021-12-04 DIAGNOSIS — Z923 Personal history of irradiation: Secondary | ICD-10-CM | POA: Diagnosis not present

## 2021-12-04 DIAGNOSIS — Z79899 Other long term (current) drug therapy: Secondary | ICD-10-CM | POA: Insufficient documentation

## 2021-12-04 DIAGNOSIS — Z08 Encounter for follow-up examination after completed treatment for malignant neoplasm: Secondary | ICD-10-CM | POA: Diagnosis not present

## 2021-12-04 DIAGNOSIS — Z85810 Personal history of malignant neoplasm of tongue: Secondary | ICD-10-CM | POA: Diagnosis not present

## 2021-12-04 IMAGING — MR MR LUMBAR SPINE W/O CM
4 of 5 series · 23 of 48 positions shown · non-contrast
Comparison: PET-CT 06/06/2020.  CT abdomen/pelvis 03/31/2013

CLINICAL DATA: Low back pain, unspecified back pain laterality,
unspecified chronicity, unspecified whether sciatica present.
Additional history provided by scanning technologist: Patient
reports low back and left leg pain for 2 months after fall, history
of throat/neck cancer.

EXAM:
MRI LUMBAR SPINE WITHOUT CONTRAST
TECHNIQUE: Multiplanar, multisequence MR imaging of the lumbar spine was
performed. No intravenous contrast was administered.

[Series 3: T2 · sagittal · 4.0mm · 0.57mm/px · 6 of 16 slices shown (1 of 2)]
[im 1/16]
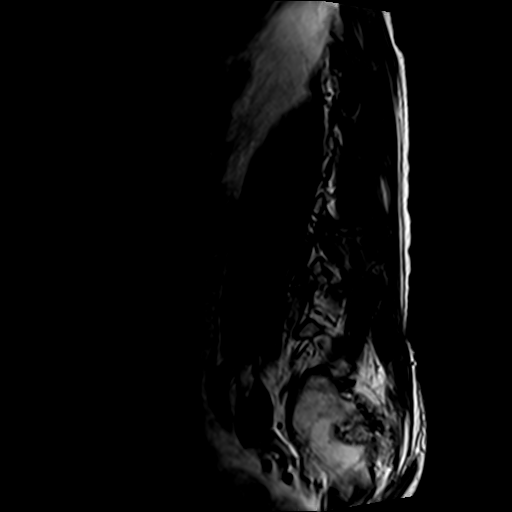
[im 4/16]
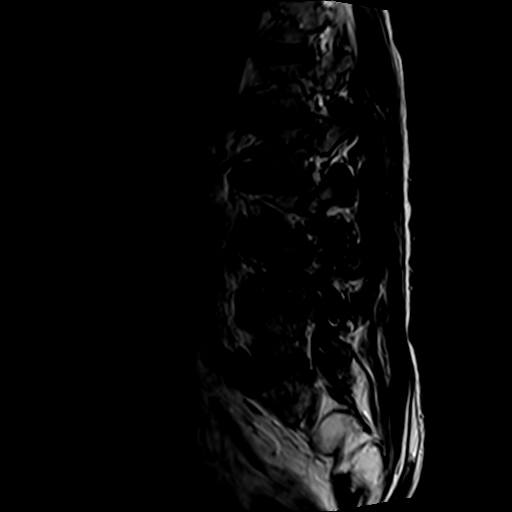
[im 7/16]
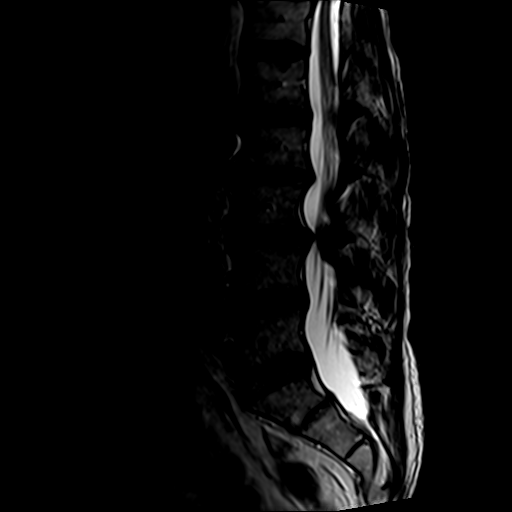
[im 10/16]
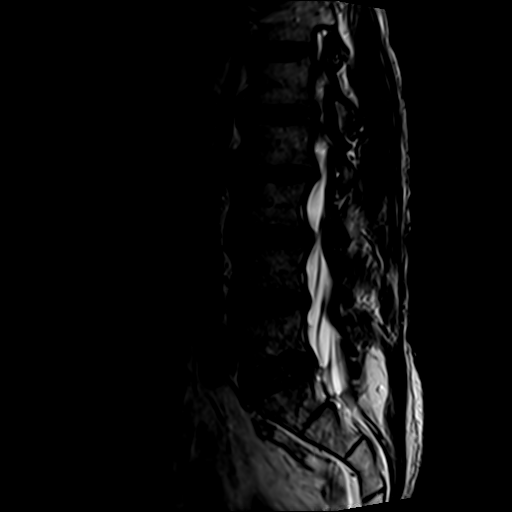
[im 13/16]
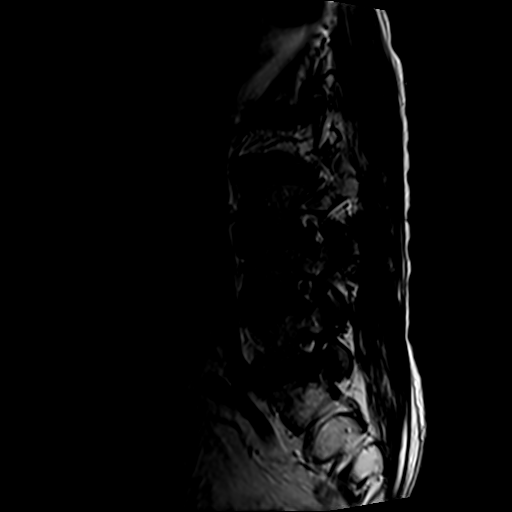
[im 16/16]
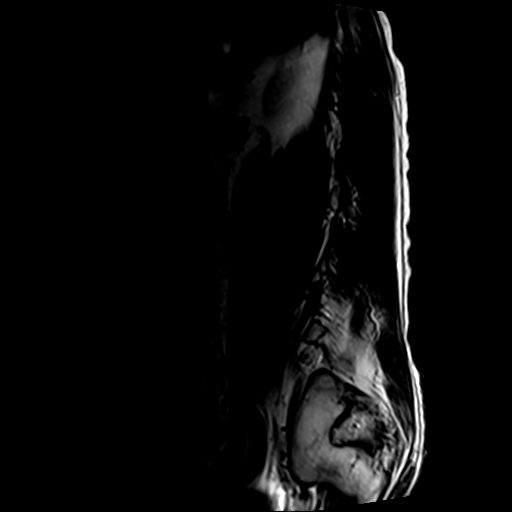

[Series 5: T1 · sagittal · 4.0mm · 0.57mm/px · 5 of 16 slices shown (1 of 2)]
[im 1/16]
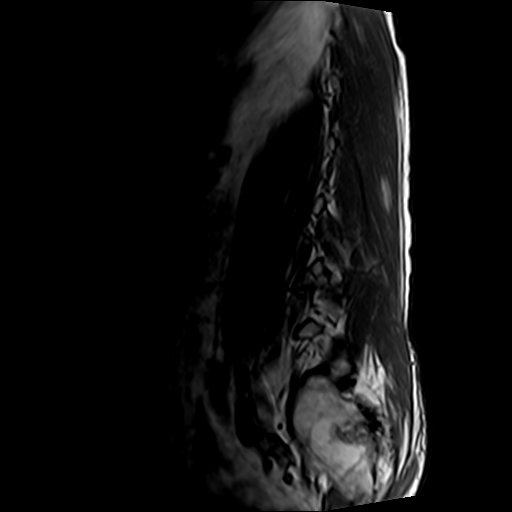
[im 4/16]
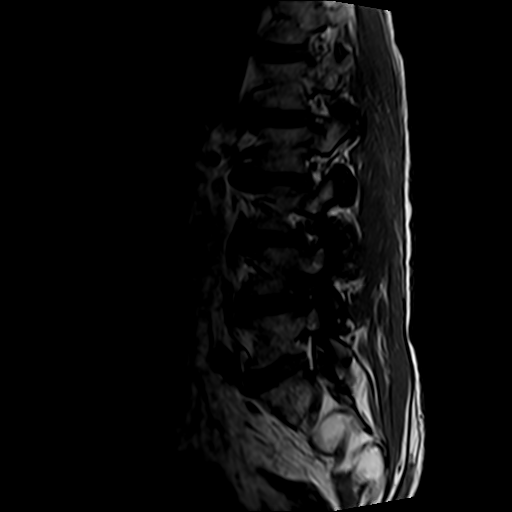
[im 7/16]
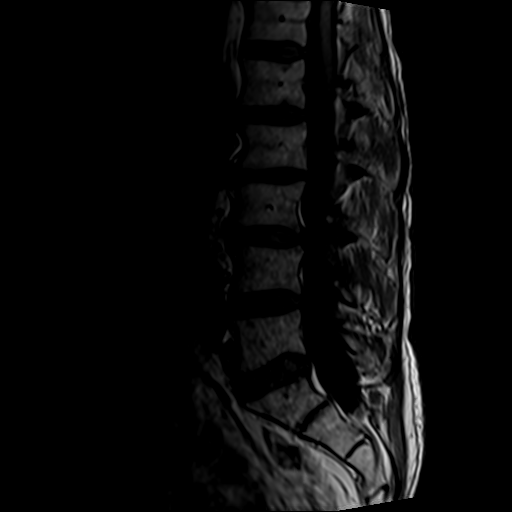
[im 10/16]
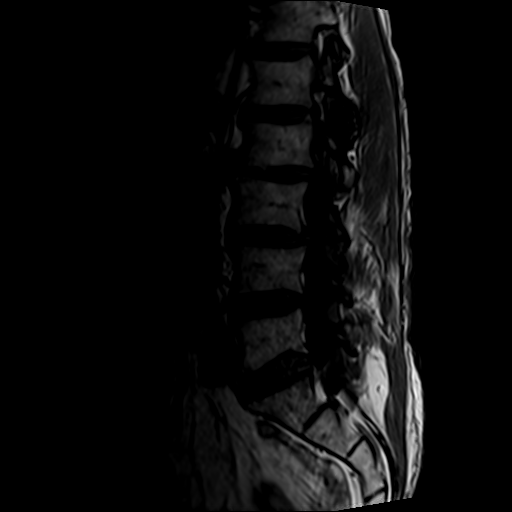
[im 16/16]
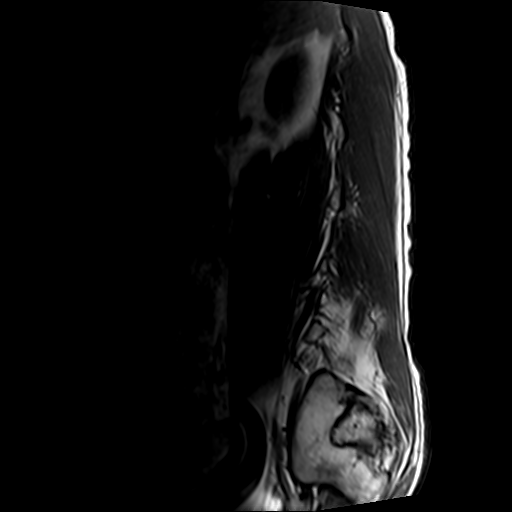

[Series 6: T2 · axial · 4.0mm · 0.70mm/px · z∈[-103,+120]mm · 9 of 40 slices shown (2 of 2)]
[im 1/40]
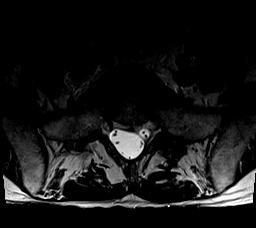
[im 6/40]
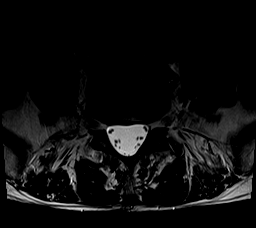
[im 12/40]
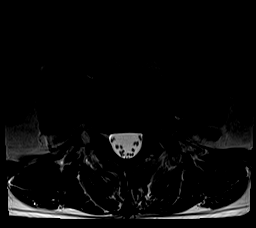
[im 17/40]
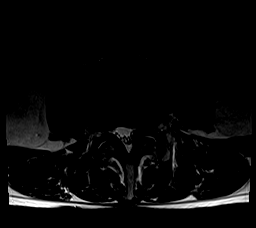
[im 20/40]
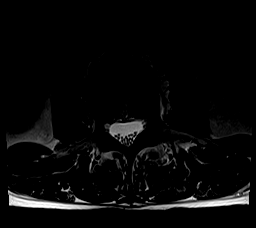
[im 23/40]
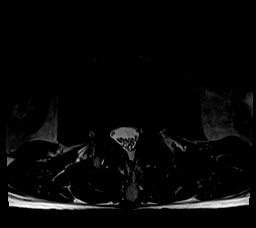
[im 28/40]
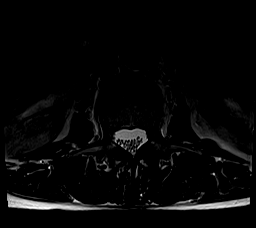
[im 34/40]
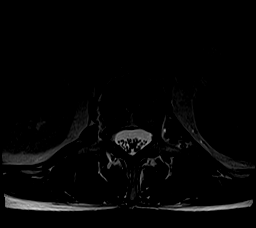
[im 40/40]
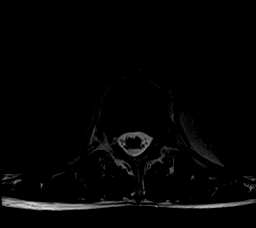

[Series 7: T1 · axial · 4.0mm · 0.35mm/px · z∈[-78,+89]mm · 3 of 40 slices shown (2 of 2)]
[im 6/40]
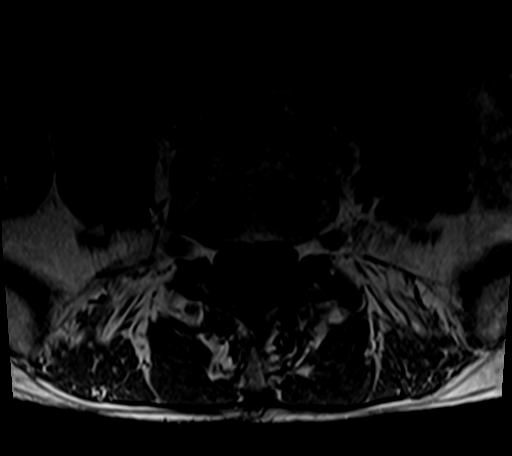
[im 20/40]
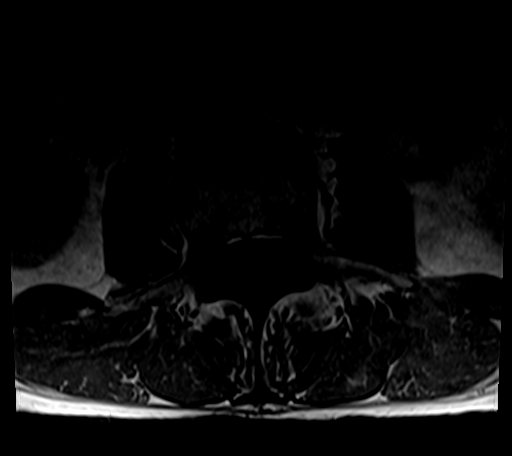
[im 34/40]
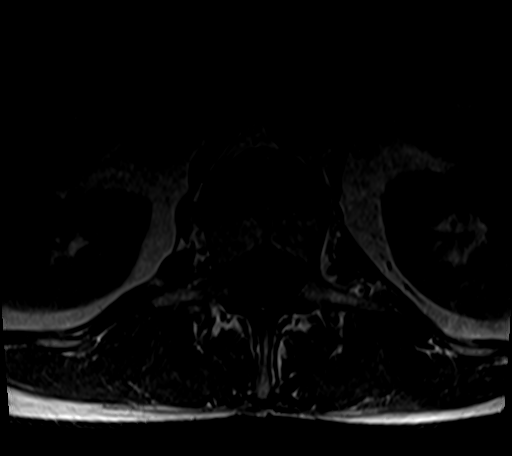

[23 of 48 positions shown; findings below may reference images not displayed]

FINDINGS: Segmentation:  5 lumbar vertebrae.

Alignment: Straightening of the expected lumbar lordosis. 2 mm L3-L4
grade 1 anterolisthesis. Trace L5-S1 grade 1 retrolisthesis.

Vertebrae: Vertebral body height is maintained. No significant
marrow edema or focal suspicious osseous lesion.

Conus medullaris and cauda equina: Conus extends to the L1-L2 level.
No signal abnormality within the visualized distal spinal cord.

Paraspinal and other soft tissues: Subcentimeter T2 hyperintense
lesion within the liver, incompletely assessed on the current exam
but possibly reflecting a cyst. Paraspinal soft tissues within
normal limits.

Disc levels:

Multilevel disc degeneration. Most notably, there is moderate to
moderately advanced disc degeneration at L2-L3 and L3-L4.

T12-L1: Small disc bulge. Mild facet arthrosis. No significant
spinal canal or foraminal stenosis.

L1-L2: Disc bulge. Superimposed small right subarticular/foraminal
disc protrusion. Mild-to-moderate facet arthrosis. Trace bilateral
facet joint effusions. Mild right subarticular narrowing with slight
crowding of the descending right L2 nerve root (series 6, image 10).
Central canal patent. Mild/moderate right neural foraminal
narrowing.

L2-L3: Disc bulge with endplate spurring. Mild facet arthrosis.
Minimal partial effacement of the ventral thecal sac without
significant subarticular or central canal stenosis. Mild left neural
foraminal narrowing.

L3-L4: Grade 1 anterolisthesis. Right foraminal zone posterior
annular fissure. Disc uncovering with disc bulge and endplate
spurring. Superimposed broad-based central to left foraminal disc
protrusion. Moderate facet arthrosis with ligamentum flavum
hypertrophy. Trace bilateral facet joint effusions. The disc
protrusion contributes to severe left subarticular stenosis,
encroaching upon the descending left L4 nerve root (series 6, image
24). Mild right subarticular narrowing. Mild relative narrowing of
the central canal. Bilateral neural foraminal narrowing
(mild/moderate right, moderate left).

L4-L5: Disc bulge. Superimposed left foraminal cranially migrated
disc extrusion (series 5, images 12 and 13). Mild facet arthrosis.
Trace bilateral facet joint effusions. No significant spinal canal
stenosis. The disc protrusion contributes to severe left neural
foraminal narrowing and impinges the exiting left L4 nerve root.
Mild/moderate right neural foraminal narrowing.

L5-S1: Trace retrolisthesis. Small disc bulge with endplate
spurring. Superimposed right subarticular/foraminal disc protrusion
at site of posterior annular fissure. Mild right subarticular
narrowing without appreciable nerve root impingement. Central canal
patent. Mild/moderate right neural foraminal narrowing.
IMPRESSION: Lumbar spondylosis as outlined and with findings most notably as
follows.

At L4-L5, a left foraminal cranially migrated disc extrusion
contributes to severe left neural foraminal narrowing and impinges
the exiting left L4 nerve root. Correlate for left L4 radiculopathy.
Multifactorial mild/moderate right neural foraminal narrowing also
present at this level.

At L3-L4, a broad-based central to left foraminal disc protrusion
contributes to multifactorial severe left subarticular stenosis and
encroaches upon the descending left L4 nerve root. Also at this
level, there is multifactorial mild right subarticular, mild central
canal and bilateral neural foraminal narrowing (mild/moderate right,
moderate left).

No more than mild spinal canal stenosis at the remaining levels.
Additional sites of mild and mild/moderate neural foraminal
narrowing as detailed.

## 2021-12-04 NOTE — Progress Notes (Signed)
Patrick Nelson presents today for follow-up after completing radiation to his base of tongue on 08/18/2020 ? ?Pain issues, if any: Patient denies any mouth or throat ?Using a feeding tube?: N/A--removed 12/25/2020 ?Weight changes, if any:  ?Wt Readings from Last 3 Encounters:  ?12/04/21 164 lb (74.4 kg)  ?05/31/21 157 lb 7 oz (71.4 kg)  ?03/22/21 160 lb 8 oz (72.8 kg)  ? ?Swallowing issues, if any: Patient denies. Does report he still has to avoid very hot/spicy foods, but otherwise feels he can eat/drink a wide variety ?Smoking or chewing tobacco? None ?Using fluoride trays daily? Patrick Nelson sees his community dentist ~every 3 months ?Last ENT visit was on: 11/08/2021 Saw Dr. Izora Gala:  ?"--Physical Exam:  ?Healthy-appearing gentleman no distress. Breathing and voice are clear. No palpable cervical adenopathy. Oral cavity and pharynx are healthy and clear. Dentition in good shape. Indirect exam of the larynx and hypopharynx are clear and healthy with normal cord mobility and no masses identified. Asymmetry of the soft palate as expected from radiation but no mass identified. Nasal exam clear. ?--Impression & Plans:  ?Stable, no evidence of persistent or recurrent disease. Recheck 3 months or sooner as needed" ? ?Other notable issues, if any: Denies any ear or jaw pain, or difficulty opening his mouth. Reports altered sense of taste is still present, but much improved. Reports occasional dry mouth. Denies any signs of swelling/lymphedema to his neck. Reports fatigue has resolved. Overall, reports he feels well and is pleased with his continued progress ? ? ? ? ?

## 2021-12-04 NOTE — Progress Notes (Signed)
?Radiation Oncology         (336) [phone] ?________________________________ ? ?Name: Patrick Nelson MRN: [id number]  ?Date: 12/04/2021  DOB: 01/07/1951 ? ?Follow-Up  ?Outpatient ? ? ?CC: Patrick Low, MD  Patrick Gala, MD ? ?Diagnoses: C01-Malignant neoplasm of base of tongue ? 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 ?Intent: Curative ? ?Radiation Treatment Dates: 07/03/2020 through 08/18/2020 ?Site Technique Total Dose (Gy) Dose per Fx (Gy) Completed Fx Beam Energies  ?Oropharynx: HN_BOT IMRT 70/70 2 35/35 6X  ? ? ?CHIEF COMPLAINT:  Here for follow-up and surveillance of throat cancer ? ?Narrative:     ?Radiation Oncology         [phone] ?________________________________ ? ?Name: Patrick Nelson MRN: [id number]  ?Date: 12/04/2021  DOB: 1951-05-15 ? ?Follow-Up  ?Outpatient ? ? ?CC: Patrick Low, MD  Patrick Gala, MD ? ?Diagnoses: C01-Malignant neoplasm of base of tongue ? 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 ?Intent: Curative ? ?Radiation Treatment Dates: 07/03/2020 through 08/18/2020 ?Site Technique Total Dose (Gy) Dose per Fx (Gy) Completed Fx Beam Energies  ?Oropharynx: HN_BOT IMRT 70/70 2 35/35 6X  ? ? ?CHIEF COMPLAINT:  Here for follow-up and surveillance of throat cancer ? ?Narrative:    Patrick Nelson presents today for follow-up after completing radiation to his base of tongue on 08/18/2020 ? ?Pain issues, if any: Patient denies any mouth or throat ?Using a feeding tube?: N/A--removed 12/25/2020 ?Weight changes, if any:  ?Wt Readings from Last 3 Encounters:  ?12/04/21 164 lb (74.4  kg)  ?05/31/21 157 lb 7 oz (71.4 kg)  ?03/22/21 160 lb 8 oz (72.8 kg)  ? ?Swallowing issues, if any: Patient denies. Does report he still has to avoid very hot/spicy foods, but otherwise feels he can eat/drink a wide variety ?Smoking or chewing tobacco? None ?Using fluoride trays daily? [name] sees his community dentist ~every 3 months ?Last ENT visit was on: 11/08/2021 Saw Dr. Izora Nelson:  ?"--Physical Exam:  ?Healthy-appearing gentleman no distress. Breathing and voice are clear. No palpable cervical adenopathy. Oral cavity and pharynx are healthy and clear. Dentition in good shape. Indirect exam of the larynx and hypopharynx are clear and healthy with normal cord mobility and no masses identified. Asymmetry of the soft palate as expected from radiation but no mass identified. Nasal exam clear. ?--Impression & Plans:  ?Stable, no evidence of persistent or recurrent disease. Recheck 3 months or sooner as needed" ? ?Other notable issues, if any: Denies any ear or jaw pain, or difficulty opening his mouth. Reports altered sense of taste is still present, but much improved. Reports occasional dry mouth. Denies any signs of swelling/lymphedema to his neck. Reports fatigue has resolved. Overall, reports he feels well and is pleased with his continued progress ? ? ? ?ALLERGIES:  has No Known Allergies. ? ?Meds: ?Current Outpatient Medications  ?Medication Sig Dispense Refill  ? atorvastatin (LIPITOR) 10 MG tablet Take 10 mg by mouth daily.    ? DENTA 5000 PLUS 1.1 % CREA dental cream Place 1 application. onto teeth See admin instructions.    ? gabapentin (NEURONTIN) 300 MG capsule Take 300  mg by mouth at bedtime.    ? amoxicillin (AMOXIL) 500 MG capsule Take four capsules one hour before dental appointment. (Patient not taking: Reported on 11/28/2020) 4 capsule 3  ? Cholecalciferol (VITAMIN D3 SUPER STRENGTH) 50 MCG (2000 UT) TABS Take 2,000 Units by mouth. (Patient not taking: Reported on 12/04/2021)    ? cyanocobalamin  1000 MCG tablet Take 1,000 mcg by mouth daily. (Patient not taking: Reported on 12/04/2021)    ? folic acid (FOLVITE) 846 MCG tablet Take 400 mcg by mouth daily. (Patient not taking: Reported on 12/04/2021)    ? Misc Natural Products (CHROMIUM PICOLINATE FORTIFIED PO) Take by mouth. (Patient not taking: Reported on 12/04/2021)    ? ?No current facility-administered medications for this encounter.  ? ? ?Physical Findings: ?The patient is in no acute distress. Patient is alert and oriented. ?Wt Readings from Last 3 Encounters:  ?12/04/21 164 lb (74.4 kg)  ?05/31/21 157 lb 7 oz (71.4 kg)  ?03/22/21 160 lb 8 oz (72.8 kg)  ? ? height is 6' (1.829 m) and weight is 164 lb (74.4 kg). His temporal temperature is 97.4 ?F (36.3 ?C) (abnormal). His blood pressure is 119/70 and his pulse is 55 (abnormal). His respiration is 18 and oxygen saturation is 99%.   ?  ?General: Alert and oriented, in no acute distress ?HEENT: Head is normocephalic. Extraocular movements are intact. Oropharynx is clear. ?Neck: Neck is supple, no palpable cervical or supraclavicular lymphadenopathy. ?Chest CTAB ?Heart RRR no murmurs ?Skin: No concerning lesions.  Satisfactory healing of her neck and radiation fields ?Musculoskeletal: symmetric strength and muscle tone throughout. ?Neurologic: Cranial nerves II through XII are grossly intact. No obvious focalities. Speech is fluent. Coordination is intact. ?Psychiatric: Judgment and insight are intact. Affect is appropriate. ? ? ? ?Lab Findings: ?Lab Results  ?Component Value Date  ? WBC 3.6 (L) 06/22/2021  ? HGB 14.2 06/22/2021  ? HCT 42.6 06/22/2021  ? MCV 94.0 06/22/2021  ? PLT 172 06/22/2021  ? ? ?Lab Results  ?Component Value Date  ? TSH 2.246 06/22/2021  ? ? ?Radiographic Findings: ?No results found. ? ? ?Impression/Plan:   ? ?1) Head and Neck Cancer Status: NED  ? ?2) Nutritional Status:  Maintaining weight  - PEG removed ?Wt Readings from Last 3 Encounters:  ?12/04/21 164 lb (74.4 kg)  ?05/31/21 157 lb  7 oz (71.4 kg)  ?03/22/21 160 lb 8 oz (72.8 kg)  ? ?  ?3)   Swallowing: Satisfactory function, continue swallowing exercises ? ?4) Dental: Encouraged to continue regular followup with dentistry, and dental hygiene including fluoride rinses. He is seeing dentist q 40mo  Knows to contact me if extractions are ever recommended so I can provide radiation dose estimates to dentist ? ?5) Thyroid function: Check annually w/ med onc - WNL ?Lab Results  ?Component Value Date  ? TSH 2.246 06/22/2021  ? ? ?6)   Continue to follow-up with medical oncology and otolaryngologist.   I will see him back PRN as he is doing so well at this juncture. We did discuss the pros and cons of surveillance imaging - given that he is symptomatically doing great and per physical exam without evidence of disease, we will only order imaging as clinically indicated. He is pleased with this plan. ? ?On date of service, in total, I spent 25 minutes on this encounter.    ?_____________________________________ ? ? ?SEppie Gibson MD ? ?

## 2021-12-20 ENCOUNTER — Other Ambulatory Visit: Payer: Medicare Other

## 2021-12-20 ENCOUNTER — Ambulatory Visit: Payer: Medicare Other | Admitting: Hematology and Oncology

## 2021-12-25 ENCOUNTER — Other Ambulatory Visit: Payer: Self-pay | Admitting: Physician Assistant

## 2021-12-25 DIAGNOSIS — C01 Malignant neoplasm of base of tongue: Secondary | ICD-10-CM

## 2021-12-26 ENCOUNTER — Inpatient Hospital Stay: Payer: Medicare Other | Admitting: Physician Assistant

## 2021-12-26 ENCOUNTER — Other Ambulatory Visit: Payer: Self-pay

## 2021-12-26 ENCOUNTER — Inpatient Hospital Stay: Payer: Medicare Other | Attending: Nurse Practitioner

## 2021-12-26 VITALS — BP 115/82 | HR 63 | Temp 98.1°F | Resp 18 | Wt 165.2 lb

## 2021-12-26 DIAGNOSIS — C01 Malignant neoplasm of base of tongue: Secondary | ICD-10-CM

## 2021-12-26 DIAGNOSIS — Z923 Personal history of irradiation: Secondary | ICD-10-CM | POA: Diagnosis not present

## 2021-12-26 LAB — CBC WITH DIFFERENTIAL (CANCER CENTER ONLY)
Abs Immature Granulocytes: 0.01 10*3/uL (ref 0.00–0.07)
Basophils Absolute: 0 10*3/uL (ref 0.0–0.1)
Basophils Relative: 1 %
Eosinophils Absolute: 0.1 10*3/uL (ref 0.0–0.5)
Eosinophils Relative: 2 %
HCT: 40.5 % (ref 39.0–52.0)
Hemoglobin: 13.6 g/dL (ref 13.0–17.0)
Immature Granulocytes: 0 %
Lymphocytes Relative: 22 %
Lymphs Abs: 1 10*3/uL (ref 0.7–4.0)
MCH: 31 pg (ref 26.0–34.0)
MCHC: 33.6 g/dL (ref 30.0–36.0)
MCV: 92.3 fL (ref 80.0–100.0)
Monocytes Absolute: 0.3 10*3/uL (ref 0.1–1.0)
Monocytes Relative: 8 %
Neutro Abs: 2.9 10*3/uL (ref 1.7–7.7)
Neutrophils Relative %: 67 %
Platelet Count: 177 10*3/uL (ref 150–400)
RBC: 4.39 MIL/uL (ref 4.22–5.81)
RDW: 13 % (ref 11.5–15.5)
WBC Count: 4.3 10*3/uL (ref 4.0–10.5)
nRBC: 0 % (ref 0.0–0.2)

## 2021-12-26 LAB — CMP (CANCER CENTER ONLY)
ALT: 22 U/L (ref 0–44)
AST: 27 U/L (ref 15–41)
Albumin: 3.8 g/dL (ref 3.5–5.0)
Alkaline Phosphatase: 66 U/L (ref 38–126)
Anion gap: 6 (ref 5–15)
BUN: 17 mg/dL (ref 8–23)
CO2: 29 mmol/L (ref 22–32)
Calcium: 9 mg/dL (ref 8.9–10.3)
Chloride: 104 mmol/L (ref 98–111)
Creatinine: 0.87 mg/dL (ref 0.61–1.24)
GFR, Estimated: >60 mL/min (ref >60)
Glucose, Bld: 97 mg/dL (ref 70–99)
Potassium: 4.1 mmol/L (ref 3.5–5.1)
Sodium: 139 mmol/L (ref 135–145)
Total Bilirubin: 0.5 mg/dL (ref 0.3–1.2)
Total Protein: 6.2 g/dL — ABNORMAL LOW (ref 6.5–8.1)

## 2021-12-26 NOTE — Progress Notes (Signed)
?[place] ?Telephone:(336) [phone]   [phone] ? ?PROGRESS NOTE ? ?Patient Care Team: ?Wenda Low, MD as PCP - General (Internal Medicine) ?Malmfelt, Stephani Police, RN as Oncology Nurse Navigator ?Eppie Gibson, MD as Consulting Physician (Radiation Oncology) ?Orson Slick, MD as Consulting Physician (Hematology and Oncology) ?Sharen Counter, CCC-SLP as Psychologist, clinical (Speech Pathology) ?Wynelle Beckmann, Melodie Bouillon, PT as Physical Therapist (Physical Therapy) ?Beverely Pace, LCSW (Inactive) as Education officer, museum Hotel manager) ?Karie Mainland, RD as Dietitian (Nutrition) ?Izora Gala, MD as Consulting Physician (Otolaryngology) ?Alla Feeling, NP as Nurse Practitioner (Nurse Practitioner) ? ?Hematological/Oncological History ?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 P16 positive. Stage I   ?1) 05/11/2020: CT of the neck showed a 1.5 cm mass at the base of the tongue on the right and pathologically enlarged right level 2 and level 3 lymph nodes ?2) 05/22/2020: direct laryngoscopy with biopsy. Mass biopsied at the right base of the tongue. Biopsy showed invasive moderately differentiated squamous cell carcinoma ?3) 06/06/2020: PET CT scan showed hypermetabolic right tongue base mass consistent with known neoplasm. Right level 2 and level 3 metastatic adenopathy. No evidence of metastatic disease.  ?4) 06/09/2020: established care with Dr. Isidore Moos ?5) 06/12/2020: establish care with Dr. Lorenso Courier  ?6) 07/03/2020: Week 1 of Cisplatin chemoradiation ?7) 07/10/2020: Week 2 of Cisplatin chemoradiation ?8) 07/17/2020: Week 3 of Cisplatin chemoradiation ?9) 07/24/2020: Week 4 of Cisplatin chemoradiation ?10) 07/31/2020: Week 5 of Cisplatin chemoradiation ?11) 08/07/2020: Week 6 of Cisplatin chemoradiation ?12) 08/14/2020: Week 7 of Cisplatin chemoradiation ?13) 08/18/2020: end of radiation therapy.  ?14) 11/24/2020: PET CT scan shows a complete remission, no  FDG avid lesions or concern for residual/recurrent disease.  ? ?Interval History:  ?Patrick Nelson 71 y.o. male with medical history significant for SCC of the base of the tongue who presents for a follow up visit. The patient's last visit was on 03/22/2021 by Dr. Lorenso Courier. In the interim since the last visit he has had no major changes in his health. ? ?At today's exam, Patrick Nelson reports that he continues to improve with his energy and appetite.  He is able to stay active and complete his daily activities on his own.  He is enjoying golfing and other outdoor activities.  He has occasional episodes of dysphagia and regurgitation, mainly with solid foods like breads and meats.  He denies any nausea, vomiting or abdominal pain.  His bowel habits are regular without any diarrhea or constipation.  He denies easy bruising or signs of active bleeding.  He denies fevers, chills, night sweats, shortness of breath, chest pain or cough.  He has no other complaints.  Rest of the 10 point ROS is below. ? ?MEDICAL HISTORY:  ?Past Medical History:  ?Diagnosis Date  ? Hyperlipidemia   ? Paroxysmal atrial fibrillation (HCC)   ? ? ?SURGICAL HISTORY: ?Past Surgical History:  ?Procedure Laterality Date  ? DIRECT LARYNGOSCOPY N/A 05/22/2020  ? Procedure: DIRECT LARYNGOSCOPY w/BIOPSY TONGUE BASE;  Surgeon: Izora Gala, MD;  Location: Home;  Service: ENT;  Laterality: N/A;  ? IR GASTROSTOMY TUBE MOD SED  06/26/2020  ? IR GASTROSTOMY TUBE REMOVAL  12/25/2020  ? IR IMAGING GUIDED PORT INSERTION  06/26/2020  ? IR REMOVAL TUN ACCESS W/ PORT W/O FL MOD SED  12/25/2020  ? TOTAL HIP ARTHROPLASTY  2012  ? right hip-Dr. Berenice Primas  ? ? ?SOCIAL HISTORY: ?Social History  ? ?Socioeconomic History  ?  Marital status: Widowed  ?  Spouse name: Not on file  ? Number of children: 1  ? Years of education: Not on file  ? Highest education level: Not on file  ?Occupational History  ? Not on file  ?Tobacco Use  ? Smoking status: Never  ? Smokeless  tobacco: Never  ?Vaping Use  ? Vaping Use: Never used  ?Substance and Sexual Activity  ? Alcohol use: Yes  ? Drug use: No  ? Sexual activity: Yes  ?Other Topics Concern  ? Not on file  ?Social History Narrative  ? Not on file  ? ?Social Determinants of Health  ? ?Financial Resource Strain: Not on file  ?Food Insecurity: Not on file  ?Transportation Needs: Not on file  ?Physical Activity: Not on file  ?Stress: Not on file  ?Social Connections: Not on file  ?Intimate Partner Violence: Not on file  ? ? ?FAMILY HISTORY: ?Family History  ?Problem Relation Age of Onset  ? Hypertension Mother   ? Heart disease Mother   ? Heart disease Brother   ? ? ?ALLERGIES:  has No Known Allergies. ? ?MEDICATIONS:  ?Current Outpatient Medications  ?Medication Sig Dispense Refill  ? amoxicillin (AMOXIL) 500 MG capsule Take four capsules one hour before dental appointment. 4 capsule 3  ? atorvastatin (LIPITOR) 10 MG tablet Take 10 mg by mouth daily.    ? Cholecalciferol (VITAMIN D3 SUPER STRENGTH) 50 MCG (2000 UT) TABS Take 2,000 Units by mouth.    ? cyanocobalamin 1000 MCG tablet Take 1,000 mcg by mouth daily.    ? DENTA 5000 PLUS 1.1 % CREA dental cream Place 1 application. onto teeth See admin instructions.    ? folic acid (FOLVITE) 174 MCG tablet Take 400 mcg by mouth daily.    ? gabapentin (NEURONTIN) 300 MG capsule Take 300 mg by mouth at bedtime.    ? Misc Natural Products (CHROMIUM PICOLINATE FORTIFIED PO) Take by mouth.    ? ?No current facility-administered medications for this visit.  ? ? ?REVIEW OF SYSTEMS:   ?Constitutional: ( - ) fevers, ( - )  chills , ( - ) night sweats ?Eyes: ( - ) blurriness of vision, ( - ) double vision, ( - ) watery eyes ?Ears, nose, mouth, throat, and face: ( - ) mucositis, ( - ) sore throat ?Respiratory: ( - ) cough, ( - ) dyspnea, ( - ) wheezes ?Cardiovascular: ( - ) palpitation, ( - ) chest discomfort, ( - ) lower extremity swelling ?Gastrointestinal:  ( - ) nausea, ( - ) heartburn, ( - ) change  in bowel habits ?Skin: ( - ) abnormal skin rashes ?Lymphatics: ( - ) new lymphadenopathy, ( - ) easy bruising ?Neurological: ( - ) numbness, ( - ) tingling, ( - ) new weaknesses ?Behavioral/Psych: ( - ) mood change, ( - ) new changes  ?All other systems were reviewed with the patient and are negative. ? ?PHYSICAL EXAMINATION: ?ECOG PERFORMANCE STATUS: 0 - Asymptomatic ? ?Vitals:  ? 12/26/21 1525  ?BP: 115/82  ?Pulse: 63  ?Resp: 18  ?Temp: 98.1 ?F (36.7 ?C)  ?SpO2: 99%  ? ?Filed Weights  ? 12/26/21 1525  ?Weight: 165 lb 3.2 oz (74.9 kg)  ? ? ?GENERAL: well appearing elderly Caucasian male alert, no distress and comfortable ?SKIN:  Normal tugor, no more erythema around neck. No skin lesions noted. ?EYES: conjunctiva are pink and non-injected, sclera clear ?OROPHARYNX: No evidence of ulcer/infection.  ?LUNGS: clear to auscultation and percussion with normal breathing  effort ?HEART: regular rate & rhythm and no murmurs and no lower extremity edema ?Musculoskeletal: no cyanosis of digits and no clubbing  ?PSYCH: alert & oriented x 3, fluent speech ?NEURO: no focal motor/sensory deficits ? ?LABORATORY DATA:  ?I have reviewed the data as listed ? ?  Latest Ref Rng & Units 12/26/2021  ?  3:06 PM 06/22/2021  ? 10:36 AM 03/22/2021  ?  7:29 AM  ?CBC  ?WBC 4.0 - 10.5 K/uL 4.3   3.6   3.3    ?Hemoglobin 13.0 - 17.0 g/dL 13.6   14.2   13.2    ?Hematocrit 39.0 - 52.0 % 40.5   42.6   38.7    ?Platelets 150 - 400 K/uL 177   172   161    ? ? ? ?  Latest Ref Rng & Units 12/26/2021  ?  3:06 PM 06/22/2021  ? 10:36 AM 03/22/2021  ?  7:29 AM  ?CMP  ?Glucose 70 - 99 mg/dL 97   107   107    ?BUN 8 - 23 mg/dL '17   11   15    '$ ?Creatinine 0.61 - 1.24 mg/dL 0.87   0.95   0.85    ?Sodium 135 - 145 mmol/L 139   137   141    ?Potassium 3.5 - 5.1 mmol/L 4.1   4.5   4.3    ?Chloride 98 - 111 mmol/L 104   103   107    ?CO2 22 - 32 mmol/L '29   27   25    '$ ?Calcium 8.9 - 10.3 mg/dL 9.0   10.1   9.2    ?Total Protein 6.5 - 8.1 g/dL 6.2   6.5   5.7    ?Total  Bilirubin 0.3 - 1.2 mg/dL 0.5   0.8   0.5    ?Alkaline Phos 38 - 126 U/L 66   87   78    ?AST 15 - 41 U/L '27   21   27    '$ ?ALT 0 - 44 U/L '22   16   26    '$ ? ? ?RADIOGRAPHIC STUDIES: ?No results found. ? ? ?A

## 2021-12-27 ENCOUNTER — Telehealth: Payer: Self-pay | Admitting: Hematology and Oncology

## 2021-12-27 LAB — TSH: TSH: 1.993 u[IU]/mL (ref 0.320–4.118)

## 2021-12-27 NOTE — Telephone Encounter (Signed)
Scheduled per 3/29 los, calender has been mailed ?

## 2022-01-16 DIAGNOSIS — H02831 Dermatochalasis of right upper eyelid: Secondary | ICD-10-CM | POA: Diagnosis not present

## 2022-01-16 DIAGNOSIS — H5203 Hypermetropia, bilateral: Secondary | ICD-10-CM | POA: Diagnosis not present

## 2022-01-16 DIAGNOSIS — H11153 Pinguecula, bilateral: Secondary | ICD-10-CM | POA: Diagnosis not present

## 2022-01-16 DIAGNOSIS — H02834 Dermatochalasis of left upper eyelid: Secondary | ICD-10-CM | POA: Diagnosis not present

## 2022-01-16 DIAGNOSIS — H35363 Drusen (degenerative) of macula, bilateral: Secondary | ICD-10-CM | POA: Diagnosis not present

## 2022-01-16 DIAGNOSIS — H52203 Unspecified astigmatism, bilateral: Secondary | ICD-10-CM | POA: Diagnosis not present

## 2022-01-16 DIAGNOSIS — H2513 Age-related nuclear cataract, bilateral: Secondary | ICD-10-CM | POA: Diagnosis not present

## 2022-01-16 DIAGNOSIS — H524 Presbyopia: Secondary | ICD-10-CM | POA: Diagnosis not present

## 2022-02-04 DIAGNOSIS — M5416 Radiculopathy, lumbar region: Secondary | ICD-10-CM | POA: Diagnosis not present

## 2022-02-05 DIAGNOSIS — Z923 Personal history of irradiation: Secondary | ICD-10-CM | POA: Diagnosis not present

## 2022-02-05 DIAGNOSIS — H6123 Impacted cerumen, bilateral: Secondary | ICD-10-CM | POA: Diagnosis not present

## 2022-02-05 DIAGNOSIS — Z85810 Personal history of malignant neoplasm of tongue: Secondary | ICD-10-CM | POA: Diagnosis not present

## 2022-02-05 DIAGNOSIS — Z9221 Personal history of antineoplastic chemotherapy: Secondary | ICD-10-CM | POA: Diagnosis not present

## 2022-02-19 ENCOUNTER — Other Ambulatory Visit: Payer: Self-pay | Admitting: Internal Medicine

## 2022-02-19 DIAGNOSIS — E7800 Pure hypercholesterolemia, unspecified: Secondary | ICD-10-CM | POA: Diagnosis not present

## 2022-02-19 DIAGNOSIS — I700 Atherosclerosis of aorta: Secondary | ICD-10-CM | POA: Diagnosis not present

## 2022-02-19 DIAGNOSIS — I712 Thoracic aortic aneurysm, without rupture, unspecified: Secondary | ICD-10-CM

## 2022-02-19 DIAGNOSIS — G629 Polyneuropathy, unspecified: Secondary | ICD-10-CM | POA: Diagnosis not present

## 2022-02-19 DIAGNOSIS — Z0000 Encounter for general adult medical examination without abnormal findings: Secondary | ICD-10-CM | POA: Diagnosis not present

## 2022-02-19 DIAGNOSIS — C029 Malignant neoplasm of tongue, unspecified: Secondary | ICD-10-CM | POA: Diagnosis not present

## 2022-02-19 DIAGNOSIS — R7303 Prediabetes: Secondary | ICD-10-CM | POA: Diagnosis not present

## 2022-03-12 ENCOUNTER — Telehealth: Payer: Self-pay | Admitting: Hematology and Oncology

## 2022-03-12 DIAGNOSIS — M5416 Radiculopathy, lumbar region: Secondary | ICD-10-CM | POA: Diagnosis not present

## 2022-03-12 NOTE — Telephone Encounter (Signed)
Per 6/13 provider reschedule.  Called and spoke to pt about reschedule.  Pt confirmed appointment

## 2022-03-14 ENCOUNTER — Ambulatory Visit
Admission: RE | Admit: 2022-03-14 | Discharge: 2022-03-14 | Disposition: A | Discharge status: Home / Self Care | Payer: Medicare Other | Source: Ambulatory Visit | Attending: Internal Medicine | Admitting: Internal Medicine

## 2022-03-14 DIAGNOSIS — M47814 Spondylosis without myelopathy or radiculopathy, thoracic region: Secondary | ICD-10-CM | POA: Diagnosis not present

## 2022-03-14 DIAGNOSIS — I712 Thoracic aortic aneurysm, without rupture, unspecified: Secondary | ICD-10-CM | POA: Diagnosis not present

## 2022-03-14 DIAGNOSIS — I700 Atherosclerosis of aorta: Secondary | ICD-10-CM | POA: Diagnosis not present

## 2022-03-14 MED ORDER — IOPAMIDOL (ISOVUE-370) INJECTION 76%
75.0000 mL | Freq: Once | INTRAVENOUS | Status: AC | PRN
Start: 2022-03-14 — End: 2022-03-14
  Administered 2022-03-14: 75 mL via INTRAVENOUS

## 2022-04-08 DIAGNOSIS — M5416 Radiculopathy, lumbar region: Secondary | ICD-10-CM | POA: Diagnosis not present

## 2022-04-22 ENCOUNTER — Other Ambulatory Visit: Payer: Self-pay

## 2022-05-07 ENCOUNTER — Other Ambulatory Visit: Payer: Self-pay

## 2022-05-09 DIAGNOSIS — Z923 Personal history of irradiation: Secondary | ICD-10-CM | POA: Diagnosis not present

## 2022-05-09 DIAGNOSIS — C109 Malignant neoplasm of oropharynx, unspecified: Secondary | ICD-10-CM | POA: Diagnosis not present

## 2022-05-13 DIAGNOSIS — M25552 Pain in left hip: Secondary | ICD-10-CM | POA: Diagnosis not present

## 2022-05-20 DIAGNOSIS — D225 Melanocytic nevi of trunk: Secondary | ICD-10-CM | POA: Diagnosis not present

## 2022-05-20 DIAGNOSIS — L814 Other melanin hyperpigmentation: Secondary | ICD-10-CM | POA: Diagnosis not present

## 2022-05-20 DIAGNOSIS — L821 Other seborrheic keratosis: Secondary | ICD-10-CM | POA: Diagnosis not present

## 2022-05-20 DIAGNOSIS — L570 Actinic keratosis: Secondary | ICD-10-CM | POA: Diagnosis not present

## 2022-05-20 DIAGNOSIS — L578 Other skin changes due to chronic exposure to nonionizing radiation: Secondary | ICD-10-CM | POA: Diagnosis not present

## 2022-05-24 DIAGNOSIS — M4316 Spondylolisthesis, lumbar region: Secondary | ICD-10-CM | POA: Diagnosis not present

## 2022-05-24 DIAGNOSIS — M5416 Radiculopathy, lumbar region: Secondary | ICD-10-CM | POA: Diagnosis not present

## 2022-05-27 ENCOUNTER — Other Ambulatory Visit: Payer: Self-pay | Admitting: Neurological Surgery

## 2022-05-27 DIAGNOSIS — M5416 Radiculopathy, lumbar region: Secondary | ICD-10-CM

## 2022-05-29 NOTE — Progress Notes (Unsigned)
CLINIC:  Survivorship  Patient Care Team: Wenda Low, MD as PCP - General (Internal Medicine) Malmfelt, Stephani Police, RN as Oncology Nurse Navigator Eppie Gibson, MD as Consulting Physician (Radiation Oncology) Orson Slick, MD as Consulting Physician (Hematology and Oncology) Sharen Counter, CCC-SLP as Speech Language Pathologist (Speech Pathology) Wynelle Beckmann, Melodie Bouillon, PT as Physical Therapist (Physical Therapy) Beverely Pace, LCSW (Inactive) as Social Worker (General Practice) Karie Mainland, RD as Dietitian (Nutrition) Izora Gala, MD as Consulting Physician (Otolaryngology) Alla Feeling, NP as Nurse Practitioner (Nurse Practitioner)   REASON FOR VISIT:  Routine follow-up for history of head & neck cancer.  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 Mass biopsied at the right base of the tongue. Biopsy showed invasive moderately differentiated squamous cell carcinoma 3) 06/06/2020: PET CT scan showed hypermetabolic right tongue base mass consistent with known neoplasm. Right level 2 and level 3 metastatic adenopathy. No evidence of metastatic disease.  4) 06/09/2020: established care with Dr. Isidore Moos 5) 06/12/2020: establish care with Dr. Lorenso Courier  6) 07/03/2020: Week 1 of Cisplatin chemoradiation 7) 07/10/2020: Week 2 of Cisplatin chemoradiation 8) 07/17/2020: Week 3 of Cisplatin chemoradiation 9) 07/24/2020: Week 4 of Cisplatin chemoradiation 10) 07/31/2020: Week 5 of Cisplatin chemoradiation 11) 08/07/2020: Week 6 of Cisplatin chemoradiation 12) 08/14/2020: Week 7 of Cisplatin chemoradiation 13) 08/18/2020: end of radiation therapy.  14) 11/24/2020: PET CT scan shows a complete remission,  no FDG avid lesions or concern for residual/recurrent disease.    INTERVAL HISTORY: Patrick Nelson returns for follow up as scheduled, last seen by Dede Query, PA 12/26/21 and ENT Dr. Constance Holster 05/09/22.  ***  -Pain:  -Nutrition/Diet:  -Dysphagia?:  -Dental issues?: using fluoride trays?  -Last TSH:  -Weight: (LOSS/GAIN) since (last visit)   -Last ENT visit:   -Last Rad Onc visit:  -Last Dentist visit:     ADDITIONAL REVIEW OF SYSTEMS:  ROS    CURRENT MEDICATIONS:  Current Outpatient Medications on File Prior to Visit  Medication Sig Dispense Refill   amoxicillin (AMOXIL) 500 MG capsule Take four capsules one hour before dental appointment. 4 capsule 3   atorvastatin (LIPITOR) 10 MG tablet Take 10 mg by mouth daily.     Cholecalciferol (VITAMIN D3 SUPER STRENGTH) 50 MCG (2000 UT) TABS Take 2,000 Units by mouth.     cyanocobalamin 1000 MCG tablet Take 1,000 mcg by mouth daily.     DENTA 5000 PLUS 1.1 % CREA dental cream Place 1 application. onto teeth See admin instructions.     folic acid (FOLVITE) 989 MCG tablet Take 400 mcg by mouth daily.     gabapentin (NEURONTIN) 300 MG capsule Take 300 mg by mouth at bedtime.     No current facility-administered medications on file prior to visit.    ALLERGIES:  No Known Allergies   PHYSICAL EXAM:  There were no vitals filed for this visit. There were no vitals filed for this visit.  Weight Date                     Pre-treatment (RT consult date):    General: Well-nourished, well-appearing male/male*** in no acute distress.  Accompanied/Unaccompanied today.  HEENT: Head is atraumatic and normocephalic.  Pupils equal and reactive to light. Conjunctivae clear without exudate.  Sclerae anicteric. Oral mucosa is pink and moist without lesions.  Tongue pink, moist, and midline. Oropharynx is pink and moist, without lesions. Lymph: No preauricular, postauricular, cervical, supraclavicular, or infraclavicular lymphadenopathy noted  on palpation.   Neck: No palpable masses. Skin on neck is ***.  Cardiovascular: Normal rate and rhythm. Respiratory: Clear to auscultation bilaterally. Chest expansion symmetric without accessory muscle use; breathing non-labored.  GI: Abdomen soft and round. Non-tender, non-distended. Bowel sounds normoactive.  GU: Deferred.   Neuro: No focal deficits. Steady gait.   Psych: Normal mood and affect for situation. Extremities: No edema.  Skin: Warm and dry.    LABORATORY DATA:  None at this visit.***  DIAGNOSTIC IMAGING:  None at this visit.    ASSESSMENT & PLAN:  Patrick Nelson is a pleasant 71 y.o. male/male*** with history of ***, diagnosed in ***(date);  treated with ***; completed treatment on (date).  Patient presents to survivorship clinic today for routine follow-up after finishing treatment.   1. Cancer***:  Patrick Nelson is clinically without evidence of disease or recurrence on physical exam today.     #. Nutritional status: Patrick Nelson reports that he is currently able to consume adequate nutrition by mouth/via tube***.  Weight is stable at *** lbs today.  Encouraged to continue to consume adequate hydration and nutrition, as tolerated.    #. At risk for dysphagia: Given Patrick Nelson's treatment for *** cancer, which included surgery and radiation therapy***, he is at risk for chronic dysphagia.  he reports having difficulty with breads and meats, but is able to consume soft foods and liquids without difficulty.  I encouraged him to continue to perform the swallowing exercises, as directed by Garald Balding, SLP.  If Patrick Nelson requires further swallowing or speech therapy evaluation, I will be happy to place that referral, if needed.  Currently, the patient's reported swallowing concerns are to be expected and stable.    #.  At risk for neck lymphedema:  When patients with head & neck cancers are treated with surgery and/or radiation therapy, there is an associated increased risk of neck  lymphedema.  Patrick Nelson reports that currently he is experiencing what would be considered mild symptoms. he does/does not*** have a neck compression garment and reports wearing/not wearing*** it, as directed.  I encouraged Patrick Nelson to continue to wear the compression garment and practice the massage techniques to reduce the presence of lymphedema.  If his symptoms worsen, I would happy to place a formal referral to physical therapy for further evaluation and treatment.     #.  At risk for hypothyroidism: The thyroid gland is often affected after treatment for head & neck cancer.  Patrick Nelson most recent TSH was normal at *** on (date).  If appropriate, he will be prescribed thyroid supplement with Levothyroxine. We discussed that he will continue to have serial TSH monitoring for at least the next 5 years as part of his routine follow-up and post-cancer treatment care.   #. At risk for tooth decay/dental concerns: After treatment with radiation for head & neck cancers, patients often experience xerostomia which increases their risk of dental caries. Patrick Nelson was encouraged to see his dentist 3-4 times per year. The patient should also continue to use his fluoride trays daily, as directed by dentistry.  I encouraged him to reach out to either Dr. Enrique Sack (oncology dentist) or his primary  care dentist with additional questions or concerns.   #.  Lung cancer screening:   now offers eligible patients lung cancer screening with a low-dose chest CT to aid in early detection, provide more effective treatment options, and ultimately improve survival benefits for patients diagnosed early.  Below is the selection criteria for screening:  Medicare patients: 55-77 years; privately insured patients 55-80 years. Active or former smokers who have quit within the last 15 years. 30+ pack-year history of smoking  Exclusion criteria - No signs/symptoms of lung cancer (i.e., no recent history of hemoptysis  and no unexplained weight loss >15 pounds in the last 6 months). Willing and healthy enough to undergo biopsies/surgery if needed.  Patrick Nelson currently does/does not*** meet criteria for lung cancer screening.  Therefore, I have/have not*** placed a referral for screening today.    #. Tobacco & alcohol use: Patrick Nelson reports that he quit smoking in *** and continues to abstain from all tobacco products.  I congratulated his continued efforts to remain tobacco free.  I also reinforced the importance of avoiding alcohol consumption as well.  Both tobacco and alcohol use in patients with head & neck cancer increases the risk of recurrence.  They also increase the risk of other cancers, as well.  Patrick Nelson states that he voiced understanding of the importance of continuing to remain both tobacco and alcohol-free.    #. Health maintenance and wellness promotion: Cancer patients who consume a diet rich in fruits and vegetables have better overall health and decreased risk of cancer recurrence. Patrick Nelson was encouraged to consume 5-7 servings of fruits and vegetables per day, as tolerated. Patrick Nelson was also encouraged to engage in moderate to vigorous exercise for 30 minutes per day most days of the week.   #. Support services/counseling: It is not uncommon for this period of the patient's cancer care trajectory to be one of many emotions and stressors.  We discussed an opportunity for him to participate in the next session of Head & Neck FYNN ("Finding Your New Normal") support group series, designed for patients after they have completed treatment.   Patrick Nelson was encouraged to take advantage of our many other support services programs, support groups, and/or counseling in coping with his new life as a cancer survivor after completing anti-cancer treatment. The patient was offered support today through active listening and expressive supportive counseling.     Dispo:  -See Dr. Marland Kitchen (ENT) in  ***/2017 -Return to cancer center to see Dr. Isidore Moos in ***/2017 -Return to cancer center to see Survivorship NP in ***.      A total of *** minutes was spent in the face-to-face care of this patient, with greater than 50% of that time spent in counseling and care-coordination.    Cira Rue, NP [place] [phone]

## 2022-05-30 ENCOUNTER — Inpatient Hospital Stay: Payer: Medicare Other | Attending: Nurse Practitioner | Admitting: Nurse Practitioner

## 2022-05-30 ENCOUNTER — Encounter: Payer: Self-pay | Admitting: Nurse Practitioner

## 2022-05-30 ENCOUNTER — Other Ambulatory Visit: Payer: Self-pay

## 2022-05-30 VITALS — BP 151/92 | HR 55 | Temp 97.5°F | Resp 16 | Wt 163.4 lb

## 2022-05-30 DIAGNOSIS — C01 Malignant neoplasm of base of tongue: Secondary | ICD-10-CM

## 2022-05-30 DIAGNOSIS — Z85810 Personal history of malignant neoplasm of tongue: Secondary | ICD-10-CM | POA: Insufficient documentation

## 2022-05-30 DIAGNOSIS — R682 Dry mouth, unspecified: Secondary | ICD-10-CM | POA: Insufficient documentation

## 2022-05-31 ENCOUNTER — Telehealth: Payer: Self-pay | Admitting: Nurse Practitioner

## 2022-05-31 NOTE — Telephone Encounter (Signed)
Left message with follow-up appointment per 8/31 los.

## 2022-06-02 ENCOUNTER — Ambulatory Visit
Admission: RE | Admit: 2022-06-02 | Discharge: 2022-06-02 | Disposition: A | Discharge status: Home / Self Care | Payer: Medicare Other | Source: Ambulatory Visit | Attending: Neurological Surgery | Admitting: Neurological Surgery

## 2022-06-02 DIAGNOSIS — M5416 Radiculopathy, lumbar region: Secondary | ICD-10-CM

## 2022-06-02 DIAGNOSIS — M545 Low back pain, unspecified: Secondary | ICD-10-CM | POA: Diagnosis not present

## 2022-06-02 DIAGNOSIS — M48061 Spinal stenosis, lumbar region without neurogenic claudication: Secondary | ICD-10-CM | POA: Diagnosis not present

## 2022-06-02 DIAGNOSIS — R200 Anesthesia of skin: Secondary | ICD-10-CM | POA: Diagnosis not present

## 2022-06-04 DIAGNOSIS — M1612 Unilateral primary osteoarthritis, left hip: Secondary | ICD-10-CM | POA: Diagnosis not present

## 2022-06-04 DIAGNOSIS — M25552 Pain in left hip: Secondary | ICD-10-CM | POA: Diagnosis not present

## 2022-06-06 DIAGNOSIS — M25652 Stiffness of left hip, not elsewhere classified: Secondary | ICD-10-CM | POA: Diagnosis not present

## 2022-06-06 DIAGNOSIS — M4316 Spondylolisthesis, lumbar region: Secondary | ICD-10-CM | POA: Diagnosis not present

## 2022-06-06 DIAGNOSIS — M5416 Radiculopathy, lumbar region: Secondary | ICD-10-CM | POA: Diagnosis not present

## 2022-06-06 DIAGNOSIS — M1612 Unilateral primary osteoarthritis, left hip: Secondary | ICD-10-CM | POA: Diagnosis not present

## 2022-06-06 DIAGNOSIS — R262 Difficulty in walking, not elsewhere classified: Secondary | ICD-10-CM | POA: Diagnosis not present

## 2022-06-08 ENCOUNTER — Other Ambulatory Visit: Payer: Medicare Other

## 2022-06-11 DIAGNOSIS — Z23 Encounter for immunization: Secondary | ICD-10-CM | POA: Diagnosis not present

## 2022-06-11 DIAGNOSIS — Z01818 Encounter for other preprocedural examination: Secondary | ICD-10-CM | POA: Diagnosis not present

## 2022-06-11 DIAGNOSIS — M169 Osteoarthritis of hip, unspecified: Secondary | ICD-10-CM | POA: Diagnosis not present

## 2022-06-17 ENCOUNTER — Inpatient Hospital Stay: Payer: Medicare Other | Attending: Nurse Practitioner

## 2022-06-17 ENCOUNTER — Inpatient Hospital Stay: Payer: Medicare Other | Admitting: Hematology and Oncology

## 2022-06-17 VITALS — BP 117/88 | HR 61 | Temp 98.1°F | Resp 14 | Wt 160.7 lb

## 2022-06-17 DIAGNOSIS — Z79899 Other long term (current) drug therapy: Secondary | ICD-10-CM | POA: Insufficient documentation

## 2022-06-17 DIAGNOSIS — C01 Malignant neoplasm of base of tongue: Secondary | ICD-10-CM

## 2022-06-17 DIAGNOSIS — Z923 Personal history of irradiation: Secondary | ICD-10-CM | POA: Insufficient documentation

## 2022-06-17 DIAGNOSIS — Z95828 Presence of other vascular implants and grafts: Secondary | ICD-10-CM

## 2022-06-17 DIAGNOSIS — Z85810 Personal history of malignant neoplasm of tongue: Secondary | ICD-10-CM | POA: Diagnosis not present

## 2022-06-17 DIAGNOSIS — Z9221 Personal history of antineoplastic chemotherapy: Secondary | ICD-10-CM | POA: Diagnosis not present

## 2022-06-17 LAB — CMP (CANCER CENTER ONLY)
ALT: 15 U/L (ref 0–44)
AST: 20 U/L (ref 15–41)
Albumin: 4.3 g/dL (ref 3.5–5.0)
Alkaline Phosphatase: 76 U/L (ref 38–126)
Anion gap: 6 (ref 5–15)
BUN: 15 mg/dL (ref 8–23)
CO2: 26 mmol/L (ref 22–32)
Calcium: 9.8 mg/dL (ref 8.9–10.3)
Chloride: 104 mmol/L (ref 98–111)
Creatinine: 1.02 mg/dL (ref 0.61–1.24)
GFR, Estimated: >60 mL/min (ref >60)
Glucose, Bld: 102 mg/dL — ABNORMAL HIGH (ref 70–99)
Potassium: 5.3 mmol/L — ABNORMAL HIGH (ref 3.5–5.1)
Sodium: 136 mmol/L (ref 135–145)
Total Bilirubin: 0.8 mg/dL (ref 0.3–1.2)
Total Protein: 6.5 g/dL (ref 6.5–8.1)

## 2022-06-17 LAB — CBC WITH DIFFERENTIAL (CANCER CENTER ONLY)
Abs Immature Granulocytes: 0.02 10*3/uL (ref 0.00–0.07)
Basophils Absolute: 0 10*3/uL (ref 0.0–0.1)
Basophils Relative: 1 %
Eosinophils Absolute: 0.1 10*3/uL (ref 0.0–0.5)
Eosinophils Relative: 2 %
HCT: 46.6 % (ref 39.0–52.0)
Hemoglobin: 15.8 g/dL (ref 13.0–17.0)
Immature Granulocytes: 1 %
Lymphocytes Relative: 24 %
Lymphs Abs: 1 10*3/uL (ref 0.7–4.0)
MCH: 31.3 pg (ref 26.0–34.0)
MCHC: 33.9 g/dL (ref 30.0–36.0)
MCV: 92.5 fL (ref 80.0–100.0)
Monocytes Absolute: 0.4 10*3/uL (ref 0.1–1.0)
Monocytes Relative: 10 %
Neutro Abs: 2.6 10*3/uL (ref 1.7–7.7)
Neutrophils Relative %: 62 %
Platelet Count: 192 10*3/uL (ref 150–400)
RBC: 5.04 MIL/uL (ref 4.22–5.81)
RDW: 13.2 % (ref 11.5–15.5)
WBC Count: 4.1 10*3/uL (ref 4.0–10.5)
nRBC: 0 % (ref 0.0–0.2)

## 2022-06-17 LAB — TSH: TSH: 3.664 u[IU]/mL (ref 0.350–4.500)

## 2022-06-17 NOTE — Progress Notes (Signed)
[facility] [phone]   [phone]  PROGRESS NOTE  Patient Care Team: Wenda Low, MD as PCP - General (Internal Medicine) Malmfelt, Stephani Police, RN as Oncology Nurse Navigator Eppie Gibson, MD as Consulting Physician (Radiation Oncology) Orson Slick, MD as Consulting Physician (Hematology and Oncology) Sharen Counter, CCC-SLP as Speech Language Pathologist (Speech Pathology) Wynelle Beckmann, Melodie Bouillon, PT as Physical Therapist (Physical Therapy) Beverely Pace, LCSW (Inactive) as Social Worker (General Practice) Karie Mainland, RD as Dietitian (Nutrition) Izora Gala, MD as Consulting Physician (Otolaryngology) Alla Feeling, NP as Nurse Practitioner (Nurse Practitioner)  Hematological/Oncological History 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 Mass biopsied at the right base of the tongue. Biopsy showed invasive moderately differentiated squamous cell carcinoma 3) 06/06/2020: PET CT scan showed hypermetabolic right tongue base mass consistent with known neoplasm. Right level 2 and level 3 metastatic adenopathy. No evidence of metastatic disease.  4) 06/09/2020: established care with Dr. Isidore Moos 5) 06/12/2020: establish care with Dr. Lorenso Courier  6) 07/03/2020: Week 1 of Cisplatin chemoradiation 7) 07/10/2020: Week 2 of Cisplatin chemoradiation 8) 07/17/2020: Week 3 of Cisplatin chemoradiation 9) 07/24/2020: Week 4 of Cisplatin chemoradiation 10) 07/31/2020: Week 5 of Cisplatin chemoradiation 11) 08/07/2020: Week 6 of Cisplatin chemoradiation 12) 08/14/2020: Week 7 of Cisplatin chemoradiation 13) 08/18/2020: end of radiation therapy.  14) 11/24/2020: PET CT scan shows a complete remission, no  FDG avid lesions or concern for residual/recurrent disease.   Interval History:  Patrick Nelson 71 y.o. male with medical history significant for SCC of the base of the tongue who presents for a follow up visit. The patient's last visit was on 12/26/2020 by Dr. Lorenso Courier. In the interim since the last visit he has had no major changes in his health.  At today's exam, Patrick Nelson reports he has been doing quite well interim since her last visit.  He is currently planned for hip replacement on Wednesday.  He reports that it is "overdue by 2 years".  He notes that he has had no issues with swallowing or throat pain.  His appetite has been strong.  He last saw Dr. Constance Holster recently and will be seeing him again in November 2023.  He reports that he does inspection of his mouth during those visits.  He reports that occasionally he has increased mucus production or dry mouth.  He notes his taste buds are only about 80% back and he still has some trouble with spicy foods and other flavors.  Overall he feels quite well and has no questions concerns or complaints today.  He denies fevers, chills, night sweats, shortness of breath, chest pain or cough.  He has no other complaints.  Rest of the 10 point ROS is below.  MEDICAL HISTORY:  Past Medical History:  Diagnosis Date   Hyperlipidemia    Paroxysmal atrial fibrillation ([place])     SURGICAL HISTORY: Past Surgical History:  Procedure Laterality Date   DIRECT LARYNGOSCOPY N/A 05/22/2020   Procedure: DIRECT LARYNGOSCOPY w/BIOPSY TONGUE BASE;  Surgeon: Izora Gala, MD;  Location: [facility];  Service: ENT;  Laterality: N/A;   IR GASTROSTOMY TUBE MOD SED  06/26/2020   IR GASTROSTOMY TUBE REMOVAL  12/25/2020   IR IMAGING GUIDED PORT INSERTION  06/26/2020   IR REMOVAL TUN ACCESS W/ PORT W/O FL MOD SED  12/25/2020   TOTAL HIP ARTHROPLASTY  2012   right hip-Dr. Berenice Primas    SOCIAL HISTORY: Social History   Socioeconomic History   Marital status: Widowed     Spouse name: Not on file   Number of children: 1   Years of education: Not on file   Highest education level: Not on file  Occupational History   Not on file  Tobacco Use   Smoking status: Never   Smokeless tobacco: Never  Vaping Use   Vaping Use: Never used  Substance and Sexual Activity   Alcohol use: Yes   Drug use: No   Sexual activity: Yes  Other Topics Concern   Not on file  Social History Narrative   Not on file   Social Determinants of Health   Financial Resource Strain: Low Risk  (07/20/2020)   Overall Financial Resource Strain (CARDIA)    Difficulty of Paying Living Expenses: Not hard at all  Food Insecurity: No Food Insecurity (07/20/2020)   Hunger Vital Sign    Worried About Running Out of Food in the Last Year: Never true    Country Walk in the Last Year: Never true  Transportation Needs: No Transportation Needs (07/20/2020)   PRAPARE - Hydrologist (Medical): No    Lack of Transportation (Non-Medical): No  Physical Activity: Not on file  Stress: Not on file  Social Connections: Moderately Isolated (07/20/2020)   Social Connection and Isolation Panel [NHANES]    Frequency of Communication with Friends and Family: More than three times a week    Frequency of Social Gatherings with Friends and Family: More than three times a week    Attends Religious Services: Never    Marine scientist or Organizations: No    Attends Music therapist: Not on file    Marital Status: Married  Human resources officer Violence: Not on file    FAMILY HISTORY: Family History  Problem Relation Age of Onset   Hypertension Mother    Heart disease Mother    Heart disease Brother     ALLERGIES:  has No Known Allergies.  MEDICATIONS:  Current Outpatient Medications  Medication Sig Dispense Refill   amoxicillin (AMOXIL) 500 MG capsule Take four capsules one hour before dental appointment. 4 capsule 3   atorvastatin (LIPITOR) 10 MG  tablet Take 10 mg by mouth daily.     Cholecalciferol (VITAMIN D3 SUPER STRENGTH) 50 MCG (2000 UT) TABS Take 2,000 Units by mouth.     cyanocobalamin 1000 MCG tablet Take 1,000 mcg by mouth daily.     DENTA 5000 PLUS 1.1 % CREA dental cream Place 1 application. onto teeth See admin instructions.     folic acid (FOLVITE) 622 MCG tablet Take 400 mcg by mouth daily.     gabapentin (NEURONTIN) 300 MG capsule Take 300 mg by mouth at bedtime.     No current facility-administered medications for this visit.    REVIEW OF SYSTEMS:   Constitutional: ( - ) fevers, ( - )  chills , ( - ) night sweats Eyes: ( - ) blurriness of vision, ( - ) double vision, ( - ) watery eyes Ears, nose, mouth, throat, and face: ( - ) mucositis, ( - ) sore throat Respiratory: ( - ) cough, ( - ) dyspnea, ( - ) wheezes Cardiovascular: ( - )  palpitation, ( - ) chest discomfort, ( - ) lower extremity swelling Gastrointestinal:  ( - ) nausea, ( - ) heartburn, ( - ) change in bowel habits Skin: ( - ) abnormal skin rashes Lymphatics: ( - ) new lymphadenopathy, ( - ) easy bruising Neurological: ( - ) numbness, ( - ) tingling, ( - ) new weaknesses Behavioral/Psych: ( - ) mood change, ( - ) new changes  All other systems were reviewed with the patient and are negative.  PHYSICAL EXAMINATION: ECOG PERFORMANCE STATUS: 0 - Asymptomatic  Vitals:   06/17/22 1149  BP: 117/88  Pulse: 61  Resp: 14  Temp: 98.1 F (36.7 C)  SpO2: 99%   Filed Weights   06/17/22 1149  Weight: 160 lb 11.2 oz (72.9 kg)    GENERAL: well appearing elderly Caucasian male alert, no distress and comfortable SKIN:  Normal tugor, no more erythema around neck. No skin lesions noted. EYES: conjunctiva are pink and non-injected, sclera clear OROPHARYNX: No evidence of ulcer/infection.  LUNGS: clear to auscultation and percussion with normal breathing effort HEART: regular rate & rhythm and no murmurs and no lower extremity edema Musculoskeletal: no  cyanosis of digits and no clubbing  PSYCH: alert & oriented x 3, fluent speech NEURO: no focal motor/sensory deficits  LABORATORY DATA:  I have reviewed the data as listed    Latest Ref Rng & Units 06/17/2022   11:04 AM 12/26/2021    3:06 PM 06/22/2021   10:36 AM  CBC  WBC 4.0 - 10.5 K/uL 4.1  4.3  3.6   Hemoglobin 13.0 - 17.0 g/dL 15.8  13.6  14.2   Hematocrit 39.0 - 52.0 % 46.6  40.5  42.6   Platelets 150 - 400 K/uL 192  177  172        Latest Ref Rng & Units 06/17/2022   11:04 AM 12/26/2021    3:06 PM 06/22/2021   10:36 AM  CMP  Glucose 70 - 99 mg/dL 102  97  107   BUN 8 - 23 mg/dL '15  17  11   '$ Creatinine 0.61 - 1.24 mg/dL 1.02  0.87  0.95   Sodium 135 - 145 mmol/L 136  139  137   Potassium 3.5 - 5.1 mmol/L 5.3  4.1  4.5   Chloride 98 - 111 mmol/L 104  104  103   CO2 22 - 32 mmol/L '26  29  27   '$ Calcium 8.9 - 10.3 mg/dL 9.8  9.0  10.1   Total Protein 6.5 - 8.1 g/dL 6.5  6.2  6.5   Total Bilirubin 0.3 - 1.2 mg/dL 0.8  0.5  0.8   Alkaline Phos 38 - 126 U/L 76  66  87   AST 15 - 41 U/L '20  27  21   '$ ALT 0 - 44 U/L '15  22  16     '$ RADIOGRAPHIC STUDIES: MR LUMBAR SPINE WO CONTRAST  Result Date: 06/04/2022 CLINICAL DATA:  Low back pain radiating to the left leg for 3 months. Left leg numbness and weakness EXAM: MRI LUMBAR SPINE WITHOUT CONTRAST TECHNIQUE: Multiplanar, multisequence MR imaging of the lumbar spine was performed. No intravenous contrast was administered. COMPARISON:  11/22/2020 FINDINGS: Segmentation:  5 lumbar type vertebrae Alignment:  Mild scoliosis and L1-2, L3-4 anterolisthesis Vertebrae:  No fracture, evidence of discitis, or bone lesion. Conus medullaris and cauda equina: Conus extends to the L1-2 level. Conus and cauda equina appear normal. Paraspinal and other soft tissues: Negative for  perispinal mass or inflammation Disc levels: T12- L1: Unremarkable. L1-L2: Mild facet spurring asymmetric to the right. Disc space narrowing and bulging with right foraminal  protrusion also affecting the subarticular recess. Moderate right foraminal and subarticular recess narrowing L2-L3: Disc narrowing and bulging with eccentric left endplate and facet spurring. Left paracentral to foraminal herniation which impinges on the descending left L3 nerve root. L3-L4: Degenerative facet spurring with mild anterolisthesis. The disc is narrowed and bulging with annular fissuring. Moderate left foraminal stenosis. Noncompressive left subarticular recess narrowing L4-L5: Disc space narrowing and bulging with annular fissures. Left foraminal protrusion and facet spurring with left foraminal impingement on axial images. L5-S1:Disc bulging with annular fissuring. Asymmetric right facet spurring. No neural impingement. IMPRESSION: 1. 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 2. L2-3 left paracentral herniation impinging on the left L3 nerve root, new from 2020. 3. L1-2 moderate right foraminal and subarticular recess narrowing, chronic. 4. L3-4 and L4-5 left foraminal impingement, chronic. Electronically Signed   By: Jorje Guild M.D.   On: 06/04/2022 11:44     ASSESSMENT & PLAN Patrick Nelson 71 y.o. male with medical history significant for SCC of the base of the tongue who presents for a follow up visit.  Review the labs, review the records, discussion with the patient the findings are most consistent with a squamous cell carcinoma of the back base of the tongue.  This was a T1 N1 M0 p16 positive cancer which makes it stage I.  Overall the prognosis for this is quite well and there is an excellent chance of durable remission after chemoradiation therapy with cisplatin.   Previously we discussed treatment moving forward and the expected side effects and symptoms.  Side effects include but are not limited to fatigue, anemia, kidney dysfunction, nausea, vomiting, diarrhea, throat pain, weight loss, and neuropathy.  Patient his wife were agreeable to  proceeding with treatment.  We also discussed the medications to help with potential side effects including Zofran as needed, Compazine as needed, and EMLA cream.    The treatment of choice for this patient will be radiation with concurrent cisplatin 40 mg per metered squared weekly x7 doses.  We started this on 07/03/2020 and had weekly visits with the patient while he is receiving this treatment. He completed chemotherapy on 08/14/2020.   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 P16+, Stage I  --patient completed 7 weekly doses of cisplatin '40mg'$ /m2 administered with radiation on 08/18/2020 --PET CT scan on 11/24/2020 shows that the patient is in a complete remission. Proceed with fibro-optic exams with ENT/Rad Onc moving forward.  --Last seen by Dr. Constance Holster, ENT, in August with no evidence of recurrent disease. Plan to recheck in 3 months around Nov 2023.  --Last seen by Dr. Isidore Moos, RadOnc, on 12/04/2021. Plan to return PRN.  --Labs today were reviewed without any intervention needed. CBC shows blood counts are back to normal.  Labs show white blood cell count 4.1, hemoglobin 10.8, MCV 92.5, and platelets of 192 --RTC in 6 months with repeat labs. After that time can see patient q 12 months.   #Supportive Care --PEG and Port removed --erythematous radiation rash resolved with topical treatments.   No orders of the defined types were placed in this encounter.  All questions were answered. The patient knows to call the clinic with any problems, questions or concerns.  I have spent a total of 25 minutes minutes of face-to-face and non-face-to-face time, preparing  to see the patient, performing a medically appropriate examination, counseling and educating the patient, ordering tests, documenting clinical information in the electronic health record,and care coordination.   Ledell Peoples, MD Department of Hematology/Oncology [place] [facility] Phone: [phone] Pager: [phone] Email: [email or web address]    06/18/2022 3:22 PM

## 2022-06-18 ENCOUNTER — Encounter: Payer: Self-pay | Admitting: Hematology and Oncology

## 2022-06-19 DIAGNOSIS — Z96642 Presence of left artificial hip joint: Secondary | ICD-10-CM | POA: Diagnosis not present

## 2022-06-19 DIAGNOSIS — M1612 Unilateral primary osteoarthritis, left hip: Secondary | ICD-10-CM | POA: Diagnosis not present

## 2022-06-24 DIAGNOSIS — M1612 Unilateral primary osteoarthritis, left hip: Secondary | ICD-10-CM | POA: Diagnosis not present

## 2022-06-24 DIAGNOSIS — Z96642 Presence of left artificial hip joint: Secondary | ICD-10-CM | POA: Diagnosis not present

## 2022-06-26 DIAGNOSIS — M1612 Unilateral primary osteoarthritis, left hip: Secondary | ICD-10-CM | POA: Diagnosis not present

## 2022-06-26 DIAGNOSIS — Z96642 Presence of left artificial hip joint: Secondary | ICD-10-CM | POA: Diagnosis not present

## 2022-06-28 ENCOUNTER — Other Ambulatory Visit: Payer: Medicare Other

## 2022-06-28 ENCOUNTER — Ambulatory Visit: Payer: Medicare Other | Admitting: Hematology and Oncology

## 2022-07-01 DIAGNOSIS — M1612 Unilateral primary osteoarthritis, left hip: Secondary | ICD-10-CM | POA: Diagnosis not present

## 2022-07-01 DIAGNOSIS — Z96642 Presence of left artificial hip joint: Secondary | ICD-10-CM | POA: Diagnosis not present

## 2022-07-02 DIAGNOSIS — Z96642 Presence of left artificial hip joint: Secondary | ICD-10-CM | POA: Diagnosis not present

## 2022-07-03 DIAGNOSIS — Z96642 Presence of left artificial hip joint: Secondary | ICD-10-CM | POA: Diagnosis not present

## 2022-07-03 DIAGNOSIS — M1612 Unilateral primary osteoarthritis, left hip: Secondary | ICD-10-CM | POA: Diagnosis not present

## 2022-07-08 DIAGNOSIS — M1612 Unilateral primary osteoarthritis, left hip: Secondary | ICD-10-CM | POA: Diagnosis not present

## 2022-07-08 DIAGNOSIS — M5416 Radiculopathy, lumbar region: Secondary | ICD-10-CM | POA: Diagnosis not present

## 2022-07-08 DIAGNOSIS — Z96642 Presence of left artificial hip joint: Secondary | ICD-10-CM | POA: Diagnosis not present

## 2022-07-09 DIAGNOSIS — M25552 Pain in left hip: Secondary | ICD-10-CM | POA: Diagnosis not present

## 2022-07-09 DIAGNOSIS — M25522 Pain in left elbow: Secondary | ICD-10-CM | POA: Diagnosis not present

## 2022-07-09 DIAGNOSIS — M25562 Pain in left knee: Secondary | ICD-10-CM | POA: Diagnosis not present

## 2022-07-11 DIAGNOSIS — M1612 Unilateral primary osteoarthritis, left hip: Secondary | ICD-10-CM | POA: Diagnosis not present

## 2022-07-11 DIAGNOSIS — Z96642 Presence of left artificial hip joint: Secondary | ICD-10-CM | POA: Diagnosis not present

## 2022-07-15 DIAGNOSIS — Z96642 Presence of left artificial hip joint: Secondary | ICD-10-CM | POA: Diagnosis not present

## 2022-07-15 DIAGNOSIS — M1612 Unilateral primary osteoarthritis, left hip: Secondary | ICD-10-CM | POA: Diagnosis not present

## 2022-07-18 DIAGNOSIS — Z96642 Presence of left artificial hip joint: Secondary | ICD-10-CM | POA: Diagnosis not present

## 2022-07-18 DIAGNOSIS — M1612 Unilateral primary osteoarthritis, left hip: Secondary | ICD-10-CM | POA: Diagnosis not present

## 2022-07-23 DIAGNOSIS — M5416 Radiculopathy, lumbar region: Secondary | ICD-10-CM | POA: Diagnosis not present

## 2022-08-08 DIAGNOSIS — M1712 Unilateral primary osteoarthritis, left knee: Secondary | ICD-10-CM | POA: Diagnosis not present

## 2022-08-13 DIAGNOSIS — C109 Malignant neoplasm of oropharynx, unspecified: Secondary | ICD-10-CM | POA: Diagnosis not present

## 2022-08-13 DIAGNOSIS — Z923 Personal history of irradiation: Secondary | ICD-10-CM | POA: Diagnosis not present

## 2022-12-12 DIAGNOSIS — Z923 Personal history of irradiation: Secondary | ICD-10-CM | POA: Diagnosis not present

## 2022-12-12 DIAGNOSIS — C109 Malignant neoplasm of oropharynx, unspecified: Secondary | ICD-10-CM | POA: Diagnosis not present

## 2022-12-15 ENCOUNTER — Other Ambulatory Visit: Payer: Self-pay | Admitting: Hematology and Oncology

## 2022-12-15 DIAGNOSIS — C01 Malignant neoplasm of base of tongue: Secondary | ICD-10-CM

## 2022-12-15 NOTE — Progress Notes (Unsigned)
[place] [phone]   [phone]  PROGRESS NOTE  Patient Care Team: Wenda Low, MD as PCP - General (Internal Medicine) Malmfelt, Stephani Police, RN as Oncology Nurse Navigator Eppie Gibson, MD as Consulting Physician (Radiation Oncology) Orson Slick, MD as Consulting Physician (Hematology and Oncology) Sharen Counter, CCC-SLP as Speech Language Pathologist (Speech Pathology) Wynelle Beckmann, Melodie Bouillon, PT as Physical Therapist (Physical Therapy) Beverely Pace, LCSW (Inactive) as Social Worker (General Practice) Karie Mainland, RD as Dietitian (Nutrition) Izora Gala, MD as Consulting Physician (Otolaryngology) Alla Feeling, NP as Nurse Practitioner (Nurse Practitioner)  Hematological/Oncological History 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 Mass biopsied at the right base of the tongue. Biopsy showed invasive moderately differentiated squamous cell carcinoma 3) 06/06/2020: PET CT scan showed hypermetabolic right tongue base mass consistent with known neoplasm. Right level 2 and level 3 metastatic adenopathy. No evidence of metastatic disease.  4) 06/09/2020: established care with Dr. Isidore Moos 5) 06/12/2020: establish care with Dr. Lorenso Courier  6) 07/03/2020: Week 1 of Cisplatin chemoradiation 7) 07/10/2020: Week 2 of Cisplatin chemoradiation 8) 07/17/2020: Week 3 of Cisplatin chemoradiation 9) 07/24/2020: Week 4 of Cisplatin chemoradiation 10) 07/31/2020: Week 5 of Cisplatin chemoradiation 11) 08/07/2020: Week 6 of Cisplatin chemoradiation 12) 08/14/2020: Week 7 of Cisplatin chemoradiation 13) 08/18/2020: end of radiation therapy.  14) 11/24/2020: PET CT scan shows a complete remission, no  FDG avid lesions or concern for residual/recurrent disease.   Interval History:  Patrick Nelson 72 y.o. male with medical history significant for SCC of the base of the tongue who presents for a follow up visit. The patient's last visit was on 06/17/2022. In the interim since the last visit he has had no major changes in his health.  At today's exam, Mr. Ponce reports he has been well overall in the 6 months since her last visit.  He has had no major change in his health and reports that he has been recovering "slow and steady".  His appetite is good and his weight is steady.  He saw Dr. Constance Holster last week and reports that everything "looked good".  His next follow-up will be in 4 months time.  He is not having any soreness in his throat or hoarseness.  He reports that his neck can be stiff some mornings.  His energy levels are good and he has been golfing.  He notes that he does not have taste back for bread or grains for the most part his taste has normalized.Marland Kitchen  He denies fevers, chills, night sweats, shortness of breath, chest pain or cough.  He has no other complaints.  Rest of the 10 point ROS is below.  MEDICAL HISTORY:  Past Medical History:  Diagnosis Date   Hyperlipidemia    Paroxysmal atrial fibrillation ([place])     SURGICAL HISTORY: Past Surgical History:  Procedure Laterality Date   DIRECT LARYNGOSCOPY N/A 05/22/2020   Procedure: DIRECT LARYNGOSCOPY w/BIOPSY TONGUE BASE;  Surgeon: Izora Gala, MD;  Location: [facility];  Service: ENT;  Laterality: N/A;   IR GASTROSTOMY TUBE MOD SED  06/26/2020   IR GASTROSTOMY TUBE REMOVAL  12/25/2020   IR IMAGING GUIDED PORT INSERTION  06/26/2020  IR REMOVAL TUN ACCESS W/ PORT W/O FL MOD SED  12/25/2020   TOTAL HIP ARTHROPLASTY  2012   right hip-Dr. Berenice Primas    SOCIAL HISTORY: Social History   Socioeconomic History   Marital status: Widowed    Spouse name: Not on file   Number of children: 1   Years of education: Not on file    Highest education level: Not on file  Occupational History   Not on file  Tobacco Use   Smoking status: Never   Smokeless tobacco: Never  Vaping Use   Vaping Use: Never used  Substance and Sexual Activity   Alcohol use: Yes   Drug use: No   Sexual activity: Yes  Other Topics Concern   Not on file  Social History Narrative   Not on file   Social Determinants of Health   Financial Resource Strain: Low Risk  (07/20/2020)   Overall Financial Resource Strain (CARDIA)    Difficulty of Paying Living Expenses: Not hard at all  Food Insecurity: No Food Insecurity (07/20/2020)   Hunger Vital Sign    Worried About Running Out of Food in the Last Year: Never true    [place] in the Last Year: Never true  Transportation Needs: No Transportation Needs (07/20/2020)   PRAPARE - Hydrologist (Medical): No    Lack of Transportation (Non-Medical): No  Physical Activity: Not on file  Stress: Not on file  Social Connections: Moderately Isolated (07/20/2020)   Social Connection and Isolation Panel [NHANES]    Frequency of Communication with Friends and Family: More than three times a week    Frequency of Social Gatherings with Friends and Family: More than three times a week    Attends Religious Services: Never    Marine scientist or Organizations: No    Attends Music therapist: Not on file    Marital Status: Married  Human resources officer Violence: Not on file    FAMILY HISTORY: Family History  Problem Relation Age of Onset   Hypertension Mother    Heart disease Mother    Heart disease Brother     ALLERGIES:  has No Known Allergies.  MEDICATIONS:  Current Outpatient Medications  Medication Sig Dispense Refill   atorvastatin (LIPITOR) 10 MG tablet Take 10 mg by mouth daily.     Cholecalciferol (VITAMIN D3 SUPER STRENGTH) 50 MCG (2000 UT) TABS Take 2,000 Units by mouth.     cyanocobalamin 1000 MCG tablet Take 1,000 mcg by mouth  daily.     DENTA 5000 PLUS 1.1 % CREA dental cream Place 1 application. onto teeth See admin instructions.     folic acid (FOLVITE) A999333 MCG tablet Take 400 mcg by mouth daily.     amoxicillin (AMOXIL) 500 MG capsule Take four capsules one hour before dental appointment. (Patient not taking: Reported on 12/16/2022) 4 capsule 3   No current facility-administered medications for this visit.    REVIEW OF SYSTEMS:   Constitutional: ( - ) fevers, ( - )  chills , ( - ) night sweats Eyes: ( - ) blurriness of vision, ( - ) double vision, ( - ) watery eyes Ears, nose, mouth, throat, and face: ( - ) mucositis, ( - ) sore throat Respiratory: ( - ) cough, ( - ) dyspnea, ( - ) wheezes Cardiovascular: ( - ) palpitation, ( - ) chest discomfort, ( - ) lower extremity swelling Gastrointestinal:  ( - ) nausea, ( - )  heartburn, ( - ) change in bowel habits Skin: ( - ) abnormal skin rashes Lymphatics: ( - ) new lymphadenopathy, ( - ) easy bruising Neurological: ( - ) numbness, ( - ) tingling, ( - ) new weaknesses Behavioral/Psych: ( - ) mood change, ( - ) new changes  All other systems were reviewed with the patient and are negative.  PHYSICAL EXAMINATION: ECOG PERFORMANCE STATUS: 0 - Asymptomatic  Vitals:   12/16/22 1107  BP: 130/89  Pulse: 72  Resp: 15  Temp: 97.9 F (36.6 C)  SpO2: 99%    Filed Weights   12/16/22 1107  Weight: 165 lb (74.8 kg)     GENERAL: well appearing elderly Caucasian male alert, no distress and comfortable SKIN:  Normal tugor, no more erythema around neck. No skin lesions noted. EYES: conjunctiva are pink and non-injected, sclera clear OROPHARYNX: No evidence of ulcer/infection.  LUNGS: clear to auscultation and percussion with normal breathing effort HEART: regular rate & rhythm and no murmurs and no lower extremity edema Musculoskeletal: no cyanosis of digits and no clubbing  PSYCH: alert & oriented x 3, fluent speech NEURO: no focal motor/sensory  deficits  LABORATORY DATA:  I have reviewed the data as listed    Latest Ref Rng & Units 12/16/2022   10:38 AM 06/17/2022   11:04 AM 12/26/2021    3:06 PM  CBC  WBC 4.0 - 10.5 K/uL 5.5  4.1  4.3   Hemoglobin 13.0 - 17.0 g/dL 14.7  15.8  13.6   Hematocrit 39.0 - 52.0 % 43.3  46.6  40.5   Platelets 150 - 400 K/uL 233  192  177        Latest Ref Rng & Units 12/16/2022   10:38 AM 06/17/2022   11:04 AM 12/26/2021    3:06 PM  CMP  Glucose 70 - 99 mg/dL 105  102  97   BUN 8 - 23 mg/dL 16  15  17    Creatinine 0.61 - 1.24 mg/dL 1.00  1.02  0.87   Sodium 135 - 145 mmol/L 138  136  139   Potassium 3.5 - 5.1 mmol/L 4.5  5.3  4.1   Chloride 98 - 111 mmol/L 104  104  104   CO2 22 - 32 mmol/L 28  26  29    Calcium 8.9 - 10.3 mg/dL 9.6  9.8  9.0   Total Protein 6.5 - 8.1 g/dL 6.5  6.5  6.2   Total Bilirubin 0.3 - 1.2 mg/dL 0.7  0.8  0.5   Alkaline Phos 38 - 126 U/L 78  76  66   AST 15 - 41 U/L 18  20  27    ALT 0 - 44 U/L 12  15  22      RADIOGRAPHIC STUDIES: No results found.   ASSESSMENT & PLAN Patrick Nelson 72 y.o. male with medical history significant for SCC of the base of the tongue who presents for a follow up visit.  Review the labs, review the records, discussion with the patient the findings are most consistent with a squamous cell carcinoma of the back base of the tongue.  This was a T1 N1 M0 p16 positive cancer which makes it stage I.  Overall the prognosis for this is quite well and there is an excellent chance of durable remission after chemoradiation therapy with cisplatin.   Previously we discussed treatment moving forward and the expected side effects and symptoms.  Side effects include but are not limited to fatigue,  anemia, kidney dysfunction, nausea, vomiting, diarrhea, throat pain, weight loss, and neuropathy.  Patient his wife were agreeable to proceeding with treatment.  We also discussed the medications to help with potential side effects including Zofran as needed,  Compazine as needed, and EMLA cream.    The treatment of choice for this patient will be radiation with concurrent cisplatin 40 mg per metered squared weekly x7 doses.  We started this on 07/03/2020 and had weekly visits with the patient while he is receiving this treatment. He completed chemotherapy on 08/14/2020.   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 P16+, Stage I  --patient completed 7 weekly doses of cisplatin 40mg /m2 administered with radiation on 08/18/2020 --PET CT scan on 11/24/2020 shows that the patient is in a complete remission. Proceed with fibro-optic exams with ENT/Rad Onc moving forward.  --Last seen by Dr. Constance Holster, ENT, on 12/12/2022 with no evidence of recurrent disease. Planning for RTC in 4 months time.  --Last seen by Dr. Isidore Moos, RadOnc, on 12/04/2021. Plan to return PRN.  --Labs today were reviewed without any intervention needed. CBC shows blood counts are back to normal.  Labs show white blood cell count 5.5, hemoglobin 14.7, MCV 91, and platelets of 233.  Creatinine 1.00 --RTC in 12 months.   #Supportive Care --PEG and Port removed --erythematous radiation rash resolved with topical treatments.   No orders of the defined types were placed in this encounter.  All questions were answered. The patient knows to call the clinic with any problems, questions or concerns.  I have spent a total of 25 minutes minutes of face-to-face and non-face-to-face time, preparing to see the patient, performing a medically appropriate examination, counseling and educating the patient, ordering tests, documenting clinical information in the electronic health record,and care coordination.   Ledell Peoples, MD Department of Hematology/Oncology [facility] Phone: [phone] Pager: [phone] Email: [email or web address]    12/16/2022 12:52 PM

## 2022-12-16 ENCOUNTER — Inpatient Hospital Stay: Payer: Medicare Other | Attending: Hematology and Oncology

## 2022-12-16 ENCOUNTER — Inpatient Hospital Stay (HOSPITAL_BASED_OUTPATIENT_CLINIC_OR_DEPARTMENT_OTHER): Payer: Medicare Other | Admitting: Hematology and Oncology

## 2022-12-16 VITALS — BP 130/89 | HR 72 | Temp 97.9°F | Resp 15 | Ht 72.0 in | Wt 165.0 lb

## 2022-12-16 DIAGNOSIS — Z95828 Presence of other vascular implants and grafts: Secondary | ICD-10-CM | POA: Diagnosis not present

## 2022-12-16 DIAGNOSIS — Z79899 Other long term (current) drug therapy: Secondary | ICD-10-CM | POA: Diagnosis not present

## 2022-12-16 DIAGNOSIS — I480 Paroxysmal atrial fibrillation: Secondary | ICD-10-CM | POA: Diagnosis not present

## 2022-12-16 DIAGNOSIS — C01 Malignant neoplasm of base of tongue: Secondary | ICD-10-CM | POA: Diagnosis not present

## 2022-12-16 DIAGNOSIS — Z923 Personal history of irradiation: Secondary | ICD-10-CM | POA: Insufficient documentation

## 2022-12-16 DIAGNOSIS — E785 Hyperlipidemia, unspecified: Secondary | ICD-10-CM | POA: Insufficient documentation

## 2022-12-16 DIAGNOSIS — Z9221 Personal history of antineoplastic chemotherapy: Secondary | ICD-10-CM | POA: Insufficient documentation

## 2022-12-16 LAB — CBC WITH DIFFERENTIAL (CANCER CENTER ONLY)
Abs Immature Granulocytes: 0.03 10*3/uL (ref 0.00–0.07)
Basophils Absolute: 0 10*3/uL (ref 0.0–0.1)
Basophils Relative: 1 %
Eosinophils Absolute: 0.1 10*3/uL (ref 0.0–0.5)
Eosinophils Relative: 1 %
HCT: 43.3 % (ref 39.0–52.0)
Hemoglobin: 14.7 g/dL (ref 13.0–17.0)
Immature Granulocytes: 1 %
Lymphocytes Relative: 22 %
Lymphs Abs: 1.2 10*3/uL (ref 0.7–4.0)
MCH: 30.9 pg (ref 26.0–34.0)
MCHC: 33.9 g/dL (ref 30.0–36.0)
MCV: 91 fL (ref 80.0–100.0)
Monocytes Absolute: 0.5 10*3/uL (ref 0.1–1.0)
Monocytes Relative: 8 %
Neutro Abs: 3.7 10*3/uL (ref 1.7–7.7)
Neutrophils Relative %: 67 %
Platelet Count: 233 10*3/uL (ref 150–400)
RBC: 4.76 MIL/uL (ref 4.22–5.81)
RDW: 13.2 % (ref 11.5–15.5)
WBC Count: 5.5 10*3/uL (ref 4.0–10.5)
nRBC: 0 % (ref 0.0–0.2)

## 2022-12-16 LAB — CMP (CANCER CENTER ONLY)
ALT: 12 U/L (ref 0–44)
AST: 18 U/L (ref 15–41)
Albumin: 4 g/dL (ref 3.5–5.0)
Alkaline Phosphatase: 78 U/L (ref 38–126)
Anion gap: 6 (ref 5–15)
BUN: 16 mg/dL (ref 8–23)
CO2: 28 mmol/L (ref 22–32)
Calcium: 9.6 mg/dL (ref 8.9–10.3)
Chloride: 104 mmol/L (ref 98–111)
Creatinine: 1 mg/dL (ref 0.61–1.24)
GFR, Estimated: >60 mL/min (ref >60)
Glucose, Bld: 105 mg/dL — ABNORMAL HIGH (ref 70–99)
Potassium: 4.5 mmol/L (ref 3.5–5.1)
Sodium: 138 mmol/L (ref 135–145)
Total Bilirubin: 0.7 mg/dL (ref 0.3–1.2)
Total Protein: 6.5 g/dL (ref 6.5–8.1)

## 2022-12-16 LAB — TSH: TSH: 4.031 u[IU]/mL (ref 0.350–4.500)

## 2022-12-17 ENCOUNTER — Telehealth: Payer: Self-pay | Admitting: Hematology and Oncology

## 2022-12-17 LAB — T4: T4, Total: 5.7 ug/dL (ref 4.5–12.0)

## 2022-12-17 NOTE — Telephone Encounter (Signed)
Called patient per 3/18 los notes to schedule f/u. Patient scheduled and notified.

## 2022-12-30 DIAGNOSIS — M25552 Pain in left hip: Secondary | ICD-10-CM | POA: Diagnosis not present

## 2023-01-29 DIAGNOSIS — H02834 Dermatochalasis of left upper eyelid: Secondary | ICD-10-CM | POA: Diagnosis not present

## 2023-01-29 DIAGNOSIS — H524 Presbyopia: Secondary | ICD-10-CM | POA: Diagnosis not present

## 2023-01-29 DIAGNOSIS — H35363 Drusen (degenerative) of macula, bilateral: Secondary | ICD-10-CM | POA: Diagnosis not present

## 2023-01-29 DIAGNOSIS — H2513 Age-related nuclear cataract, bilateral: Secondary | ICD-10-CM | POA: Diagnosis not present

## 2023-01-29 DIAGNOSIS — H11153 Pinguecula, bilateral: Secondary | ICD-10-CM | POA: Diagnosis not present

## 2023-01-29 DIAGNOSIS — H5203 Hypermetropia, bilateral: Secondary | ICD-10-CM | POA: Diagnosis not present

## 2023-01-29 DIAGNOSIS — H02831 Dermatochalasis of right upper eyelid: Secondary | ICD-10-CM | POA: Diagnosis not present

## 2023-01-29 DIAGNOSIS — H52203 Unspecified astigmatism, bilateral: Secondary | ICD-10-CM | POA: Diagnosis not present

## 2023-02-05 ENCOUNTER — Other Ambulatory Visit: Payer: Self-pay | Admitting: Hematology and Oncology

## 2023-02-17 DIAGNOSIS — R310 Gross hematuria: Secondary | ICD-10-CM | POA: Diagnosis not present

## 2023-02-25 ENCOUNTER — Other Ambulatory Visit: Payer: Self-pay | Admitting: Internal Medicine

## 2023-02-25 DIAGNOSIS — I712 Thoracic aortic aneurysm, without rupture, unspecified: Secondary | ICD-10-CM | POA: Diagnosis not present

## 2023-02-25 DIAGNOSIS — Z9181 History of falling: Secondary | ICD-10-CM | POA: Diagnosis not present

## 2023-02-25 DIAGNOSIS — R3129 Other microscopic hematuria: Secondary | ICD-10-CM | POA: Diagnosis not present

## 2023-02-25 DIAGNOSIS — C029 Malignant neoplasm of tongue, unspecified: Secondary | ICD-10-CM | POA: Diagnosis not present

## 2023-02-25 DIAGNOSIS — R030 Elevated blood-pressure reading, without diagnosis of hypertension: Secondary | ICD-10-CM | POA: Diagnosis not present

## 2023-02-25 DIAGNOSIS — Z0000 Encounter for general adult medical examination without abnormal findings: Secondary | ICD-10-CM | POA: Diagnosis not present

## 2023-02-25 DIAGNOSIS — R7303 Prediabetes: Secondary | ICD-10-CM | POA: Diagnosis not present

## 2023-02-25 DIAGNOSIS — Z1389 Encounter for screening for other disorder: Secondary | ICD-10-CM | POA: Diagnosis not present

## 2023-02-25 DIAGNOSIS — I700 Atherosclerosis of aorta: Secondary | ICD-10-CM | POA: Diagnosis not present

## 2023-02-25 DIAGNOSIS — E7800 Pure hypercholesterolemia, unspecified: Secondary | ICD-10-CM | POA: Diagnosis not present

## 2023-03-05 ENCOUNTER — Telehealth: Payer: Self-pay | Admitting: Cardiovascular Disease

## 2023-03-05 DIAGNOSIS — I10 Essential (primary) hypertension: Secondary | ICD-10-CM | POA: Diagnosis not present

## 2023-03-05 DIAGNOSIS — I4892 Unspecified atrial flutter: Secondary | ICD-10-CM | POA: Diagnosis not present

## 2023-03-05 NOTE — Telephone Encounter (Signed)
Dr. Donette Larry called to speak with DOD, Dr. [name] regarding pt. Pt just happens to be a pt of Dr. [name] last seen 12/23/14. Pt is being seen by Dr. Donette Larry today because of increased blood pressure and was discovered to be in a-flutter per EKG done in office. Per Dr. [name], recommended that pt be started on blood pressure medication and DOAC. Per Dr. [name] pt should be seen back in the office within the next week.   Spoke with pt, scheduled him to see Dr. [name] in the office 6/12 at 9:30am. Pt will start Eliquis per Dr. Donette Larry and amlodipine/benazepril. Pt will continue to monitor his blood pressure at home and bring log with him to office visit. Pt verbalizes understanding.

## 2023-03-12 ENCOUNTER — Ambulatory Visit: Payer: Medicare Other | Attending: Cardiovascular Disease | Admitting: Cardiovascular Disease

## 2023-03-12 ENCOUNTER — Encounter: Payer: Self-pay | Admitting: Cardiovascular Disease

## 2023-03-12 VITALS — BP 120/86 | HR 127 | Ht 72.0 in | Wt 158.2 lb

## 2023-03-12 DIAGNOSIS — E782 Mixed hyperlipidemia: Secondary | ICD-10-CM

## 2023-03-12 DIAGNOSIS — I10 Essential (primary) hypertension: Secondary | ICD-10-CM

## 2023-03-12 DIAGNOSIS — I7121 Aneurysm of the ascending aorta, without rupture: Secondary | ICD-10-CM

## 2023-03-12 DIAGNOSIS — I480 Paroxysmal atrial fibrillation: Secondary | ICD-10-CM

## 2023-03-12 DIAGNOSIS — I712 Thoracic aortic aneurysm, without rupture, unspecified: Secondary | ICD-10-CM | POA: Insufficient documentation

## 2023-03-12 MED ORDER — APIXABAN 5 MG PO TABS: 5.0000 mg | ORAL_TABLET | Freq: Two times a day (BID) | ORAL | 1 refills | Status: DC

## 2023-03-12 NOTE — Assessment & Plan Note (Signed)
History of hyperlipidemia on atorvastatin 10 mg a day with lipid profile performed 02/25/2023 revealing total cholesterol 158, LDL 93 and HDL of 52.  I am going to get a coronary calcium score to further evaluate and risk stratify.

## 2023-03-12 NOTE — H&P (View-Only) (Signed)
   03/12/2023 Patrick Nelson   10/16/1950  [id number]  Primary Physician Husain, Karrar, MD Primary Cardiologist: Makylah Bossard J Rami Budhu MD FACP, FACC, FAHA, FSCAI  HPI:  Patrick Nelson is a 71 y.o.  widowed Caucasian male father of one child, a grandfather with 3 grandchildren referred by Dr. Karrar Husain  at [facility] for evaluation of one episode of paroxysmal atrial fibrillation. I last saw him in the office 12/23/2014.  He is accompanied by his girlfriend Patrick Nelson today.  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  Mr. Patrick Nelson has no cardiovascular risk factors other than hyperlipidemia on statin therapy. He has never had a heart attack or stroke. There is no family history. He denies chest pain or shortness of breath. He had a viral illness possible month ago and was seen in urgent care at which time he was noted to have an irregular heart rhythm and EKG apparently showed atrial fibrillation. This actually spontaneously converted to sinus rhythm and has not recurred until recently when he was hospitalized with pneumonia and had PAF as well which converted spontaneously. Chest x-ray showed a large left sternal border and a CT abdomen was suggested by radiology to rule out upper echo abdominal aneurysm. He is recovered from his pneumonia and feels clinically well. He is active and exercises on a daily basis.    Since I saw him 8 years ago he did have throat cancer and underwent chemotherapy and radiation therapy 3 years ago and apparently has "never bounced back".  He saw his PCP a week ago at which time his blood pressure was elevated and he was found to be in a flutter.  He was placed on antihypertensive medications and be again on Eliquis oral anticoagulation.  He did notice some hematuria and apparently is seeing a urologist as well.  He is very active, and denies chest pain or shortness of breath. Current Meds  Medication Sig    amLODipine-benazepril (LOTREL) 5-10 MG capsule 1 capsule.   amoxicillin (AMOXIL) 500 MG capsule Take four capsules one hour before dental appointment.   apixaban (ELIQUIS) 5 MG TABS tablet Take 5 mg by mouth 2 (two) times daily.   atorvastatin (LIPITOR) 10 MG tablet Take 10 mg by mouth daily.   Cholecalciferol (VITAMIN D3 SUPER STRENGTH) 50 MCG (2000 UT) TABS Take 2,000 Units by mouth.   cyanocobalamin 1000 MCG tablet Take 1,000 mcg by mouth daily.   DENTA 5000 PLUS 1.1 % CREA dental cream Place 1 application. onto teeth See admin instructions.   folic acid (FOLVITE) 400 MCG tablet Take 400 mcg by mouth daily.     No Known Allergies  Social History   Socioeconomic History   Marital status: Widowed    Spouse name: Not on file   Number of children: 1   Years of education: Not on file   Highest education level: Not on file  Occupational History   Not on file  Tobacco Use   Smoking status: Never   Smokeless tobacco: Never  Vaping Use   Vaping Use: Never used  Substance and Sexual Activity   Alcohol use: Yes   Drug use: No   Sexual activity: Yes  Other Topics Concern   Not on file  Social History Narrative   Not on file   Social Determinants of Health   Financial Resource Strain: Low Risk  (07/20/2020)   Overall Financial Resource Strain (CARDIA)    Difficulty of Paying   Living Expenses: Not hard at all  Food Insecurity: No Food Insecurity (07/20/2020)   Hunger Vital Sign    Worried About Running Out of Food in the Last Year: Never true    Ran Out of Food in the Last Year: Never true  Transportation Needs: No Transportation Needs (07/20/2020)   PRAPARE - Transportation    Lack of Transportation (Medical): No    Lack of Transportation (Non-Medical): No  Physical Activity: Not on file  Stress: Not on file  Social Connections: Moderately Isolated (07/20/2020)   Social Connection and Isolation Panel [NHANES]    Frequency of Communication with Friends and Family: More than  three times a week    Frequency of Social Gatherings with Friends and Family: More than three times a week    Attends Religious Services: Never    Active Member of Clubs or Organizations: No    Attends Club or Organization Meetings: Not on file    Marital Status: Married  Intimate Partner Violence: Not on file     Review of Systems: General: negative for chills, fever, night sweats or weight changes.  Cardiovascular: negative for chest pain, dyspnea on exertion, edema, orthopnea, palpitations, paroxysmal nocturnal dyspnea or shortness of breath Dermatological: negative for rash Respiratory: negative for cough or wheezing Urologic: negative for hematuria Abdominal: negative for nausea, vomiting, diarrhea, bright red blood per rectum, melena, or hematemesis Neurologic: negative for visual changes, syncope, or dizziness All other systems reviewed and are otherwise negative except as noted above.    Blood pressure 120/86, pulse (!) 127, height 6' (1.829 m), weight 158 lb 3.2 oz (71.8 kg), SpO2 98 %.  General appearance: alert and no distress Neck: no adenopathy, no carotid bruit, no JVD, supple, symmetrical, trachea midline, and thyroid not enlarged, symmetric, no tenderness/mass/nodules Lungs: clear to auscultation bilaterally Heart: irregularly irregular rhythm Extremities: extremities normal, atraumatic, no cyanosis or edema Pulses: 2+ and symmetric Skin: Skin color, texture, turgor normal. No rashes or lesions Neurologic: Grossly normal  EKG atrial flutter with 3 1 conduction and a ventricular sponsor 127.  There was left axis deviation.  I personally reviewed this EKG.  ASSESSMENT AND PLAN:   Paroxysmal atrial fibrillation ([facility]) History of PAF dating back to 2016 when he had several episodes and converted spontaneously to sinus rhythm.  He was found to be in a flutter a week ago by his primary care provider.  He was placed on Eliquis oral anticoagulation.  Plan will be for  outpatient DC cardioversion in 3 weeks.  His thyroid function tests were normal.The CHA2DSVASC2 score is  2 .   Hyperlipidemia History of hyperlipidemia on atorvastatin 10 mg a day with lipid profile performed 02/25/2023 revealing total cholesterol 158, LDL 93 and HDL of 52.  I am going to get a coronary calcium score to further evaluate and risk stratify.  Thoracic aortic aneurysm (HCC) History of small thoracic aortic aneurysm assessed annually by CT angiogram.  This was performed most recently 03/14/2022 revealing dimensions to be 40 to millimeters.  This is followed on an annual basis.  Essential hypertension Recent diagnosis of essential hypertension blood pressure measured at 120/86.  He was recently begun on Lotrel with improvement in his blood pressure readings.     Ronneisha Jett J. Augustus Zurawski MD FACP,FACC,FAHA, FSCAI 03/12/2023 10:08 AM 

## 2023-03-12 NOTE — Assessment & Plan Note (Signed)
Recent diagnosis of essential hypertension blood pressure measured at 120/86.  He was recently begun on Lotrel with improvement in his blood pressure readings.

## 2023-03-12 NOTE — Progress Notes (Signed)
03/12/2023 Patrick Nelson   12-28-1950  [id number]  Primary Physician Patrick Housekeeper, MD Primary Cardiologist: Patrick Gess MD Patrick Nelson, [place]  HPI:  Patrick Nelson is a 72 y.o.  widowed Caucasian male father of one child, a grandfather with 3 grandchildren referred by Dr. Georgann Nelson  at [facility] medical for evaluation of one episode of paroxysmal atrial fibrillation. I last saw him in the office 12/23/2014.  He is accompanied by his girlfriend Patrick Nelson today.  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  Patrick Nelson has no cardiovascular risk factors other than hyperlipidemia on statin therapy. He has never had a heart attack or stroke. There is no family history. He denies chest pain or shortness of breath. He had a viral illness possible month ago and was seen in urgent care at which time he was noted to have an irregular heart rhythm and EKG apparently showed atrial fibrillation. This actually spontaneously converted to sinus rhythm and has not recurred until recently when he was hospitalized with pneumonia and had PAF as well which converted spontaneously. Chest x-ray showed a large left sternal border and a CT abdomen was suggested by radiology to rule out upper echo abdominal aneurysm. He is recovered from his pneumonia and feels clinically well. He is active and exercises on a daily basis.    Since I saw him 8 years ago he did have throat cancer and underwent chemotherapy and radiation therapy 3 years ago and apparently has "never bounced back".  He saw his PCP a week ago at which time his blood pressure was elevated and he was found to be in a flutter.  He was placed on antihypertensive medications and be again on Eliquis oral anticoagulation.  He did notice some hematuria and apparently is seeing a urologist as well.  He is very active, and denies chest pain or shortness of breath. Current Meds  Medication Sig    amLODipine-benazepril (LOTREL) 5-10 MG capsule 1 capsule.   amoxicillin (AMOXIL) 500 MG capsule Take four capsules one hour before dental appointment.   apixaban (ELIQUIS) 5 MG TABS tablet Take 5 mg by mouth 2 (two) times daily.   atorvastatin (LIPITOR) 10 MG tablet Take 10 mg by mouth daily.   Cholecalciferol (VITAMIN D3 SUPER STRENGTH) 50 MCG (2000 UT) TABS Take 2,000 Units by mouth.   cyanocobalamin 1000 MCG tablet Take 1,000 mcg by mouth daily.   DENTA 5000 PLUS 1.1 % CREA dental cream Place 1 application. onto teeth See admin instructions.   folic acid (FOLVITE) 400 MCG tablet Take 400 mcg by mouth daily.     No Known Allergies  Social History   Socioeconomic History   Marital status: Widowed    Spouse name: Not on file   Number of children: 1   Years of education: Not on file   Highest education level: Not on file  Occupational History   Not on file  Tobacco Use   Smoking status: Never   Smokeless tobacco: Never  Vaping Use   Vaping Use: Never used  Substance and Sexual Activity   Alcohol use: Yes   Drug use: No   Sexual activity: Yes  Other Topics Concern   Not on file  Social History Narrative   Not on file   Social Determinants of Health   Financial Resource Strain: Low Risk  (07/20/2020)   Overall Financial Resource Strain (CARDIA)    Difficulty of Paying  Living Expenses: Not hard at all  Food Insecurity: No Food Insecurity (07/20/2020)   Hunger Vital Sign    Worried About Running Out of Food in the Last Year: Never true    Ran Out of Food in the Last Year: Never true  Transportation Needs: No Transportation Needs (07/20/2020)   PRAPARE - Administrator, Civil Service (Medical): No    Lack of Transportation (Non-Medical): No  Physical Activity: Not on file  Stress: Not on file  Social Connections: Moderately Isolated (07/20/2020)   Social Connection and Isolation Panel [NHANES]    Frequency of Communication with Friends and Family: More than  three times a week    Frequency of Social Gatherings with Friends and Family: More than three times a week    Attends Religious Services: Never    Database administrator or Organizations: No    Attends Engineer, structural: Not on file    Marital Status: Married  Catering manager Violence: Not on file     Review of Systems: General: negative for chills, fever, night sweats or weight changes.  Cardiovascular: negative for chest pain, dyspnea on exertion, edema, orthopnea, palpitations, paroxysmal nocturnal dyspnea or shortness of breath Dermatological: negative for rash Respiratory: negative for cough or wheezing Urologic: negative for hematuria Abdominal: negative for nausea, vomiting, diarrhea, bright red blood per rectum, melena, or hematemesis Neurologic: negative for visual changes, syncope, or dizziness All other systems reviewed and are otherwise negative except as noted above.    Blood pressure 120/86, pulse (!) 127, height 6' (1.829 m), weight 158 lb 3.2 oz (71.8 kg), SpO2 98 %.  General appearance: alert and no distress Neck: no adenopathy, no carotid bruit, no JVD, supple, symmetrical, trachea midline, and thyroid not enlarged, symmetric, no tenderness/mass/nodules Lungs: clear to auscultation bilaterally Heart: irregularly irregular rhythm Extremities: extremities normal, atraumatic, no cyanosis or edema Pulses: 2+ and symmetric Skin: Skin color, texture, turgor normal. No rashes or lesions Neurologic: Grossly normal  EKG atrial flutter with 3 1 conduction and a ventricular sponsor 127.  There was left axis deviation.  I personally reviewed this EKG.  ASSESSMENT AND PLAN:   Paroxysmal atrial fibrillation (HCC) History of PAF dating back to 2016 when he had several episodes and converted spontaneously to sinus rhythm.  He was found to be in a flutter a week ago by his primary care provider.  He was placed on Eliquis oral anticoagulation.  Plan will be for  outpatient DC cardioversion in 3 weeks.  His thyroid function tests were normal.The CHA2DSVASC2 score is  2 .   Hyperlipidemia History of hyperlipidemia on atorvastatin 10 mg a day with lipid profile performed 02/25/2023 revealing total cholesterol 158, LDL 93 and HDL of 52.  I am going to get a coronary calcium score to further evaluate and risk stratify.  Thoracic aortic aneurysm (HCC) History of small thoracic aortic aneurysm assessed annually by CT angiogram.  This was performed most recently 03/14/2022 revealing dimensions to be 40 to millimeters.  This is followed on an annual basis.  Essential hypertension Recent diagnosis of essential hypertension blood pressure measured at 120/86.  He was recently begun on Lotrel with improvement in his blood pressure readings.     Patrick Gess MD FACP,FACC,FAHA, [facility] 03/12/2023 10:08 AM

## 2023-03-12 NOTE — Assessment & Plan Note (Signed)
History of small thoracic aortic aneurysm assessed annually by CT angiogram.  This was performed most recently 03/14/2022 revealing dimensions to be 40 to millimeters.  This is followed on an annual basis.

## 2023-03-12 NOTE — Assessment & Plan Note (Addendum)
History of PAF dating back to 2016 when he had several episodes and converted spontaneously to sinus rhythm.  He was found to be in a flutter a week ago by his primary care provider.  He was placed on Eliquis oral anticoagulation.  Plan will be for outpatient DC cardioversion in 3 weeks.  His thyroid function tests were normal.The CHA2DSVASC2 score is  2 .

## 2023-03-12 NOTE — Patient Instructions (Signed)
Medication Instructions:  Your physician recommends that you continue on your current medications as directed. Please refer to the Current Medication list given to you today.  *If you need a refill on your cardiac medications before your next appointment, please call your pharmacy*   Lab Work: Your physician recommends that you have labs drawn today: BMET & CBC  If you have labs (blood work) drawn today and your tests are completely normal, you will receive your results only by: MyChart Message (if you have MyChart) OR A paper copy in the mail If you have any lab test that is abnormal or we need to change your treatment, we will call you to review the results.   Testing/Procedures: Your physician has requested that you have an echocardiogram. Echocardiography is a painless test that uses sound waves to create images of your heart. It provides your doctor with information about the size and shape of your heart and how well your heart's chambers and valves are working. This procedure takes approximately one hour. There are no restrictions for this procedure. Please do NOT wear cologne, perfume, aftershave, or lotions (deodorant is allowed). Please arrive 15 minutes prior to your appointment time. This will take place at [address] [place]   Dr. [name] has ordered a CT coronary calcium score.   Test locations:  [facility]  [facility] at [facility]  This is $99 out of pocket.   Coronary CalciumScan A coronary calcium scan is an imaging test used to look for deposits of calcium and other fatty materials (plaques) in the inner lining of the blood vessels of the heart (coronary arteries). These deposits of calcium and plaques can partly clog and narrow the coronary arteries without producing any symptoms or warning signs. This puts a person at risk for a heart attack. This test can detect these deposits  before symptoms develop. Tell a health care provider about: Any allergies you have. All medicines you are taking, including vitamins, herbs, eye drops, creams, and over-the-counter medicines. Any problems you or family members have had with anesthetic medicines. Any blood disorders you have. Any surgeries you have had. Any medical conditions you have. Whether you are pregnant or may be pregnant. What are the risks? Generally, this is a safe procedure. However, problems may occur, including: Harm to a pregnant woman and her unborn baby. This test involves the use of radiation. Radiation exposure can be dangerous to a pregnant woman and her unborn baby. If you are pregnant, you generally should not have this procedure done. Slight increase in the risk of cancer. This is because of the radiation involved in the test. What happens before the procedure? No preparation is needed for this procedure. What happens during the procedure? You will undress and remove any jewelry around your neck or chest. You will put on a hospital gown. Sticky electrodes will be placed on your chest. The electrodes will be connected to an electrocardiogram (ECG) machine to record a tracing of the electrical activity of your heart. A CT scanner will take pictures of your heart. During this time, you will be asked to lie still and hold your breath for 2-3 seconds while a picture of your heart is being taken. The procedure may vary among health care providers and hospitals. What happens after the procedure? You can get dressed. You can return to your normal activities. It is up to you to get the results of  your test. Ask your health care provider, or the department that is doing the test, when your results will be ready. Summary A coronary calcium scan is an imaging test used to look for deposits of calcium and other fatty materials (plaques) in the inner lining of the blood vessels of the heart (coronary  arteries). Generally, this is a safe procedure. Tell your health care provider if you are pregnant or may be pregnant. No preparation is needed for this procedure. A CT scanner will take pictures of your heart. You can return to your normal activities after the scan is done. This information is not intended to replace advice given to you by your health care provider. Make sure you discuss any questions you have with your health care provider. Document Released: 03/14/2008 Document Revised: 08/05/2016 Document Reviewed: 08/05/2016 Elsevier Interactive Patient Education  2017 [employer].    Follow-Up: At [facility], you and your health needs are our priority.  As part of our continuing mission to provide you with exceptional heart care, we have created designated Provider Care Teams.  These Care Teams include your primary Cardiologist (physician) and Advanced Practice Providers (APPs -  Physician Assistants and Nurse Practitioners) who all work together to provide you with the care you need, when you need it.  We recommend signing up for the patient portal called "MyChart".  Sign up information is provided on this After Visit Summary.  MyChart is used to connect with patients for Virtual Visits (Telemedicine).  Patients are able to view lab/test results, encounter notes, upcoming appointments, etc.  Non-urgent messages can be sent to your provider as well.   To learn more about what you can do with MyChart, go to [email or web address].    Your next appointment:   6-8 week(s)  Provider: Nanetta Batty, MD   Other Instructions   You are scheduled for a Cardioversion on Wednesday, July 10 with Dr. Servando Salina.  Please arrive at the [facility] (Main Entrance A) at [facility]: [address] [place], [place] [address] at 7:30 AM (This time is 1 hour(s) before your procedure to ensure your preparation). Free valet parking service is available. You will check in at ADMITTING. The  support person will be asked to wait in the waiting room.  It is OK to have someone drop you off and come back when you are ready to be discharged.    DIET:  Nothing to eat or drink after midnight except a sip of water with medications (see medication instructions below)  MEDICATION INSTRUCTIONS: !!IF ANY NEW MEDICATIONS ARE STARTED AFTER TODAY, PLEASE NOTIFY YOUR PROVIDER AS SOON AS POSSIBLE!!  FYI: Medications such as Semaglutide (Ozempic, [place]), Tirzepatide (Mounjaro, Zepbound), Dulaglutide (Trulicity), etc ("GLP1 agonists") AND Canagliflozin (Invokana), Dapagliflozin (Farxiga), Empagliflozin (Jardiance), Ertugliflozin (Steglatro), Bexagliflozin [employer]) or any combination with one of these drugs such as Invokamet (Canagliflozin/Metformin), Synjardy (Empagliflozin/Metformin), etc ("SGLT2 inhibitors") must be held around the time of a procedure. This is not a comprehensive list of all of these drugs. Please review all of your medications and talk to your provider if you take any one of these. If you are not sure, ask your provider.     Continue taking your anticoagulant (blood thinner): Apixaban (Eliquis).  You will need to continue this after your procedure until you are told by your provider that it is safe to stop.    LABS: Labs drawn 03/12/23.   FYI:  For your safety, and to allow [place] to monitor your vital  signs accurately during the surgery/procedure we request: If you have artificial nails, gel coating, SNS etc, please have those removed prior to your surgery/procedure. Not having the nail coverings /polish removed may result in cancellation or delay of your surgery/procedure.  You must have a responsible person to drive you home and stay in the waiting area during your procedure. Failure to do so could result in cancellation.  Bring your insurance cards.  *Special Note: Every effort is made to have your procedure done on time. Occasionally there are emergencies that occur at the  hospital that may cause delays. Please be patient if a delay does occur.

## 2023-03-13 LAB — BASIC METABOLIC PANEL
BUN/Creatinine Ratio: 14 (ref 10–24)
BUN: 12 mg/dL (ref 8–27)
CO2: 25 mmol/L (ref 20–29)
Calcium: 9.9 mg/dL (ref 8.6–10.2)
Chloride: 98 mmol/L (ref 96–106)
Creatinine, Ser: 0.86 mg/dL (ref 0.76–1.27)
Glucose: 91 mg/dL (ref 70–99)
Potassium: 5.4 mmol/L — ABNORMAL HIGH (ref 3.5–5.2)
Sodium: 136 mmol/L (ref 134–144)
eGFR: 91 mL/min/{1.73_m2} (ref >59)

## 2023-03-13 LAB — CBC
Hematocrit: 48.6 % (ref 37.5–51.0)
Hemoglobin: 16.4 g/dL (ref 13.0–17.7)
MCH: 30.6 pg (ref 26.6–33.0)
MCHC: 33.7 g/dL (ref 31.5–35.7)
MCV: 91 fL (ref 79–97)
Platelets: 232 10*3/uL (ref 150–450)
RBC: 5.36 x10E6/uL (ref 4.14–5.80)
RDW: 13.3 % (ref 11.6–15.4)
WBC: 4.4 10*3/uL (ref 3.4–10.8)

## 2023-03-14 ENCOUNTER — Other Ambulatory Visit: Payer: Self-pay

## 2023-03-14 DIAGNOSIS — I4892 Unspecified atrial flutter: Secondary | ICD-10-CM

## 2023-03-14 DIAGNOSIS — I480 Paroxysmal atrial fibrillation: Secondary | ICD-10-CM

## 2023-03-25 DIAGNOSIS — R310 Gross hematuria: Secondary | ICD-10-CM | POA: Diagnosis not present

## 2023-03-25 DIAGNOSIS — N132 Hydronephrosis with renal and ureteral calculous obstruction: Secondary | ICD-10-CM | POA: Diagnosis not present

## 2023-03-25 DIAGNOSIS — K7689 Other specified diseases of liver: Secondary | ICD-10-CM | POA: Diagnosis not present

## 2023-03-26 ENCOUNTER — Ambulatory Visit
Admission: RE | Admit: 2023-03-26 | Discharge: 2023-03-26 | Disposition: A | Discharge status: Home / Self Care | Payer: Medicare Other | Source: Ambulatory Visit | Attending: Internal Medicine | Admitting: Internal Medicine

## 2023-03-26 DIAGNOSIS — R3129 Other microscopic hematuria: Secondary | ICD-10-CM | POA: Diagnosis not present

## 2023-03-26 DIAGNOSIS — I4892 Unspecified atrial flutter: Secondary | ICD-10-CM | POA: Diagnosis not present

## 2023-03-26 DIAGNOSIS — I7121 Aneurysm of the ascending aorta, without rupture: Secondary | ICD-10-CM | POA: Diagnosis not present

## 2023-03-26 DIAGNOSIS — I10 Essential (primary) hypertension: Secondary | ICD-10-CM | POA: Diagnosis not present

## 2023-03-26 DIAGNOSIS — I712 Thoracic aortic aneurysm, without rupture, unspecified: Secondary | ICD-10-CM

## 2023-03-26 MED ORDER — IOPAMIDOL (ISOVUE-370) INJECTION 76%
75.0000 mL | Freq: Once | INTRAVENOUS | Status: AC | PRN
Start: 2023-03-26 — End: 2023-03-26
  Administered 2023-03-26: 75 mL via INTRAVENOUS

## 2023-04-01 ENCOUNTER — Telehealth: Payer: Self-pay | Admitting: Cardiology

## 2023-04-01 NOTE — Telephone Encounter (Signed)
Pt would like to reschedule his cardioversion. Please advise.

## 2023-04-01 NOTE — Telephone Encounter (Signed)
Pt returning nurse's call. Please advise

## 2023-04-01 NOTE — Telephone Encounter (Signed)
Left message for pt to call back to re-schedule cardioversion.

## 2023-04-01 NOTE — Telephone Encounter (Signed)
Pt called to reschedule cardioversion because of scheduling conflict with urology procedure. Called scheduling and able to change pt's cardioversion to 7/12 from 7/10. Pt will still need to be at [facility] at 7:30am for start time of 8:30am. Called pt back. He is aware of new date. Pt will not have to redo labs since they will still be within 30 days of procedure. Pt verbalizes understanding.

## 2023-04-01 NOTE — Telephone Encounter (Signed)
Patient would like to reschedule cardioversion. He has urology procedure that day. Do you reschedule him or scheduling

## 2023-04-01 NOTE — Telephone Encounter (Signed)
Left voicemail message to call the clinic. 

## 2023-04-03 ENCOUNTER — Telehealth: Payer: Self-pay | Admitting: Urology

## 2023-04-03 NOTE — Telephone Encounter (Signed)
Pt c/o left flank pain with recent L UPJ stone diagnosed. No sign of infection. Called in percocet and tamsulosin and he will f/u in office.

## 2023-04-07 ENCOUNTER — Ambulatory Visit (HOSPITAL_COMMUNITY): Payer: Medicare Other | Attending: Cardiovascular Disease

## 2023-04-07 DIAGNOSIS — I480 Paroxysmal atrial fibrillation: Secondary | ICD-10-CM | POA: Diagnosis not present

## 2023-04-07 DIAGNOSIS — E782 Mixed hyperlipidemia: Secondary | ICD-10-CM | POA: Diagnosis not present

## 2023-04-07 DIAGNOSIS — I7121 Aneurysm of the ascending aorta, without rupture: Secondary | ICD-10-CM | POA: Insufficient documentation

## 2023-04-07 DIAGNOSIS — I10 Essential (primary) hypertension: Secondary | ICD-10-CM | POA: Insufficient documentation

## 2023-04-07 LAB — ECHOCARDIOGRAM COMPLETE
Area-P 1/2: 3.37 cm2
S' Lateral: 2.3 cm

## 2023-04-09 DIAGNOSIS — N201 Calculus of ureter: Secondary | ICD-10-CM | POA: Diagnosis not present

## 2023-04-10 NOTE — Pre-Procedure Instructions (Signed)
Spoke to patient on phone regarding cardioversion for tomorrow.  Instructed to arrive at 8:15 am, NPO after midnight.  Confirmed patient has a ride home and responsible person to stay with patient for 24 hours after the procedure  Confirmed no missed doses of Eliquis and instructed patient to takes morning dose with a sip of water.  Take BP meds in the AM with a sip of water.

## 2023-04-11 ENCOUNTER — Encounter (HOSPITAL_COMMUNITY): Payer: Self-pay | Admitting: Internal Medicine

## 2023-04-11 ENCOUNTER — Ambulatory Visit (HOSPITAL_COMMUNITY)
Admission: RE | Admit: 2023-04-11 | Discharge: 2023-04-11 | Disposition: A | Discharge status: Home / Self Care | Payer: Medicare Other | Attending: Internal Medicine | Admitting: Internal Medicine

## 2023-04-11 ENCOUNTER — Encounter (HOSPITAL_COMMUNITY)
Admission: RE | Disposition: A | Discharge status: Home / Self Care | Payer: Self-pay | Source: Home / Self Care | Attending: Internal Medicine

## 2023-04-11 ENCOUNTER — Other Ambulatory Visit: Payer: Self-pay

## 2023-04-11 ENCOUNTER — Ambulatory Visit (HOSPITAL_BASED_OUTPATIENT_CLINIC_OR_DEPARTMENT_OTHER): Payer: Medicare Other | Admitting: Anesthesiology

## 2023-04-11 ENCOUNTER — Ambulatory Visit (HOSPITAL_COMMUNITY): Payer: Medicare Other | Admitting: Anesthesiology

## 2023-04-11 DIAGNOSIS — Z9221 Personal history of antineoplastic chemotherapy: Secondary | ICD-10-CM | POA: Insufficient documentation

## 2023-04-11 DIAGNOSIS — I480 Paroxysmal atrial fibrillation: Secondary | ICD-10-CM | POA: Insufficient documentation

## 2023-04-11 DIAGNOSIS — E785 Hyperlipidemia, unspecified: Secondary | ICD-10-CM

## 2023-04-11 DIAGNOSIS — I10 Essential (primary) hypertension: Secondary | ICD-10-CM

## 2023-04-11 DIAGNOSIS — Z7901 Long term (current) use of anticoagulants: Secondary | ICD-10-CM | POA: Diagnosis not present

## 2023-04-11 DIAGNOSIS — I4892 Unspecified atrial flutter: Secondary | ICD-10-CM

## 2023-04-11 DIAGNOSIS — Z79899 Other long term (current) drug therapy: Secondary | ICD-10-CM | POA: Diagnosis not present

## 2023-04-11 DIAGNOSIS — I712 Thoracic aortic aneurysm, without rupture, unspecified: Secondary | ICD-10-CM | POA: Insufficient documentation

## 2023-04-11 DIAGNOSIS — Z923 Personal history of irradiation: Secondary | ICD-10-CM | POA: Diagnosis not present

## 2023-04-11 HISTORY — PX: CARDIOVERSION: SHX1299

## 2023-04-11 LAB — POCT I-STAT, CHEM 8
BUN: 16 mg/dL (ref 8–23)
Calcium, Ion: 1.09 mmol/L — ABNORMAL LOW (ref 1.15–1.40)
Chloride: 107 mmol/L (ref 98–111)
Creatinine, Ser: 0.7 mg/dL (ref 0.61–1.24)
Glucose, Bld: 82 mg/dL (ref 70–99)
HCT: 35 % — ABNORMAL LOW (ref 39.0–52.0)
Hemoglobin: 11.9 g/dL — ABNORMAL LOW (ref 13.0–17.0)
Potassium: 3.6 mmol/L (ref 3.5–5.1)
Sodium: 141 mmol/L (ref 135–145)
TCO2: 22 mmol/L (ref 22–32)

## 2023-04-11 SURGERY — CARDIOVERSION
Anesthesia: General

## 2023-04-11 MED ORDER — PROPOFOL 10 MG/ML IV BOLUS
INTRAVENOUS | Status: DC | PRN
  Administered 2023-04-11: 70 mg via INTRAVENOUS

## 2023-04-11 MED ORDER — SODIUM CHLORIDE 0.9 % IV SOLN: INTRAVENOUS | Status: DC

## 2023-04-11 SURGICAL SUPPLY — 1 items: ELECT DEFIB PAD ADLT CADENCE (PAD) ×1 IMPLANT

## 2023-04-11 NOTE — Transfer of Care (Signed)
Immediate Anesthesia Transfer of Care Note  Patient: Patrick Nelson  Procedure(s) Performed: CARDIOVERSION  Patient Location: PACU  Anesthesia Type:MAC  Level of Consciousness: drowsy  Airway & Oxygen Therapy: Patient Spontanous Breathing  Post-op Assessment: Report given to RN and Post -op Vital signs reviewed and stable  Post vital signs: stable  Last Vitals:  Vitals Value Taken Time  BP 82/65 04/11/23 0909  Temp    Pulse 69 04/11/23 0910  Resp 20 04/11/23 0910  SpO2 98 % 04/11/23 0910  Vitals shown include unfiled device data.  Last Pain:  Vitals:   04/11/23 0827  TempSrc: Temporal  PainSc: 0-No pain         Complications: No notable events documented.

## 2023-04-11 NOTE — Anesthesia Postprocedure Evaluation (Signed)
Anesthesia Post Note  Patient: Patrick Nelson  Procedure(s) Performed: CARDIOVERSION     Patient location during evaluation: PACU Anesthesia Type: General Level of consciousness: awake and alert Pain management: pain level controlled Vital Signs Assessment: post-procedure vital signs reviewed and stable Respiratory status: spontaneous breathing, nonlabored ventilation and respiratory function stable Cardiovascular status: stable and blood pressure returned to baseline Anesthetic complications: no   No notable events documented.  Last Vitals:  Vitals:   04/11/23 0920 04/11/23 0930  BP: (!) 85/64   Pulse: (!) 56 60  Resp: 18 18  Temp:    SpO2: 97% 98%    Last Pain:  Vitals:   04/11/23 0913  TempSrc: Temporal  PainSc: 0-No pain                 Beryle Lathe

## 2023-04-11 NOTE — Anesthesia Preprocedure Evaluation (Addendum)
Anesthesia Evaluation  Patient identified by MRN, date of birth, ID band Patient awake    Reviewed: Allergy & Precautions, NPO status , Patient's Chart, lab work & pertinent test results  History of Anesthesia Complications Negative for: history of anesthetic complications  Airway Mallampati: III  TM Distance: >3 FB Neck ROM: Full    Dental  (+) Dental Advisory Given   Pulmonary neg pulmonary ROS   Pulmonary exam normal        Cardiovascular hypertension, Pt. on medications + dysrhythmias Atrial Fibrillation  Rhythm:Irregular Rate:Normal   '24 TTE - EF 55 to 60%. There is mild left ventricular hypertrophy of the septal segment. Right atrial size was severely dilated. Trivial mitral valve regurgitation. There is moderate dilatation of the aortic root, measuring 45 mm.     Neuro/Psych negative neurological ROS  negative psych ROS   GI/Hepatic negative GI ROS, Neg liver ROS,,,  Endo/Other  negative endocrine ROS    Renal/GU negative Renal ROS     Musculoskeletal negative musculoskeletal ROS (+)    Abdominal   Peds  Hematology  (+) Blood dyscrasia, anemia   Anesthesia Other Findings SCC tongue base   Reproductive/Obstetrics                             Anesthesia Physical Anesthesia Plan  ASA: 3  Anesthesia Plan: General   Post-op Pain Management:    Induction: Intravenous  PONV Risk Score and Plan: 2 and Treatment may vary due to age or medical condition and Propofol infusion  Airway Management Planned: Natural Airway and Mask  Additional Equipment: None  Intra-op Plan:   Post-operative Plan:   Informed Consent: I have reviewed the patients History and Physical, chart, labs and discussed the procedure including the risks, benefits and alternatives for the proposed anesthesia with the patient or authorized representative who has indicated his/her understanding and acceptance.        Plan Discussed with: CRNA and Anesthesiologist  Anesthesia Plan Comments:         Anesthesia Quick Evaluation

## 2023-04-11 NOTE — Discharge Instructions (Signed)

## 2023-04-11 NOTE — Interval H&P Note (Signed)
History and Physical Interval Note:  04/11/2023 8:15 AM  Patrick Nelson  has presented today for surgery, with the diagnosis of AFLUTTER.  The various methods of treatment have been discussed with the patient and family. After consideration of risks, benefits and other options for treatment, the patient has consented to  Procedure(s): CARDIOVERSION (N/A) as a surgical intervention.  The patient's history has been reviewed, patient examined, no change in status, stable for surgery.  I have reviewed the patient's chart and labs.  Questions were answered to the patient's satisfaction.     [name]

## 2023-04-11 NOTE — CV Procedure (Signed)
Procedure: Electrical Cardioversion Indications:  Atrial Flutter  Procedure Details:  Consent: Risks of procedure as well as the alternatives and risks of each were explained to the (patient/caregiver).  Consent for procedure obtained.  Time Out: Verified patient identification, verified procedure, site/side was marked, verified correct patient position, special equipment/implants available, medications/allergies/relevent history reviewed, required imaging and test results available. PERFORMED.  Patient placed on cardiac monitor, pulse oximetry, supplemental oxygen as necessary.  Sedation given:  propofol Pacer pads placed anterior and posterior chest.  Cardioverted 1 time(s).  Cardioversion with synchronized biphasic 120J shock.  Evaluation: Findings: Post procedure EKG shows: NSR Complications: None Patient did tolerate procedure well.  Time Spent Directly with the Patient:  15 minutes   [name] 04/11/2023, 9:26 AM

## 2023-04-14 ENCOUNTER — Encounter (HOSPITAL_COMMUNITY): Payer: Self-pay | Admitting: Internal Medicine

## 2023-04-15 ENCOUNTER — Encounter: Payer: Self-pay | Admitting: Cardiovascular Disease

## 2023-04-17 ENCOUNTER — Telehealth: Payer: Self-pay | Admitting: Cardiovascular Disease

## 2023-04-17 DIAGNOSIS — I480 Paroxysmal atrial fibrillation: Secondary | ICD-10-CM

## 2023-04-17 DIAGNOSIS — I10 Essential (primary) hypertension: Secondary | ICD-10-CM

## 2023-04-17 NOTE — Telephone Encounter (Signed)
Patient states he hasn't felt good since his procedure. When he stands up he feels dizzy. His bp goes up and down. Please advise

## 2023-04-17 NOTE — Telephone Encounter (Signed)
Patient states lightheaded and dizzy when he stands up.  Last a few moments BP avg 100-107/ 70-74  HR 80-90. Also see labs with lower  Hgb/HCT.  He is currently on Eliquis 5 mg BID. Takes Lotrel 5-10 each morning. Advised slow position changes and will send for any recommendations  Advised on results of Hgb/HCT and recheck of CBC in 3 weeks.  He states understanding

## 2023-04-18 DIAGNOSIS — I480 Paroxysmal atrial fibrillation: Secondary | ICD-10-CM | POA: Diagnosis not present

## 2023-04-18 DIAGNOSIS — R3129 Other microscopic hematuria: Secondary | ICD-10-CM | POA: Diagnosis not present

## 2023-04-18 DIAGNOSIS — I10 Essential (primary) hypertension: Secondary | ICD-10-CM | POA: Diagnosis not present

## 2023-04-18 DIAGNOSIS — I4892 Unspecified atrial flutter: Secondary | ICD-10-CM | POA: Diagnosis not present

## 2023-04-23 NOTE — Telephone Encounter (Signed)
Spoke with patient.  Has appt for next week.  Advised to have lab done this week in order for Doctor to review at appt.  Also states that his PCP stopped his BP medication.  He will bring his BP log that shows readings prior to and after med changes , for review.

## 2023-04-24 DIAGNOSIS — I10 Essential (primary) hypertension: Secondary | ICD-10-CM | POA: Diagnosis not present

## 2023-04-24 DIAGNOSIS — I480 Paroxysmal atrial fibrillation: Secondary | ICD-10-CM | POA: Diagnosis not present

## 2023-04-28 ENCOUNTER — Telehealth: Payer: Self-pay | Admitting: Cardiovascular Disease

## 2023-04-28 ENCOUNTER — Ambulatory Visit (HOSPITAL_BASED_OUTPATIENT_CLINIC_OR_DEPARTMENT_OTHER)
Admission: RE | Admit: 2023-04-28 | Discharge: 2023-04-28 | Disposition: A | Discharge status: Home / Self Care | Payer: Medicare Other | Source: Ambulatory Visit | Attending: Cardiovascular Disease | Admitting: Cardiovascular Disease

## 2023-04-28 DIAGNOSIS — I10 Essential (primary) hypertension: Secondary | ICD-10-CM

## 2023-04-28 DIAGNOSIS — I480 Paroxysmal atrial fibrillation: Secondary | ICD-10-CM

## 2023-04-28 DIAGNOSIS — I7121 Aneurysm of the ascending aorta, without rupture: Secondary | ICD-10-CM

## 2023-04-28 DIAGNOSIS — E782 Mixed hyperlipidemia: Secondary | ICD-10-CM

## 2023-04-28 DIAGNOSIS — R931 Abnormal findings on diagnostic imaging of heart and coronary circulation: Secondary | ICD-10-CM

## 2023-04-28 NOTE — Telephone Encounter (Signed)
   Pre-operative Risk Assessment    Patient Name: Patrick Nelson  DOB: Jul 22, 1951 MRN: [id number]      Request for Surgical Clearance    Procedure:  Shockwave Lithotripsy   Date of Surgery:  Clearance TBD                                 Surgeon:  Dr. Alvester Morin  Surgeon's Group or Practice Name: [facility]    Phone number:  [phone]  Fax number:  [phone]    Type of Clearance Requested:   - Medical  - Pharmacy:  Hold Apixaban (Eliquis) 48 hr hold    Type of Anesthesia:  Local    Additional requests/questions:    Signed, April Henson   04/28/2023, 4:37 PM

## 2023-04-28 NOTE — Telephone Encounter (Signed)
   Name: Patrick Nelson  DOB: 1951/02/26  MRN: [id number]  Primary Cardiologist: None  Chart reviewed as part of pre-operative protocol coverage. The patient has an upcoming visit scheduled with Dr. [name] on 04/30/23 at which time clearance can be addressed in case there are any issues that would impact surgical recommendations.  This message will also be routed to pharmacy pool  for input on holding Eliquis as requested below so that this information is available to the clearing provider at time of patient's appointment.   I will route this message as FYI to requesting party and remove this message from the preop box as separate preop APP input not needed at this time.   Please call with any questions.  Napoleon Form, Leodis Rains, NP  04/28/2023, 4:51 PM

## 2023-04-29 DIAGNOSIS — R931 Abnormal findings on diagnostic imaging of heart and coronary circulation: Secondary | ICD-10-CM | POA: Insufficient documentation

## 2023-04-29 NOTE — Telephone Encounter (Signed)
Patient with diagnosis of afib on Eliquis for anticoagulation.    Procedure: Shockwave Lithotripsy  Date of procedure: TBD  CHA2DS2-VASc Score = 3  This indicates a 3.2% annual risk of stroke. The patient's score is based upon: CHF History: 0 HTN History: 1 Diabetes History: 0 Stroke History: 0 Vascular Disease History: 1 Age Score: 1 Gender Score: 0   Elevated CAC 339 (60th percentile) and aortic atherosclerosis noted yesterday, PMH updated.  CrCl 66mL/min Platelet count 227K  Underwent DCCV on 04/11/23. Cannot hold anticoag for 4 weeks after - earliest date he can start holding for a procedure would be on August 10 for procedure the following week. At that time, he can hold Eliquis for 2 days prior to procedure as long as no new clinical concerns are noted at MD appt tomorrow.  **This guidance is not considered finalized until pre-operative APP has relayed final recommendations.**

## 2023-04-30 ENCOUNTER — Telehealth: Payer: Self-pay

## 2023-04-30 ENCOUNTER — Ambulatory Visit: Payer: Medicare Other | Admitting: Cardiovascular Disease

## 2023-04-30 ENCOUNTER — Ambulatory Visit: Payer: Medicare Other | Attending: Cardiovascular Disease | Admitting: Cardiovascular Disease

## 2023-04-30 ENCOUNTER — Encounter: Payer: Self-pay | Admitting: Cardiovascular Disease

## 2023-04-30 VITALS — BP 120/82 | HR 75 | Ht 72.0 in | Wt 160.4 lb

## 2023-04-30 DIAGNOSIS — I10 Essential (primary) hypertension: Secondary | ICD-10-CM | POA: Diagnosis not present

## 2023-04-30 DIAGNOSIS — E782 Mixed hyperlipidemia: Secondary | ICD-10-CM | POA: Diagnosis not present

## 2023-04-30 DIAGNOSIS — I480 Paroxysmal atrial fibrillation: Secondary | ICD-10-CM | POA: Diagnosis not present

## 2023-04-30 DIAGNOSIS — I7121 Aneurysm of the ascending aorta, without rupture: Secondary | ICD-10-CM

## 2023-04-30 DIAGNOSIS — I4892 Unspecified atrial flutter: Secondary | ICD-10-CM | POA: Diagnosis not present

## 2023-04-30 DIAGNOSIS — R931 Abnormal findings on diagnostic imaging of heart and coronary circulation: Secondary | ICD-10-CM

## 2023-04-30 MED ORDER — ATORVASTATIN CALCIUM 20 MG PO TABS: 10.0000 mg | ORAL_TABLET | Freq: Every morning | ORAL | 3 refills | Status: DC

## 2023-04-30 NOTE — Patient Instructions (Signed)
Medication Instructions:  Your physician has recommended you make the following change in your medication:   -Increase atorvastatin (lipitor) to 20mg  once daily.  *If you need a refill on your cardiac medications before your next appointment, please call your pharmacy*   Lab Work: Your physician recommends that you return for lab work in: 3 months for FASTING lipid/liver panel  If you have labs (blood work) drawn today and your tests are completely normal, you will receive your results only by: MyChart Message (if you have MyChart) OR A paper copy in the mail If you have any lab test that is abnormal or we need to change your treatment, we will call you to review the results.   Testing/Procedures: Your physician has requested that you have an echocardiogram. Echocardiography is a painless test that uses sound waves to create images of your heart. It provides your doctor with information about the size and shape of your heart and how well your heart's chambers and valves are working. This procedure takes approximately one hour. There are no restrictions for this procedure. Please do NOT wear cologne, perfume, aftershave, or lotions (deodorant is allowed). Please arrive 15 minutes prior to your appointment time. This will take place at [address] [place] **To do in July 2025**    Follow-Up: At [facility], you and your health needs are our priority.  As part of our continuing mission to provide you with exceptional heart care, we have created designated Provider Care Teams.  These Care Teams include your primary Cardiologist (physician) and Advanced Practice Providers (APPs -  Physician Assistants and Nurse Practitioners) who all work together to provide you with the care you need, when you need it.  We recommend signing up for the patient portal called "MyChart".  Sign up information is provided on this After Visit Summary.  MyChart is used to connect with patients for  Virtual Visits (Telemedicine).  Patients are able to view lab/test results, encounter notes, upcoming appointments, etc.  Non-urgent messages can be sent to your provider as well.   To learn more about what you can do with MyChart, go to [email or web address].    Your next appointment:   6 month(s)  Provider:   Nanetta Batty, MD

## 2023-04-30 NOTE — Assessment & Plan Note (Signed)
History of elevated coronary calcium score 339.  He is not at goal for secondary prevention with regards to his lipids.  Will adjust his atorvastatin dose LDL goal less than 70.  He is otherwise active and asymptomatic.

## 2023-04-30 NOTE — Telephone Encounter (Signed)
Left message for pt to call back regarding schedule for today.

## 2023-04-30 NOTE — Assessment & Plan Note (Signed)
History of PAF status post successful outpatient DC cardioversion by Dr. Carolan Clines 04/11/2023.  He is on Eliquis oral anticoagulation.  He feels better in sinus rhythm.  He can hold his Eliquis for 3 days after 4 weeks from cardioversion for his lithotripsy.

## 2023-04-30 NOTE — Assessment & Plan Note (Signed)
History of hyperlipidemia on low-dose atorvastatin with lipid profile performed 02/17/2023 revealing total cholesterol 158, LDL 93 and HDL 52.

## 2023-04-30 NOTE — Progress Notes (Signed)
04/30/2023 Patrick Nelson   September 21, 1951  [id number]  Primary Physician Georgann Housekeeper, MD Primary Cardiologist: Runell Gess MD Patrick Nelson, [place]  HPI:  Patrick Nelson is a 72 y.o.  widowed Caucasian male father of one child, a grandfather with 3 grandchildren referred by Dr. Georgann Housekeeper  at [facility] medical for evaluation of one episode of paroxysmal atrial fibrillation. I last saw him in the office 03/12/2023.  He is accompanied by his girlfriend [name] today.  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  [name] has no cardiovascular risk factors other than hyperlipidemia on statin therapy. He has never had a heart attack or stroke. There is no family history. He denies chest pain or shortness of breath. He had a viral illness possible month ago and was seen in urgent care at which time he was noted to have an irregular heart rhythm and EKG apparently showed atrial fibrillation. This actually spontaneously converted to sinus rhythm and has not recurred until recently when he was hospitalized with pneumonia and had PAF as well which converted spontaneously. Chest x-ray showed a large left sternal border and a CT abdomen was suggested by radiology to rule out upper echo abdominal aneurysm. He is recovered from his pneumonia and feels clinically well. He is active and exercises on a daily basis.      He had throat cancer and underwent chemotherapy and radiation therapy 3 years ago and apparently has "never bounced back".  He saw his PCP a week ago at which time his blood pressure was elevated and he was found to be in a flutter.  He was placed on antihypertensive medications and be again on Eliquis oral anticoagulation.  He did notice some hematuria and apparently is seeing a urologist as well.  I arranged him to undergo outpatient cardioversion 04/11/2023 by Dr. Carolan Clines which was successful.  He remains in sinus rhythm on  Eliquis.  He can tell a difference in sinus rhythm.  He has more energy.  He did have a coronary calcium score performed 04/28/2023 which was 339.  His ascending thoracic aorta measured 43 mm by echo 45 mm.  He is scheduled to have lithotripsy because of nephrolithiasis in the near future.  Current Meds  Medication Sig   amoxicillin (AMOXIL) 500 MG capsule Take four capsules one hour before dental appointment.   apixaban (ELIQUIS) 5 MG TABS tablet Take 1 tablet (5 mg total) by mouth 2 (two) times daily.   atorvastatin (LIPITOR) 10 MG tablet Take 10 mg by mouth in the morning.   Cholecalciferol (VITAMIN D3 SUPER STRENGTH) 50 MCG (2000 UT) TABS Take 2,000 Units by mouth daily in the afternoon.   Chromium Picolinate (CHROMIUM PICOLATE PO) Take 1 tablet by mouth daily in the afternoon.   Coenzyme Q10 (COQ10) 100 MG CAPS Take 100 mg by mouth daily in the afternoon.   cyanocobalamin 1000 MCG tablet Take 1,000 mcg by mouth daily in the afternoon.   ELDERBERRY-VITAMIN C-ZINC PO Take 1 capsule by mouth daily in the afternoon.   Magnesium Oxide -Mg Supplement (MAG-200) 200 MG TABS Take 200 mg by mouth daily in the afternoon.   Multiple Vitamins-Minerals (IMMUNE SUPPORT PO) Take 1 capsule by mouth daily in the afternoon. Kyolic Aged Garlic Extract Immune Formula 103   SODIUM FLUORIDE 5000 PPM 1.1 % PSTE Place 1 Application onto teeth at bedtime.   tamsulosin (FLOMAX) 0.4 MG CAPS capsule Take 0.4  mg by mouth at bedtime.     No Known Allergies  Social History   Socioeconomic History   Marital status: Widowed    Spouse name: Not on file   Number of children: 1   Years of education: Not on file   Highest education level: Not on file  Occupational History   Not on file  Tobacco Use   Smoking status: Never   Smokeless tobacco: Never  Vaping Use   Vaping status: Never Used  Substance and Sexual Activity   Alcohol use: Yes   Drug use: No   Sexual activity: Yes  Other Topics Concern   Not on file   Social History Narrative   Not on file   Social Determinants of Health   Financial Resource Strain: Low Risk  (07/20/2020)   Overall Financial Resource Strain (CARDIA)    Difficulty of Paying Living Expenses: Not hard at all  Food Insecurity: Low Risk  (12/12/2022)   [facility]   Food vital sign    Within the past 12 months, you worried that your food would run out before you got money to buy more: Never true    Within the past 12 months, the food you bought just didn't last and you didn't have money to get more. : Never true  Transportation Needs: No Transportation Needs (12/12/2022)   [facility]   Transportation    In the past 12 months, has lack of reliable transportation kept you from medical appointments, meetings, work or from getting things needed for daily living? : No  Physical Activity: Not on file  Stress: Not on file  Social Connections: Moderately Isolated (07/20/2020)   Social Connection and Isolation Panel [NHANES]    Frequency of Communication with Friends and Family: More than three times a week    Frequency of Social Gatherings with Friends and Family: More than three times a week    Attends Religious Services: Never    Database administrator or Organizations: No    Attends Engineer, structural: Not on file    Marital Status: Married  Catering manager Violence: Not on file     Review of Systems: General: negative for chills, fever, night sweats or weight changes.  Cardiovascular: negative for chest pain, dyspnea on exertion, edema, orthopnea, palpitations, paroxysmal nocturnal dyspnea or shortness of breath Dermatological: negative for rash Respiratory: negative for cough or wheezing Urologic: negative for hematuria Abdominal: negative for nausea, vomiting, diarrhea, bright red blood per rectum, melena, or hematemesis Neurologic: negative for visual changes, syncope, or dizziness All other  systems reviewed and are otherwise negative except as noted above.    Blood pressure 120/82, pulse 75, height 6' (1.829 m), weight 160 lb 6.4 oz (72.8 kg), SpO2 97%.  General appearance: alert and no distress Neck: no adenopathy, no carotid bruit, no JVD, supple, symmetrical, trachea midline, and thyroid not enlarged, symmetric, no tenderness/mass/nodules Lungs: clear to auscultation bilaterally Heart: regular rate and rhythm, S1, S2 normal, no murmur, click, rub or gallop Extremities: extremities normal, atraumatic, no cyanosis or edema Pulses: 2+ and symmetric Skin: Skin color, texture, turgor normal. No rashes or lesions Neurologic: Grossly normal  EKG EKG Interpretation Date/Time:  Wednesday April 30 2023 14:15:06 EDT Ventricular Rate:  75 PR Interval:  280 QRS Duration:  70 QT Interval:  352 QTC Calculation: 393 R Axis:   -40  Text Interpretation: Sinus rhythm with marked sinus arrhythmia with 1st degree A-V  block with occasional Premature ventricular complexes Left axis deviation Low voltage QRS When compared with ECG of 11-Apr-2023 09:10, Premature ventricular complexes are now Present Premature supraventricular complexes are no longer Present Confirmed by Nanetta Batty [phone]) on 04/30/2023 2:22:16 PM    ASSESSMENT AND PLAN:   Paroxysmal atrial fibrillation (HCC) History of PAF status post successful outpatient DC cardioversion by Dr. Carolan Clines 04/11/2023.  He is on Eliquis oral anticoagulation.  He feels better in sinus rhythm.  He can hold his Eliquis for 3 days after 4 weeks from cardioversion for his lithotripsy.  Hyperlipidemia History of hyperlipidemia on low-dose atorvastatin with lipid profile performed 02/17/2023 revealing total cholesterol 158, LDL 93 and HDL 52.  Thoracic aortic aneurysm (HCC) History of thoracic aortic aneurysm measuring 43 mm.  Will recheck a 2D echo in 12 months.  Essential hypertension History of essential hypertension blood pressure  measured at 120/82.  His Lotrel was discontinued by his PCP because of symptomatic hypotension.  Elevated coronary artery calcium score History of elevated coronary calcium score 339.  He is not at goal for secondary prevention with regards to his lipids.  Will adjust his atorvastatin dose LDL goal less than 70.  He is otherwise active and asymptomatic.     Runell Gess MD FACP,FACC,FAHA, [facility] 04/30/2023 2:39 PM

## 2023-04-30 NOTE — Assessment & Plan Note (Signed)
History of essential hypertension blood pressure measured at 120/82.  His Lotrel was discontinued by his PCP because of symptomatic hypotension.

## 2023-04-30 NOTE — Telephone Encounter (Signed)
Spoke with pt regarding office visit. Able to move pt up in the day. Pt verbalizes understanding.

## 2023-04-30 NOTE — Assessment & Plan Note (Signed)
History of thoracic aortic aneurysm measuring 43 mm.  Will recheck a 2D echo in 12 months.

## 2023-05-02 ENCOUNTER — Other Ambulatory Visit: Payer: Self-pay | Admitting: Urology

## 2023-05-12 NOTE — Progress Notes (Signed)
Talked with patient. Instructions given. Hx and meds reviewed. Will take AM dose of Eliquis  Tuesday morning and hold PM dose. Arrival time 0800, NPO after MN. Driver secured. Bring blue folder in.

## 2023-05-12 NOTE — Progress Notes (Signed)
Left voicemail for patient to return call for instructions

## 2023-05-16 ENCOUNTER — Ambulatory Visit (HOSPITAL_BASED_OUTPATIENT_CLINIC_OR_DEPARTMENT_OTHER)
Admission: RE | Admit: 2023-05-16 | Discharge: 2023-05-16 | Disposition: A | Discharge status: Home / Self Care | Payer: Medicare Other | Attending: Urology | Admitting: Urology

## 2023-05-16 ENCOUNTER — Encounter (HOSPITAL_BASED_OUTPATIENT_CLINIC_OR_DEPARTMENT_OTHER)
Admission: RE | Disposition: A | Discharge status: Home / Self Care | Payer: Self-pay | Source: Home / Self Care | Attending: Urology

## 2023-05-16 ENCOUNTER — Other Ambulatory Visit: Payer: Self-pay

## 2023-05-16 ENCOUNTER — Encounter (HOSPITAL_BASED_OUTPATIENT_CLINIC_OR_DEPARTMENT_OTHER): Payer: Self-pay | Admitting: Urology

## 2023-05-16 ENCOUNTER — Other Ambulatory Visit (HOSPITAL_COMMUNITY): Payer: Self-pay

## 2023-05-16 ENCOUNTER — Ambulatory Visit (HOSPITAL_COMMUNITY): Payer: Medicare Other

## 2023-05-16 DIAGNOSIS — Z85810 Personal history of malignant neoplasm of tongue: Secondary | ICD-10-CM | POA: Insufficient documentation

## 2023-05-16 DIAGNOSIS — Z85819 Personal history of malignant neoplasm of unspecified site of lip, oral cavity, and pharynx: Secondary | ICD-10-CM | POA: Diagnosis not present

## 2023-05-16 DIAGNOSIS — Z87442 Personal history of urinary calculi: Secondary | ICD-10-CM | POA: Diagnosis not present

## 2023-05-16 DIAGNOSIS — Z9221 Personal history of antineoplastic chemotherapy: Secondary | ICD-10-CM | POA: Diagnosis not present

## 2023-05-16 DIAGNOSIS — Z7901 Long term (current) use of anticoagulants: Secondary | ICD-10-CM | POA: Insufficient documentation

## 2023-05-16 DIAGNOSIS — N201 Calculus of ureter: Secondary | ICD-10-CM | POA: Diagnosis not present

## 2023-05-16 DIAGNOSIS — I4891 Unspecified atrial fibrillation: Secondary | ICD-10-CM | POA: Insufficient documentation

## 2023-05-16 DIAGNOSIS — Z923 Personal history of irradiation: Secondary | ICD-10-CM | POA: Diagnosis not present

## 2023-05-16 DIAGNOSIS — R310 Gross hematuria: Secondary | ICD-10-CM | POA: Insufficient documentation

## 2023-05-16 DIAGNOSIS — Z96643 Presence of artificial hip joint, bilateral: Secondary | ICD-10-CM | POA: Diagnosis not present

## 2023-05-16 HISTORY — PX: EXTRACORPOREAL SHOCK WAVE LITHOTRIPSY: SHX1557

## 2023-05-16 SURGERY — LITHOTRIPSY, ESWL
Anesthesia: LOCAL | Laterality: Left

## 2023-05-16 MED ORDER — CIPROFLOXACIN HCL 500 MG PO TABS
500.0000 mg | ORAL_TABLET | ORAL | Status: AC
  Administered 2023-05-16: 500 mg via ORAL

## 2023-05-16 MED ORDER — DIAZEPAM 5 MG PO TABS
ORAL_TABLET | ORAL | Status: AC
  Filled 2023-05-16: qty 2

## 2023-05-16 MED ORDER — OXYCODONE HCL 5 MG PO TABS
5.0000 mg | ORAL_TABLET | Freq: Three times a day (TID) | ORAL | 0 refills | Status: DC | PRN
Start: 2023-05-16 — End: 2023-05-29
  Filled 2023-05-16: qty 20, 7d supply, fill #0

## 2023-05-16 MED ORDER — DIAZEPAM 5 MG PO TABS
10.0000 mg | ORAL_TABLET | ORAL | Status: AC
  Administered 2023-05-16: 10 mg via ORAL

## 2023-05-16 MED ORDER — CIPROFLOXACIN HCL 500 MG PO TABS
ORAL_TABLET | ORAL | Status: AC
  Filled 2023-05-16: qty 1

## 2023-05-16 MED ORDER — TAMSULOSIN HCL 0.4 MG PO CAPS
0.4000 mg | ORAL_CAPSULE | Freq: Every day | ORAL | 0 refills | Status: AC
  Filled 2023-05-16: qty 30, 30d supply, fill #0

## 2023-05-16 MED ORDER — DIPHENHYDRAMINE HCL 25 MG PO CAPS
ORAL_CAPSULE | ORAL | Status: AC
  Filled 2023-05-16: qty 1

## 2023-05-16 MED ORDER — SODIUM CHLORIDE 0.9 % IV SOLN: INTRAVENOUS | Status: DC

## 2023-05-16 MED ORDER — DIPHENHYDRAMINE HCL 25 MG PO CAPS
25.0000 mg | ORAL_CAPSULE | ORAL | Status: AC
  Administered 2023-05-16: 25 mg via ORAL

## 2023-05-16 NOTE — H&P (Signed)
CC/HPI: CC: Prostate checkup  History of present illness: Former patient of Dr. Brunilda Payor. He has a history of kidney stones and was last seen in 2014. He did have some gross hematuria that time and a cystoscopy was recommended but he did not follow-up. He has had no further gross hematuria since then. Urinalysis is negative today. Last PSA was 05/05/2017 and was normal at 0.61. He has minimal voiding complaints. He has nocturia 1-2 times a night but otherwise he voids well. Denies any erectile dysfunction.   04/29/2019  Patient is doing well today. Minimal voiding complaints. He has a physical coming up and plans to get his PSA checked then. No hematuria or dysuria.   03/06/2021  Patient states that his PSA about 3 weeks ago was around 1. The value I have for review is from 06/15/2019 and it was 0.99. In the interval, he was diagnosed with throat cancer and is status post chemotherapy and radiation. He states that he has been cured.   04/25/2022: Here today for f/u exam. I do not have records of any recent PSA assessment. 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 Today he tells me he had PSA checked with primary care about a month ago but is unclear about the result. He told me he was told that his labs were normal. He thought that primary care was going to send [place] a copy of the result but that has yet to occur. Overall doing well. He does have some mild to moderate lower urinary tract symptoms with IPSS remains stable. He is not overly bothered by the symptoms and does not want to pursue additional treatment or evaluation for such. He denies any interval dysuria, gross hematuria or interval treatment for UTI.   02/17/2023  Patient has a history of gross hematuria in the past as well as nephrolithiasis. Comes in today with complaints of gross hematuria. He increase his hydration and the gross hematuria has resolved. Has microscopic hematuria on  urinalysis today. Blood was painless in nature. Denies any flank pain. Denies any dysuria.   04/09/2023  Patient underwent a CT hematuria protocol that revealed a 5 mm obstructing left proximal ureteral calculus. He was having some pain over 4 July and some pain medication was called in. His pain has resolved and he has not had much issue with the pain. No further gross hematuria. He has atrial fibrillation and is scheduled for an ablation on Friday.     ALLERGIES:  No Allergies    MEDICATIONS: Percocet 5 mg-325 mg tablet 1 tablet PO Q 4 H PRN  Percocet 5 mg-325 mg tablet 1 tablet PO Q 4 H PRN  Tamsulosin Hcl 0.4 mg capsule 1 capsule PO Q HS  Atorvastatin Calcium 10 mg tablet Oral  Gabapentin     GU PSH: Locm 300-399Mg /Ml Iodine,1Ml - 03/25/2023       PSH Notes: Total Hip Replacement   NON-GU PSH: Hip Replacement, Right     GU PMH: Gross hematuria - 03/25/2023, (Stable), - 02/17/2023, Gross hematuria, - 2014 BPH w/o LUTS - 02/17/2023, - 04/25/2022, - 2022, Benign prostatic hypertrophy without lower urinary tract symptoms, - 2014 Encounter for Prostate Cancer screening - 02/17/2023, - 04/25/2022, - 2022 Epididymal cyst - 2019 BPH w/LUTS, Benign localized prostatic hyperplasia with lower urinary tract symptoms (LUTS) - 2014 Ureteral calculus, Ureteral Stone - 2014 Urinary Tract Inf, Unspec site, Urinary tract infection - 2014  PMH Notes:  1898-09-30 00:00:00 - Note: Normal Routine History And Physical Adult  Throat Cancer   NON-GU PMH: Hypercholesterolemia    FAMILY HISTORY: 1 son - Other Family Health Status - Mother's Age - Runs In Family Father Deceased At [employer] ___ - Runs In Family   SOCIAL HISTORY: Marital Status: Married Preferred Language: English; Race: White Current Smoking Status: Patient has never smoked.   Tobacco Use Assessment Completed: Used Tobacco in last 30 days? Drinks 1 caffeinated drink per day.     Notes: Never A Smoker, Marital History - Widowed,  Alcohol Use, Caffeine Use, Tobacco Use   REVIEW OF SYSTEMS:    GU Review Male:   Patient denies frequent urination, hard to postpone urination, burning/ pain with urination, get up at night to urinate, leakage of urine, stream starts and stops, trouble starting your stream, have to strain to urinate , erection problems, and penile pain.  Gastrointestinal (Upper):   Patient denies nausea, vomiting, and indigestion/ heartburn.  Gastrointestinal (Lower):   Patient denies diarrhea and constipation.  Constitutional:   Patient denies fever, night sweats, weight loss, and fatigue.  Skin:   Patient denies skin rash/ lesion and itching.  Eyes:   Patient denies blurred vision and double vision.  Ears/ Nose/ Throat:   Patient denies sore throat and sinus problems.  Hematologic/Lymphatic:   Patient denies swollen glands and easy bruising.  Cardiovascular:   Patient denies leg swelling and chest pains.  Respiratory:   Patient denies cough and shortness of breath.  Endocrine:   Patient denies excessive thirst.  Musculoskeletal:   Patient denies back pain and joint pain.  Neurological:   Patient denies dizziness and headaches.  Psychologic:   Patient denies depression and anxiety.   VITAL SIGNS: None   MULTI-SYSTEM PHYSICAL EXAMINATION:    Constitutional: Well-nourished. No physical deformities. Normally developed. Good grooming.  Gastrointestinal: No mass, no tenderness, no rigidity, non obese abdomen.  Eyes: Normal conjunctivae. Normal eyelids.  Musculoskeletal: Normal gait and station of head and neck.     Complexity of Data:  Source Of History:  Patient  Records Review:   Previous Doctor Records, Previous Patient Records  Urine Test Review:   Urinalysis  X-Ray Review: C.T. Abdomen/Pelvis: Reviewed Films. Reviewed Report. Discussed With Patient.     02/17/23 05/05/12 05/09/11 11/12/10 06/01/08 04/29/07 04/04/06  PSA  Total PSA 1.04 ng/mL 1.05  0.79  1.04  0.93  0.86  1.64     PROCEDURES:          KUB - 74018  A single view of the abdomen is obtained.  5 mm stone in the left proximal ureter redemonstrated on KUB      . Patient confirmed No Neulasta OnPro Device.           Urinalysis w/Scope Dipstick Dipstick Cont'd Micro  Color: Yellow Bilirubin: Neg mg/dL WBC/hpf: 6 - 16/XWR  Appearance: Slightly Cloudy Ketones: Neg mg/dL RBC/hpf: NS (Not Seen)  Specific Gravity: 1.020 Blood: Neg ery/uL Bacteria: NS (Not Seen)  pH: 6.5 Protein: Trace mg/dL Cystals: NS (Not Seen)  Glucose: Neg mg/dL Urobilinogen: 0.2 mg/dL Casts: NS (Not Seen)    Nitrites: Neg Trichomonas: Not Present    Leukocyte Esterase: Trace leu/uL Mucous: Present      Epithelial Cells: 6 - 10/hpf      Yeast: NS (Not Seen)      Sperm: Not Present    ASSESSMENT:      ICD-10 Details  1 GU:   Ureteral calculus -  N20.1 Undiagnosed New Problem  2   Gross hematuria - R31.0 Chronic, Stable   PLAN:           Document Letter(s):  Created for Patient: Clinical Summary         Notes:   We discussed the management of urinary stones. These options include observation, ureteroscopy, and shockwave lithotripsy. We discussed which options are relevant to these particular stones. We discussed the natural history of stones as well as the complications of untreated stones and the impact on quality of life without treatment as well as with each of the above listed treatments. We also discussed the efficacy of each treatment in its ability to clear the stone burden. With any of these management options I discussed the signs and symptoms of infection and the need for emergent treatment should these be experienced. For each option we discussed the ability of each procedure to clear the patient of their stone burden.   For observation I described the risks which include but are not limited to silent renal damage, life-threatening infection, need for emergent surgery, failure to pass stone, and pain.   For ureteroscopy I described the  risks which include heart attack, stroke, pulmonary embolus, death, bleeding, infection, damage to contiguous structures, positioning injury, ureteral stricture, ureteral avulsion, ureteral injury, need for ureteral stent, inability to perform ureteroscopy, need for an interval procedure, inability to clear stone burden, stent discomfort and pain.   For shockwave lithotripsy I described the risks which include arrhythmia, kidney contusion, kidney hemorrhage, need for transfusion, pain, inability to break up stone, inability to pass stone fragments, Steinstrasse, infection associated with obstructing stones, need for different surgical procedure, need for repeat shockwave lithotripsy.    Fortunately he is not symptomatic currently. He has a cardiac ablation on Friday. Prefer for her to be off blood thinner if possible. I will notify his cardiologist of the findings and discussed the plan. I discussed ESWL versus ureteroscopy. Would definitely have to be off blood thinner for ESWL but if needed may stay on it for ureteroscopy.

## 2023-05-16 NOTE — Op Note (Signed)
See [facility] OP note scanned into chart. Also because of the size, density, location and other factors that cannot be anticipated I feel this will likely be a staged procedure. This fact supersedes any indication in the scanned [facility] operative note to the contrary.  

## 2023-05-16 NOTE — Discharge Instructions (Addendum)
See [facility] discharge instructions in chart.   **You can resume eliquis on Monday, August 19**

## 2023-05-19 ENCOUNTER — Encounter (HOSPITAL_BASED_OUTPATIENT_CLINIC_OR_DEPARTMENT_OTHER): Payer: Self-pay | Admitting: Urology

## 2023-05-28 ENCOUNTER — Other Ambulatory Visit: Payer: Self-pay

## 2023-05-29 ENCOUNTER — Other Ambulatory Visit: Payer: Self-pay | Admitting: Nurse Practitioner

## 2023-05-29 ENCOUNTER — Inpatient Hospital Stay: Payer: Medicare Other | Attending: Nurse Practitioner | Admitting: Nurse Practitioner

## 2023-05-29 ENCOUNTER — Other Ambulatory Visit: Payer: Self-pay | Admitting: Hematology and Oncology

## 2023-05-29 ENCOUNTER — Encounter: Payer: Self-pay | Admitting: Nurse Practitioner

## 2023-05-29 ENCOUNTER — Inpatient Hospital Stay: Payer: Medicare Other

## 2023-05-29 VITALS — BP 129/88 | HR 64 | Temp 98.2°F | Resp 16 | Wt 160.4 lb

## 2023-05-29 DIAGNOSIS — C01 Malignant neoplasm of base of tongue: Secondary | ICD-10-CM

## 2023-05-29 DIAGNOSIS — Z79899 Other long term (current) drug therapy: Secondary | ICD-10-CM | POA: Insufficient documentation

## 2023-05-29 DIAGNOSIS — Z79620 Long term (current) use of immunosuppressive biologic: Secondary | ICD-10-CM | POA: Insufficient documentation

## 2023-05-29 LAB — TSH: TSH: 2.329 u[IU]/mL (ref 0.350–4.500)

## 2023-05-29 LAB — CBC WITH DIFFERENTIAL (CANCER CENTER ONLY)
Abs Immature Granulocytes: 0.02 10*3/uL (ref 0.00–0.07)
Basophils Absolute: 0 10*3/uL (ref 0.0–0.1)
Basophils Relative: 1 %
Eosinophils Absolute: 0.1 10*3/uL (ref 0.0–0.5)
Eosinophils Relative: 2 %
HCT: 43.8 % (ref 39.0–52.0)
Hemoglobin: 14.8 g/dL (ref 13.0–17.0)
Immature Granulocytes: 0 %
Lymphocytes Relative: 24 %
Lymphs Abs: 1.1 10*3/uL (ref 0.7–4.0)
MCH: 31.3 pg (ref 26.0–34.0)
MCHC: 33.8 g/dL (ref 30.0–36.0)
MCV: 92.6 fL (ref 80.0–100.0)
Monocytes Absolute: 0.5 10*3/uL (ref 0.1–1.0)
Monocytes Relative: 10 %
Neutro Abs: 3 10*3/uL (ref 1.7–7.7)
Neutrophils Relative %: 63 %
Platelet Count: 192 10*3/uL (ref 150–400)
RBC: 4.73 MIL/uL (ref 4.22–5.81)
RDW: 13.7 % (ref 11.5–15.5)
WBC Count: 4.7 10*3/uL (ref 4.0–10.5)
nRBC: 0 % (ref 0.0–0.2)

## 2023-05-29 LAB — CMP (CANCER CENTER ONLY)
ALT: 24 U/L (ref 0–44)
AST: 27 U/L (ref 15–41)
Albumin: 4.1 g/dL (ref 3.5–5.0)
Alkaline Phosphatase: 88 U/L (ref 38–126)
Anion gap: 4 — ABNORMAL LOW (ref 5–15)
BUN: 16 mg/dL (ref 8–23)
CO2: 30 mmol/L (ref 22–32)
Calcium: 9.5 mg/dL (ref 8.9–10.3)
Chloride: 106 mmol/L (ref 98–111)
Creatinine: 1.07 mg/dL (ref 0.61–1.24)
GFR, Estimated: >60 mL/min (ref >60)
Glucose, Bld: 86 mg/dL (ref 70–99)
Potassium: 4.1 mmol/L (ref 3.5–5.1)
Sodium: 140 mmol/L (ref 135–145)
Total Bilirubin: 0.7 mg/dL (ref 0.3–1.2)
Total Protein: 6.3 g/dL — ABNORMAL LOW (ref 6.5–8.1)

## 2023-05-29 NOTE — Progress Notes (Signed)
CLINIC:  Survivorship  Patient Care Team: Georgann Housekeeper, MD as PCP - General (Internal Medicine) Malmfelt, Lise Auer, RN as Oncology Nurse Navigator Lonie Peak, MD as Consulting Physician (Radiation Oncology) Jaci Standard, MD as Consulting Physician (Hematology and Oncology) Barron Alvine, CCC-SLP as Speech Language Pathologist (Speech Pathology) Jeanella Craze, Remi Deter, PT as Physical Therapist (Physical Therapy) Sallee Lange, LCSW (Inactive) as Social Worker (General Practice) Anabel Bene, RD as Dietitian (Nutrition) Serena Colonel, MD as Consulting Physician (Otolaryngology) Pollyann Samples, NP as Nurse Practitioner (Nurse Practitioner)   REASON FOR VISIT:  Routine follow-up for history of head & neck cancer.  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 Hypermetabolic right tongue base mass consistent with known neoplasm. Right level 2 and level 3 metastatic adenopathy. 2. No findings for distant metastatic disease involving the chest, abdomen or pelvis. 3. Moderate FDG uptake in the right and left atrial walls most typically seen with atrial fibrillation. Recommend clinical correlation.   06/21/2020 Cancer Staging   Staging form: Pharynx - HPV-Mediated Oropharynx, AJCC 8th Edition - Clinical stage from 06/21/2020: Stage I (cT1, cN1, cM0, p16+) - Signed by Lonie Peak, MD on 09/20/2020   07/03/2020 - 08/20/2021 Chemotherapy   Concurrent chemoradiation with weekly cisplatin per Dr. Leonides Schanz and Dr. Basilio Cairo Total radiation dose 70 Gray in 35 fractions Completed 7 weekly cycles of cisplatin    11/25/2020 PET scan   IMPRESSION: 1. No residual evidence of head neck carcinoma  in the oropharynx. 2. No evidence of metastatic adenopathy in the neck. 3. No evidence of distant metastatic disease. 4. Mild esophagitis.      INTERVAL HISTORY: Patrick Nelson returns for annual survivorship visit, last seen by me 05/30/2022.  Followed up with ENT and Dr. Leonides Schanz both in 11/2022.  He was recently hospitalized earlier this month for cardioversion then had a kidney stone. He has recovered, feeling well. Continues trying to maintain/gain weight. Feels an awareness or sensation sometimes when he turns head side to side, but not pain. Denies new mass or any other new or specific complaints.    -Pain: Denies -Nutrition/Diet: Normal -Dysphagia?: Denies -Dental issues?: Denies using fluoride trays? Yes -Last TSH: 12/12/22, normal; today's level is pending -Weight: stable  -Last ENT visit: Dr. Pollyann Kennedy 12/12/2022 -Last Rad Onc visit: Dr. Basilio Cairo 12/04/2021, now as needed -Last Dentist visit: q3 months     ADDITIONAL REVIEW OF SYSTEMS:  ROS    CURRENT MEDICATIONS:  Current Outpatient Medications on File Prior to Visit  Medication Sig Dispense Refill   ELIQUIS 5 MG TABS tablet Take 5 mg by mouth 2 (two) times daily.     amoxicillin (AMOXIL) 500 MG capsule Take four capsules one hour before dental appointment. 4 capsule 3   atorvastatin (LIPITOR) 20 MG tablet Take 0.5 tablets (10 mg total) by mouth in the morning. 90 tablet 3   Cholecalciferol (VITAMIN D3 SUPER STRENGTH) 50 MCG (2000 UT) TABS Take 2,000 Units by mouth daily in the afternoon.     Chromium Picolinate (CHROMIUM PICOLATE PO) Take 1 tablet by mouth daily in the afternoon.     Coenzyme Q10 (COQ10) 100 MG CAPS Take 100 mg by mouth daily in the afternoon.  cyanocobalamin 1000 MCG tablet Take 1,000 mcg by mouth daily in the afternoon.     ELDERBERRY-VITAMIN C-ZINC PO Take 1 capsule by mouth daily in the afternoon.     Magnesium Oxide -Mg Supplement (MAG-200) 200 MG TABS Take 200 mg by mouth daily in the afternoon.      Multiple Vitamins-Minerals (IMMUNE SUPPORT PO) Take 1 capsule by mouth daily in the afternoon. Kyolic Aged Garlic Extract Immune Formula 103     SODIUM FLUORIDE 5000 PPM 1.1 % PSTE Place 1 Application onto teeth at bedtime.     tamsulosin (FLOMAX) 0.4 MG CAPS capsule Take 1 capsule (0.4 mg total) by mouth at bedtime. 30 capsule 0   No current facility-administered medications on file prior to visit.    ALLERGIES:  No Known Allergies   PHYSICAL EXAM:  Vitals:   05/29/23 1217  BP: 129/88  Pulse: 64  Resp: 16  Temp: 98.2 F (36.8 C)  SpO2: 99%   Filed Weights   05/29/23 1217  Weight: 160 lb 6.4 oz (72.8 kg)     General: Well-nourished, well-appearing male in no acute distress.  Accompanied/Unaccompanied today.  HEENT:  Sclerae anicteric. Tongue pink, moist, and midline. Oropharynx is pink and moist, without lesions. Lymph: No preauricular, postauricular, cervical, supraclavicular, or infraclavicular lymphadenopathy noted on palpation.   Neck: No palpable masses.  Cardiovascular: Normal rate and rhythm. Respiratory: Clear; breathing non-labored.  Neuro: No focal deficits. Steady gait.   Psych: Normal mood and affect for situation. Extremities: No edema.  Skin: Warm and dry.    LABORATORY DATA:     Latest Ref Rng & Units 05/29/2023   12:10 PM 04/24/2023   11:25 AM 04/11/2023    8:33 AM  CBC  WBC 4.0 - 10.5 K/uL 4.7  4.6    Hemoglobin 13.0 - 17.0 g/dL 09.8  11.9  14.7   Hematocrit 39.0 - 52.0 % 43.8  42.4  35.0   Platelets 150 - 400 K/uL 192  227         Latest Ref Rng & Units 05/29/2023   12:10 PM 04/11/2023    8:33 AM 03/12/2023   10:38 AM  CMP  Glucose 70 - 99 mg/dL 86  82  91   BUN 8 - 23 mg/dL 16  16  12    Creatinine 0.61 - 1.24 mg/dL 8.29  5.62  1.30   Sodium 135 - 145 mmol/L 140  141  136   Potassium 3.5 - 5.1 mmol/L 4.1  3.6  5.4   Chloride 98 - 111 mmol/L 106  107  98   CO2 22 - 32 mmol/L 30   25   Calcium 8.9 - 10.3 mg/dL 9.5   9.9   Total Protein 6.5 -  8.1 g/dL 6.3     Total Bilirubin 0.3 - 1.2 mg/dL 0.7     Alkaline Phos 38 - 126 U/L 88     AST 15 - 41 U/L 27     ALT 0 - 44 U/L 24        DIAGNOSTIC IMAGING:  None at this visit.    ASSESSMENT & PLAN:  Patrick Nelson is a pleasant 72 y.o. male with history of clinical stage I tongue cancer, diagnosed in 05/2020;  treated with definitive concurrent chemoradiation with cisplatin; completed treatment on 08/18/2020.  Patient presents to survivorship clinic today for routine follow-up.   1.  Base of tongue cancer:  Patrick Nelson is clinically without evidence of disease or recurrence on physical exam  today.     2. Nutritional status: Patrick Nelson reports that he is currently able to consume adequate nutrition by mouth. Weight is stable.  Encouraged to continue to consume adequate hydration and nutrition, as tolerated.    3. At risk for dysphagia: No issues. Given Patrick Nelson's treatment for tongue cancer,  he is at risk for chronic dysphagia.  Continue SLP exercises at home.   4.  At risk for neck lymphedema:  no evidence of LE   5.  At risk for hypothyroidism: TSH has been normal, today's level is pending.  We discussed that he will continue to have serial TSH monitoring for at least the next 5 years as part of his routine follow-up and post-cancer treatment care.   6. At risk for tooth decay/dental concerns: Continue dentist 3-4 times per year. The patient should also continue to use his fluoride trays daily, as directed by dentistry.   7.  Lung cancer screening:  Patrick Nelson currently does not meet criteria for lung cancer screening.  Therefore, I have/have not placed a referral for screening today.    8. Tobacco & alcohol use: Patrick Nelson is smoke free and drinks alcohol rarely.   9. Health maintenance and wellness promotion: Encouraged healthy active life style and age appropriate care. He is compliant and up to date.  10. Support services/counseling: Patrick Nelson appears well adjusted. He declined  supportive care needs or referrals today. The patient was offered support today through active listening and expressive supportive counseling.     Dispo:  -Pt will call to make appt to see ENT this Fall -F/up with Dr. Leonides Schanz 11/2023 -Return to cancer center to see Dr. Basilio Cairo as needed -Return to cancer center to see Survivorship NP in 1 year     A total of 30 minutes was spent in the face-to-face care of this patient, with greater than 50% of that time spent in counseling and care-coordination.    Santiago Glad, NP Survivorship Program [facility] [phone]

## 2023-05-30 LAB — T4: T4, Total: 5.6 ug/dL (ref 4.5–12.0)

## 2023-06-10 DIAGNOSIS — L570 Actinic keratosis: Secondary | ICD-10-CM | POA: Diagnosis not present

## 2023-06-10 DIAGNOSIS — D225 Melanocytic nevi of trunk: Secondary | ICD-10-CM | POA: Diagnosis not present

## 2023-06-10 DIAGNOSIS — L821 Other seborrheic keratosis: Secondary | ICD-10-CM | POA: Diagnosis not present

## 2023-06-10 DIAGNOSIS — L578 Other skin changes due to chronic exposure to nonionizing radiation: Secondary | ICD-10-CM | POA: Diagnosis not present

## 2023-06-10 DIAGNOSIS — Z85828 Personal history of other malignant neoplasm of skin: Secondary | ICD-10-CM | POA: Diagnosis not present

## 2023-06-10 DIAGNOSIS — L814 Other melanin hyperpigmentation: Secondary | ICD-10-CM | POA: Diagnosis not present

## 2023-06-26 ENCOUNTER — Other Ambulatory Visit: Payer: Self-pay | Admitting: Cardiovascular Disease

## 2023-06-26 DIAGNOSIS — I480 Paroxysmal atrial fibrillation: Secondary | ICD-10-CM

## 2023-06-26 NOTE — Telephone Encounter (Signed)
Prescription refill request for Eliquis received. Indication: Aflutter Last office visit:04/30/23 [name])  Scr: 1.07 (05/29/23)  Age: 72 Weight: 72.8kg  Appropriate dose. Refill sent.

## 2023-07-10 ENCOUNTER — Encounter: Payer: Self-pay | Admitting: Cardiovascular Disease

## 2023-07-10 MED ORDER — AMLODIPINE BESY-BENAZEPRIL HCL 5-10 MG PO CAPS: 1.0000 | ORAL_CAPSULE | Freq: Every day | ORAL | 3 refills | Status: DC

## 2023-07-15 NOTE — Telephone Encounter (Signed)
TC

## 2023-07-29 DIAGNOSIS — N201 Calculus of ureter: Secondary | ICD-10-CM | POA: Diagnosis not present

## 2023-08-08 DIAGNOSIS — Z08 Encounter for follow-up examination after completed treatment for malignant neoplasm: Secondary | ICD-10-CM | POA: Diagnosis not present

## 2023-08-08 DIAGNOSIS — Z85818 Personal history of malignant neoplasm of other sites of lip, oral cavity, and pharynx: Secondary | ICD-10-CM | POA: Diagnosis not present

## 2023-08-08 DIAGNOSIS — Z923 Personal history of irradiation: Secondary | ICD-10-CM | POA: Diagnosis not present

## 2023-08-26 DIAGNOSIS — I10 Essential (primary) hypertension: Secondary | ICD-10-CM | POA: Diagnosis not present

## 2023-08-26 DIAGNOSIS — I4892 Unspecified atrial flutter: Secondary | ICD-10-CM | POA: Diagnosis not present

## 2023-08-26 DIAGNOSIS — I700 Atherosclerosis of aorta: Secondary | ICD-10-CM | POA: Diagnosis not present

## 2023-08-26 DIAGNOSIS — E7800 Pure hypercholesterolemia, unspecified: Secondary | ICD-10-CM | POA: Diagnosis not present

## 2023-08-26 DIAGNOSIS — Z23 Encounter for immunization: Secondary | ICD-10-CM | POA: Diagnosis not present

## 2023-08-26 DIAGNOSIS — I712 Thoracic aortic aneurysm, without rupture, unspecified: Secondary | ICD-10-CM | POA: Diagnosis not present

## 2023-08-26 DIAGNOSIS — R7303 Prediabetes: Secondary | ICD-10-CM | POA: Diagnosis not present

## 2023-09-18 DIAGNOSIS — M5442 Lumbago with sciatica, left side: Secondary | ICD-10-CM | POA: Diagnosis not present

## 2023-09-18 DIAGNOSIS — M25552 Pain in left hip: Secondary | ICD-10-CM | POA: Diagnosis not present

## 2023-10-06 DIAGNOSIS — M5416 Radiculopathy, lumbar region: Secondary | ICD-10-CM | POA: Diagnosis not present

## 2023-10-31 ENCOUNTER — Encounter: Payer: Self-pay | Admitting: Cardiovascular Disease

## 2023-10-31 ENCOUNTER — Ambulatory Visit: Payer: Medicare Other | Attending: Cardiovascular Disease | Admitting: Cardiovascular Disease

## 2023-10-31 VITALS — BP 100/66 | HR 62 | Ht 72.0 in | Wt 165.0 lb

## 2023-10-31 DIAGNOSIS — E782 Mixed hyperlipidemia: Secondary | ICD-10-CM

## 2023-10-31 DIAGNOSIS — I10 Essential (primary) hypertension: Secondary | ICD-10-CM

## 2023-10-31 DIAGNOSIS — I4892 Unspecified atrial flutter: Secondary | ICD-10-CM | POA: Diagnosis not present

## 2023-10-31 DIAGNOSIS — I480 Paroxysmal atrial fibrillation: Secondary | ICD-10-CM

## 2023-10-31 DIAGNOSIS — I7121 Aneurysm of the ascending aorta, without rupture: Secondary | ICD-10-CM

## 2023-10-31 NOTE — Assessment & Plan Note (Signed)
History of ascending thoracic aortic aneurysm measuring 45 mm by 2D echo 04/07/2023.  Will recheck this this coming July.

## 2023-10-31 NOTE — Assessment & Plan Note (Signed)
History of hyperlipidemia on atorvastatin 20 mg a day which was increased from 10 mg since I saw him last.  His last lipid profile performed 02/25/2023 revealed total cholesterol of 158, LDL of 93 and HDL 52.  He is not at goal for secondary prevention given his elevated coronary calcium score of 339.  We will recheck a lipid liver profile.

## 2023-10-31 NOTE — Assessment & Plan Note (Signed)
History of PAF status post outpatient DC cardioversion by Dr. Carolan Clines 04/11/2023 maintaining sinus rhythm on Eliquis oral anticoagulation.

## 2023-10-31 NOTE — Assessment & Plan Note (Signed)
History of essential hypertension her blood pressure measured today at 100/66.  He is on amlodipine, benazepril.

## 2023-10-31 NOTE — Progress Notes (Signed)
10/31/2023 Patrick Nelson   08-Mar-1951  [id number]  Primary Physician Georgann Housekeeper, MD Primary Cardiologist: Runell Gess MD Nicholes Calamity, [place]  HPI:  Patrick Nelson is a 73 y.o.   widowed Caucasian male father of one child, a grandfather with 3 grandchildren referred by Dr. Georgann Housekeeper  at [facility] for evaluation of one episode of paroxysmal atrial fibrillation. I last saw him in the office 04/30/2023.  He is accompanied by his girlfriend [name] today.  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  Mr. Seydel has no cardiovascular risk factors other than hyperlipidemia on statin therapy. He has never had a heart attack or stroke. There is no family history. He denies chest pain or shortness of breath. He had a viral illness possible month ago and was seen in urgent care at which time he was noted to have an irregular heart rhythm and EKG apparently showed atrial fibrillation. This actually spontaneously converted to sinus rhythm and has not recurred until recently when he was hospitalized with pneumonia and had PAF as well which converted spontaneously. Chest x-ray showed a large left sternal border and a CT abdomen was suggested by radiology to rule out upper echo abdominal aneurysm. He is recovered from his pneumonia and feels clinically well. He is active and exercises on a daily basis.      He had throat cancer and underwent chemotherapy and radiation therapy 3 years ago and apparently has "never bounced back".  He saw his PCP a week ago at which time his blood pressure was elevated and he was found to be in a flutter.  He was placed on antihypertensive medications and be again on Eliquis oral anticoagulation.  He did notice some hematuria and apparently is seeing a urologist as well.   I arranged him to undergo outpatient cardioversion 04/11/2023 by Dr. Carolan Clines which was successful.  He remains in sinus rhythm on  Eliquis.  He can tell a difference in sinus rhythm.  He has more energy.  He did have a coronary calcium score performed 04/28/2023 which was 339.  His ascending thoracic aorta measured 43 mm by echo 45 mm.   Since I saw him 6 months ago he has maintained sinus rhythm.  He still pretty active, plays golf, walks and works out in the gym 4-5 times a week without limitation or symptoms.  Current Meds  Medication Sig   amLODipine-benazepril (LOTREL) 5-10 MG capsule Take 1 capsule by mouth daily.   amoxicillin (AMOXIL) 500 MG capsule Take four capsules one hour before dental appointment.   apixaban (ELIQUIS) 5 MG TABS tablet TAKE 1 TABLET BY MOUTH TWICE A DAY   atorvastatin (LIPITOR) 20 MG tablet Take 0.5 tablets (10 mg total) by mouth in the morning.   Cholecalciferol (VITAMIN D3 SUPER STRENGTH) 50 MCG (2000 UT) TABS Take 2,000 Units by mouth daily in the afternoon.   Chromium Picolinate (CHROMIUM PICOLATE PO) Take 1 tablet by mouth daily in the afternoon.   Coenzyme Q10 (COQ10) 100 MG CAPS Take 100 mg by mouth daily in the afternoon.   cyanocobalamin 1000 MCG tablet Take 1,000 mcg by mouth daily in the afternoon.   ELDERBERRY-VITAMIN C-ZINC PO Take 1 capsule by mouth daily in the afternoon.   Magnesium Oxide -Mg Supplement (MAG-200) 200 MG TABS Take 200 mg by mouth daily in the afternoon.   Multiple Vitamins-Minerals (IMMUNE SUPPORT PO) Take 1 capsule by mouth daily  in the afternoon. Kyolic Aged Garlic Extract Immune Formula 103   SODIUM FLUORIDE 5000 PPM 1.1 % PSTE Place 1 Application onto teeth at bedtime.   tamsulosin (FLOMAX) 0.4 MG CAPS capsule Take 1 capsule (0.4 mg total) by mouth at bedtime.     No Known Allergies  Social History   Socioeconomic History   Marital status: Widowed    Spouse name: Not on file   Number of children: 1   Years of education: Not on file   Highest education level: Not on file  Occupational History   Not on file  Tobacco Use   Smoking status: Never    Smokeless tobacco: Never  Vaping Use   Vaping status: Never Used  Substance and Sexual Activity   Alcohol use: Yes   Drug use: No   Sexual activity: Yes  Other Topics Concern   Not on file  Social History Narrative   Not on file   Social Drivers of Health   Financial Resource Strain: Low Risk  (07/20/2020)   Overall Financial Resource Strain (CARDIA)    Difficulty of Paying Living Expenses: Not hard at all  Food Insecurity: Low Risk  (12/12/2022)   [facility]   Hunger Vital Sign    Worried About Running Out of Food in the Last Year: Never true    Ran Out of Food in the Last Year: Never true  Transportation Needs: No Transportation Needs (12/12/2022)   [facility]   Transportation    In the past 12 months, has lack of reliable transportation kept you from medical appointments, meetings, work or from getting things needed for daily living? : No  Physical Activity: Not on file  Stress: Not on file  Social Connections: Moderately Isolated (07/20/2020)   Social Connection and Isolation Panel [NHANES]    Frequency of Communication with Friends and Family: More than three times a week    Frequency of Social Gatherings with Friends and Family: More than three times a week    Attends Religious Services: Never    Database administrator or Organizations: No    Attends Engineer, structural: Not on file    Marital Status: Married  Catering manager Violence: Not on file     Review of Systems: General: negative for chills, fever, night sweats or weight changes.  Cardiovascular: negative for chest pain, dyspnea on exertion, edema, orthopnea, palpitations, paroxysmal nocturnal dyspnea or shortness of breath Dermatological: negative for rash Respiratory: negative for cough or wheezing Urologic: negative for hematuria Abdominal: negative for nausea, vomiting, diarrhea, bright red blood per rectum, melena, or  hematemesis Neurologic: negative for visual changes, syncope, or dizziness All other systems reviewed and are otherwise negative except as noted above.    Blood pressure 100/66, pulse 62, height 6' (1.829 m), weight 165 lb (74.8 kg), SpO2 95%.  General appearance: alert and no distress Neck: no adenopathy, no carotid bruit, no JVD, supple, symmetrical, trachea midline, and thyroid not enlarged, symmetric, no tenderness/mass/nodules Lungs: clear to auscultation bilaterally Heart: regular rate and rhythm, S1, S2 normal, no murmur, click, rub or gallop Extremities: extremities normal, atraumatic, no cyanosis or edema Pulses: 2+ and symmetric Skin: Skin color, texture, turgor normal. No rashes or lesions Neurologic: Grossly normal  EKG EKG Interpretation Date/Time:  Friday October 31 2023 15:05:03 EST Ventricular Rate:  62 PR Interval:  234 QRS Duration:  74 QT Interval:  378 QTC Calculation: 383 R Axis:   -  48  Text Interpretation: Sinus rhythm with 1st degree A-V block Left axis deviation When compared with ECG of 30-Apr-2023 14:15, Premature ventricular complexes are no longer Present Confirmed by Nanetta Batty [phone]) on 10/31/2023 3:16:07 PM    ASSESSMENT AND PLAN:   Paroxysmal atrial fibrillation (HCC) History of PAF status post outpatient DC cardioversion by Dr. Carolan Clines 04/11/2023 maintaining sinus rhythm on Eliquis oral anticoagulation.  Hyperlipidemia History of hyperlipidemia on atorvastatin 20 mg a day which was increased from 10 mg since I saw him last.  His last lipid profile performed 02/25/2023 revealed total cholesterol of 158, LDL of 93 and HDL 52.  He is not at goal for secondary prevention given his elevated coronary calcium score of 339.  We will recheck a lipid liver profile.  Thoracic aortic aneurysm [facility]) History of ascending thoracic aortic aneurysm measuring 45 mm by 2D echo 04/07/2023.  Will recheck this this coming July.  Essential hypertension History  of essential hypertension her blood pressure measured today at 100/66.  He is on amlodipine, benazepril.     Runell Gess MD FACP,FACC,FAHA, [facility] [place] 10/31/2023 3:25 PM

## 2023-10-31 NOTE — Patient Instructions (Addendum)
    Lab Work:  Your physician recommends that you return for lab work WHEN FASTING  If you have labs (blood work) drawn today and your tests are completely normal, you will receive your results only by: [employer] (if you have MyChart) OR A paper copy in the mail If you have any lab test that is abnormal or we need to change your treatment, we will call you to review the results.   Your physician has requested that you have an echocardiogram. Echocardiography is a painless test that uses sound waves to create images of your heart. It provides your doctor with information about the size and shape of your heart and how well your heart's chambers and valves are working. This procedure takes approximately one hour. There are no restrictions for this procedure. Please do NOT wear cologne, perfume, aftershave, or lotions (deodorant is allowed). Please arrive 15 minutes prior to your appointment time.  Please note: We ask at that you not bring children with you during ultrasound (echo/ vascular) testing. Due to room size and safety concerns, children are not allowed in the ultrasound rooms during exams. Our front office staff cannot provide observation of children in our lobby area while testing is being conducted. An adult accompanying a patient to their appointment will only be allowed in the ultrasound room at the discretion of the ultrasound technician under special circumstances. We apologize for any inconvenience. [address]-SCHEDULE IN JULY 2025  Follow-Up: At [facility], you and your health needs are our priority.  As part of our continuing mission to provide you with exceptional heart care, we have created designated Provider Care Teams.  These Care Teams include your primary Cardiologist (physician) and Advanced Practice Providers (APPs -  Physician Assistants and Nurse Practitioners) who all work together to provide you with the care you need, when you need  it.  We recommend signing up for the patient portal called "MyChart".  Sign up information is provided on this After Visit Summary.  MyChart is used to connect with patients for Virtual Visits (Telemedicine).  Patients are able to view lab/test results, encounter notes, upcoming appointments, etc.  Non-urgent messages can be sent to your provider as well.   To learn more about what you can do with MyChart, go to [email or web address].    Your next appointment:   6 month(s)  Provider:   ANY APP    Then, Nanetta Batty MD IN 1 YEAR will plan to see you again in 12 month(s).

## 2023-11-04 DIAGNOSIS — E782 Mixed hyperlipidemia: Secondary | ICD-10-CM | POA: Diagnosis not present

## 2023-11-04 LAB — HEPATIC FUNCTION PANEL
ALT: 17 [IU]/L (ref 0–44)
AST: 19 [IU]/L (ref 0–40)
Albumin: 4.2 g/dL (ref 3.8–4.8)
Alkaline Phosphatase: 90 [IU]/L (ref 44–121)
Bilirubin Total: 0.5 mg/dL (ref 0.0–1.2)
Bilirubin, Direct: 0.2 mg/dL (ref 0.00–0.40)
Total Protein: 5.9 g/dL — ABNORMAL LOW (ref 6.0–8.5)

## 2023-11-04 LAB — LIPID PANEL
Chol/HDL Ratio: 2.9 {ratio} (ref 0.0–5.0)
Cholesterol, Total: 174 mg/dL (ref 100–199)
HDL: 60 mg/dL (ref >39)
LDL Chol Calc (NIH): 104 mg/dL — ABNORMAL HIGH (ref 0–99)
Triglycerides: 52 mg/dL (ref 0–149)
VLDL Cholesterol Cal: 10 mg/dL (ref 5–40)

## 2023-11-05 ENCOUNTER — Telehealth: Payer: Self-pay | Admitting: *Deleted

## 2023-11-05 NOTE — Telephone Encounter (Signed)
 Good Morning Dr. Court  We have received a surgical clearance request for Patrick Nelson for lumbar ESI procedures. They were seen recently in clinic on 10/31/2023. Can you please comment on surgical clearance for his upcoming procedure.  Please forward you guidance and recommendations to [facility] PREOP   Thank you, Jackee Alberts, NP

## 2023-11-05 NOTE — Telephone Encounter (Signed)
   Pre-operative Risk Assessment    Patient Name: Patrick Nelson  DOB: June 18, 1951 MRN: [id number]   Date of last office visit: 10/31/23 DR. BERRY Date of next office visit: NONE   Request for Surgical Clearance    Procedure: LEFT L3-4 AND L4-5 TFESI  Date of Surgery:  Clearance TBD                                Surgeon:  DR. SCOT FLATTEN Surgeon's Group or Practice Name:  [facility] Phone number:  [phone] [facility] Fax number:  [phone]   Type of Clearance Requested:   - Medical  - Pharmacy:  Hold Apixaban  (Eliquis )     Type of Anesthesia:  Not Indicated   Additional requests/questions:    Bonney Niels Jest   11/05/2023, 10:16 AM

## 2023-11-05 NOTE — Telephone Encounter (Signed)
 Pharmacy please advise on holding Eliquis  prior to LEFT L3-4 AND L4-5 TFESI  scheduled for TBD. Thank you.

## 2023-11-07 ENCOUNTER — Other Ambulatory Visit: Payer: Self-pay

## 2023-11-07 ENCOUNTER — Telehealth: Payer: Self-pay | Admitting: Cardiovascular Disease

## 2023-11-07 DIAGNOSIS — R931 Abnormal findings on diagnostic imaging of heart and coronary circulation: Secondary | ICD-10-CM

## 2023-11-07 DIAGNOSIS — E782 Mixed hyperlipidemia: Secondary | ICD-10-CM

## 2023-11-07 MED ORDER — ATORVASTATIN CALCIUM 40 MG PO TABS: 40.0000 mg | ORAL_TABLET | Freq: Every morning | ORAL | 3 refills | Status: DC

## 2023-11-07 NOTE — Telephone Encounter (Signed)
     Primary Cardiologist: None  Chart reviewed as part of pre-operative protocol coverage. Given past medical history and time since last visit, based on ACC/AHA guidelines, Rahim Astorga would be at acceptable risk for the planned procedure without further cardiovascular testing.   Patient with diagnosis of afib on Eliquis  for anticoagulation.     Procedure: LEFT L3-4 AND L4-5 TFESI  Date of procedure: TBD     CHA2DS2-VASc Score = 3   This indicates a 3.2% annual risk of stroke. The patient's score is based upon: CHF History: 0 HTN History: 1 Diabetes History: 0 Stroke History: 0 Vascular Disease History: 1 Age Score: 1 Gender Score: 0       CrCl 64 ml/min Platelet count 192   Per office protocol, patient can hold Eliquis  for 3 days prior to procedure.   I will route this recommendation to the requesting party via Epic fax function and remove from pre-op pool.  Please call with questions.  Patrick Nelson. Ziair Penson NP-C     11/07/2023, 9:00 AM [facility] [address] Office [phone] Fax [phone]

## 2023-11-07 NOTE — Telephone Encounter (Signed)
 Patient with diagnosis of afib on Eliquis  for anticoagulation.    Procedure: LEFT L3-4 AND L4-5 TFESI  Date of procedure: TBD   CHA2DS2-VASc Score = 3   This indicates a 3.2% annual risk of stroke. The patient's score is based upon: CHF History: 0 HTN History: 1 Diabetes History: 0 Stroke History: 0 Vascular Disease History: 1 Age Score: 1 Gender Score: 0      CrCl 64 ml/min Platelet count 192  Per office protocol, patient can hold Eliquis  for 3 days prior to procedure.    **This guidance is not considered finalized until pre-operative APP has relayed final recommendations.**

## 2023-11-07 NOTE — Telephone Encounter (Signed)
 Responded to pt via My chart

## 2023-11-07 NOTE — Telephone Encounter (Signed)
 Patient returned RN's call regarding results.  Patient noted he responded to message in MyChart as well.

## 2023-11-13 ENCOUNTER — Encounter: Payer: Self-pay | Admitting: Cardiovascular Disease

## 2023-11-20 ENCOUNTER — Ambulatory Visit (HOSPITAL_COMMUNITY): Payer: Medicare Other | Attending: Cardiology

## 2023-11-20 ENCOUNTER — Encounter: Payer: Self-pay | Admitting: *Deleted

## 2023-11-20 ENCOUNTER — Other Ambulatory Visit: Payer: Self-pay | Admitting: *Deleted

## 2023-11-20 DIAGNOSIS — I7121 Aneurysm of the ascending aorta, without rupture: Secondary | ICD-10-CM | POA: Diagnosis not present

## 2023-11-20 LAB — ECHOCARDIOGRAM COMPLETE
Area-P 1/2: 3.19 cm2
S' Lateral: 2.6 cm

## 2023-11-26 DIAGNOSIS — M5416 Radiculopathy, lumbar region: Secondary | ICD-10-CM | POA: Diagnosis not present

## 2023-12-03 DIAGNOSIS — M79671 Pain in right foot: Secondary | ICD-10-CM | POA: Diagnosis not present

## 2023-12-03 DIAGNOSIS — M25572 Pain in left ankle and joints of left foot: Secondary | ICD-10-CM | POA: Diagnosis not present

## 2023-12-03 DIAGNOSIS — M79672 Pain in left foot: Secondary | ICD-10-CM | POA: Diagnosis not present

## 2023-12-03 DIAGNOSIS — M25571 Pain in right ankle and joints of right foot: Secondary | ICD-10-CM | POA: Diagnosis not present

## 2023-12-22 ENCOUNTER — Other Ambulatory Visit: Payer: Self-pay | Admitting: Hematology and Oncology

## 2023-12-22 ENCOUNTER — Inpatient Hospital Stay: Payer: Medicare Other | Admitting: Hematology and Oncology

## 2023-12-22 ENCOUNTER — Inpatient Hospital Stay: Payer: Medicare Other | Attending: Internal Medicine

## 2023-12-22 ENCOUNTER — Telehealth: Payer: Self-pay | Admitting: Hematology and Oncology

## 2023-12-22 VITALS — BP 121/91 | HR 72 | Temp 98.4°F | Resp 14 | Wt 163.9 lb

## 2023-12-22 DIAGNOSIS — Z95828 Presence of other vascular implants and grafts: Secondary | ICD-10-CM

## 2023-12-22 DIAGNOSIS — C01 Malignant neoplasm of base of tongue: Secondary | ICD-10-CM | POA: Insufficient documentation

## 2023-12-22 DIAGNOSIS — Z79899 Other long term (current) drug therapy: Secondary | ICD-10-CM | POA: Insufficient documentation

## 2023-12-22 DIAGNOSIS — Z85810 Personal history of malignant neoplasm of tongue: Secondary | ICD-10-CM | POA: Insufficient documentation

## 2023-12-22 DIAGNOSIS — Z923 Personal history of irradiation: Secondary | ICD-10-CM | POA: Insufficient documentation

## 2023-12-22 DIAGNOSIS — Z7901 Long term (current) use of anticoagulants: Secondary | ICD-10-CM | POA: Diagnosis not present

## 2023-12-22 DIAGNOSIS — I480 Paroxysmal atrial fibrillation: Secondary | ICD-10-CM | POA: Diagnosis not present

## 2023-12-22 LAB — CMP (CANCER CENTER ONLY)
ALT: 17 U/L (ref 0–44)
AST: 22 U/L (ref 15–41)
Albumin: 4.1 g/dL (ref 3.5–5.0)
Alkaline Phosphatase: 78 U/L (ref 38–126)
Anion gap: 5 (ref 5–15)
BUN: 22 mg/dL (ref 8–23)
CO2: 28 mmol/L (ref 22–32)
Calcium: 9.3 mg/dL (ref 8.9–10.3)
Chloride: 104 mmol/L (ref 98–111)
Creatinine: 0.83 mg/dL (ref 0.61–1.24)
GFR, Estimated: >60 mL/min (ref >60)
Glucose, Bld: 96 mg/dL (ref 70–99)
Potassium: 4.3 mmol/L (ref 3.5–5.1)
Sodium: 137 mmol/L (ref 135–145)
Total Bilirubin: 0.7 mg/dL (ref 0.0–1.2)
Total Protein: 6.2 g/dL — ABNORMAL LOW (ref 6.5–8.1)

## 2023-12-22 LAB — CBC WITH DIFFERENTIAL (CANCER CENTER ONLY)
Abs Immature Granulocytes: 0.02 10*3/uL (ref 0.00–0.07)
Basophils Absolute: 0 10*3/uL (ref 0.0–0.1)
Basophils Relative: 1 %
Eosinophils Absolute: 0.1 10*3/uL (ref 0.0–0.5)
Eosinophils Relative: 2 %
HCT: 43 % (ref 39.0–52.0)
Hemoglobin: 14.6 g/dL (ref 13.0–17.0)
Immature Granulocytes: 1 %
Lymphocytes Relative: 29 %
Lymphs Abs: 1.2 10*3/uL (ref 0.7–4.0)
MCH: 30.9 pg (ref 26.0–34.0)
MCHC: 34 g/dL (ref 30.0–36.0)
MCV: 90.9 fL (ref 80.0–100.0)
Monocytes Absolute: 0.5 10*3/uL (ref 0.1–1.0)
Monocytes Relative: 12 %
Neutro Abs: 2.4 10*3/uL (ref 1.7–7.7)
Neutrophils Relative %: 55 %
Platelet Count: 185 10*3/uL (ref 150–400)
RBC: 4.73 MIL/uL (ref 4.22–5.81)
RDW: 13.2 % (ref 11.5–15.5)
WBC Count: 4.2 10*3/uL (ref 4.0–10.5)
nRBC: 0 % (ref 0.0–0.2)

## 2023-12-22 LAB — TSH: TSH: 2.848 u[IU]/mL (ref 0.350–4.500)

## 2023-12-22 NOTE — Progress Notes (Signed)
 [facility] [phone]) [phone]   [phone]  PROGRESS NOTE  Patient Care Team: Georgann Housekeeper, MD as PCP - General (Internal Medicine) Malmfelt, Lise Auer, RN as Oncology Nurse Navigator Lonie Peak, MD as Consulting Physician (Radiation Oncology) Jaci Standard, MD as Consulting Physician (Hematology and Oncology) Barron Alvine, CCC-SLP as Speech Language Pathologist (Speech Pathology) Jeanella Craze, Remi Deter, PT as Physical Therapist (Physical Therapy) Sallee Lange, LCSW (Inactive) as Social Worker (General Practice) Anabel Bene, RD as Dietitian (Nutrition) Serena Colonel, MD as Consulting Physician (Otolaryngology) Pollyann Samples, NP as Nurse Practitioner (Nurse Practitioner)  Hematological/Oncological History 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 Mass biopsied at the right base of the tongue. Biopsy showed invasive moderately differentiated squamous cell carcinoma 3) 06/06/2020: PET CT scan showed hypermetabolic right tongue base mass consistent with known neoplasm. Right level 2 and level 3 metastatic adenopathy. No evidence of metastatic disease.  4) 06/09/2020: established care with Dr. Basilio Cairo 5) 06/12/2020: establish care with Dr. Leonides Schanz  6) 07/03/2020: Week 1 of Cisplatin chemoradiation 7) 07/10/2020: Week 2 of Cisplatin chemoradiation 8) 07/17/2020: Week 3 of Cisplatin chemoradiation 9) 07/24/2020: Week 4 of Cisplatin chemoradiation 10) 07/31/2020: Week 5 of Cisplatin chemoradiation 11) 08/07/2020: Week 6 of Cisplatin chemoradiation 12) 08/14/2020: Week 7 of Cisplatin chemoradiation 13) 08/18/2020: end of radiation therapy.  14) 11/24/2020: PET CT scan shows a complete remission, no  FDG avid lesions or concern for residual/recurrent disease.   Interval History:  Patrick Nelson 73 y.o. male with medical history significant for SCC of the base of the tongue who presents for a follow up visit. The patient's last visit was on 12/16/2022. In the interim since the last visit he has had no major changes in his health.  At today's exam, [name] reports he has been well overall in the interim since her last visit 1 year ago.  He notes that he did have issues with his heart being out of rhythm and had to undergo a cardioversion with sedation.  He reports that he is had no issues with viral illnesses such as flu or norovirus.  He reports that his energy and appetite have both been good and he is having no trouble with swallowing, eating, or drinking.  His weight has remained stable in the 160s.  He is 163 pounds today, having been under 60 pounds in August 2024.  He reports he is golfing and has been quite active.  He is golfed 18 holes.  He notes he does have an upcoming trip to [place] to visit his son's hockey team in [place]. He denies fevers, chills, night sweats, shortness of breath, chest pain or cough.  He has no other complaints.  Rest of the 10 point ROS is below.  MEDICAL HISTORY:  Past Medical History:  Diagnosis Date   Hyperlipidemia    Paroxysmal atrial fibrillation [facility])     SURGICAL HISTORY: Past Surgical History:  Procedure Laterality Date   CARDIOVERSION N/A 04/11/2023   Procedure: CARDIOVERSION;  Surgeon: [name], MD;  Location: [facility];  Service: Cardiovascular;  Laterality: N/A;   DIRECT LARYNGOSCOPY N/A 05/22/2020   Procedure: DIRECT LARYNGOSCOPY w/BIOPSY TONGUE BASE;  Surgeon:  Serena Colonel, MD;  Location: [facility];  Service: ENT;  Laterality: N/A;   EXTRACORPOREAL SHOCK WAVE LITHOTRIPSY Left 05/16/2023   Procedure: EXTRACORPOREAL SHOCK WAVE LITHOTRIPSY (ESWL);  Surgeon: Noel Christmas, MD;  Location: [facility];  Service: Urology;  Laterality: Left;   IR GASTROSTOMY TUBE MOD SED  06/26/2020   IR GASTROSTOMY TUBE REMOVAL  12/25/2020   IR IMAGING GUIDED PORT INSERTION  06/26/2020   IR REMOVAL TUN ACCESS W/ PORT W/O FL MOD SED  12/25/2020   TOTAL HIP ARTHROPLASTY  2012   right hip-Dr. Luiz Blare    SOCIAL HISTORY: Social History   Socioeconomic History   Marital status: Widowed    Spouse name: Not on file   Number of children: 1   Years of education: Not on file   Highest education level: Not on file  Occupational History   Not on file  Tobacco Use   Smoking status: Never   Smokeless tobacco: Never  Vaping Use   Vaping status: Never Used  Substance and Sexual Activity   Alcohol use: Yes   Drug use: No   Sexual activity: Yes  Other Topics Concern   Not on file  Social History Narrative   Not on file   Social Drivers of Health   Financial Resource Strain: Low Risk  (07/20/2020)   Overall Financial Resource Strain (CARDIA)    Difficulty of Paying Living Expenses: Not hard at all  Food Insecurity: Low Risk  (12/12/2022)   [facility]   Hunger Vital Sign    Worried About Running Out of Food in the Last Year: Never true    Ran Out of Food in the Last Year: Never true  Transportation Needs: No Transportation Needs (12/12/2022)   [facility]   Transportation    In the past 12 months, has lack of reliable transportation kept you from medical appointments, meetings, work or from getting things needed for daily living? : No  Physical Activity: Not on file  Stress: Not on file  Social Connections: Moderately Isolated (07/20/2020)   Social Connection and Isolation Panel [NHANES]    Frequency of Communication with Friends and Family: More than three times a week    Frequency of Social Gatherings with Friends and Family: More than three times a week    Attends Religious Services: Never    Database administrator or Organizations:  No    Attends Engineer, structural: Not on file    Marital Status: Married  Catering manager Violence: Not on file    FAMILY HISTORY: Family History  Problem Relation Age of Onset   Hypertension Mother    Heart disease Mother    Heart disease Brother     ALLERGIES:  has no known allergies.  MEDICATIONS:  Current Outpatient Medications  Medication Sig Dispense Refill   amLODipine-benazepril (LOTREL) 5-10 MG capsule Take 1 capsule by mouth daily. 90 capsule 3   amoxicillin (AMOXIL) 500 MG capsule Take four capsules one hour before dental appointment. 4 capsule 3   apixaban (ELIQUIS) 5 MG TABS tablet TAKE 1 TABLET BY MOUTH TWICE A DAY 60 tablet 5   atorvastatin (LIPITOR) 40 MG tablet Take 1 tablet (40 mg total) by mouth in the morning. 90 tablet 3   Cholecalciferol (VITAMIN D3 SUPER STRENGTH) 50 MCG (2000 UT) TABS Take 2,000 Units by mouth daily in the afternoon.     Chromium Picolinate (CHROMIUM PICOLATE PO)  Take 1 tablet by mouth daily in the afternoon.     Coenzyme Q10 (COQ10) 100 MG CAPS Take 100 mg by mouth daily in the afternoon.     cyanocobalamin 1000 MCG tablet Take 1,000 mcg by mouth daily in the afternoon.     ELDERBERRY-VITAMIN C-ZINC PO Take 1 capsule by mouth daily in the afternoon.     Magnesium Oxide -Mg Supplement (MAG-200) 200 MG TABS Take 200 mg by mouth daily in the afternoon.     Multiple Vitamins-Minerals (IMMUNE SUPPORT PO) Take 1 capsule by mouth daily in the afternoon. Kyolic Aged Garlic Extract Immune Formula 103     SODIUM FLUORIDE 5000 PPM 1.1 % PSTE Place 1 Application onto teeth at bedtime.     tamsulosin (FLOMAX) 0.4 MG CAPS capsule Take 1 capsule (0.4 mg total) by mouth at bedtime. 30 capsule 0   No current facility-administered medications for this visit.    REVIEW OF SYSTEMS:   Constitutional: ( - ) fevers, ( - )  chills , ( - ) night sweats Eyes: ( - ) blurriness of vision, ( - ) double vision, ( - ) watery eyes Ears, nose, mouth,  throat, and face: ( - ) mucositis, ( - ) sore throat Respiratory: ( - ) cough, ( - ) dyspnea, ( - ) wheezes Cardiovascular: ( - ) palpitation, ( - ) chest discomfort, ( - ) lower extremity swelling Gastrointestinal:  ( - ) nausea, ( - ) heartburn, ( - ) change in bowel habits Skin: ( - ) abnormal skin rashes Lymphatics: ( - ) new lymphadenopathy, ( - ) easy bruising Neurological: ( - ) numbness, ( - ) tingling, ( - ) new weaknesses Behavioral/Psych: ( - ) mood change, ( - ) new changes  All other systems were reviewed with the patient and are negative.  PHYSICAL EXAMINATION: ECOG PERFORMANCE STATUS: 0 - Asymptomatic  Vitals:   12/22/23 1139  BP: (!) 121/91  Pulse: 72  Resp: 14  Temp: 98.4 F (36.9 C)  SpO2: 99%     Filed Weights   12/22/23 1139  Weight: 163 lb 14.4 oz (74.3 kg)      GENERAL: well appearing elderly Caucasian male alert, no distress and comfortable SKIN:  Normal tugor, no more erythema around neck. No skin lesions noted. EYES: conjunctiva are pink and non-injected, sclera clear OROPHARYNX: No evidence of ulcer/infection.  LUNGS: clear to auscultation and percussion with normal breathing effort HEART: regular rate & rhythm and no murmurs and no lower extremity edema Musculoskeletal: no cyanosis of digits and no clubbing  PSYCH: alert & oriented x 3, fluent speech NEURO: no focal motor/sensory deficits  LABORATORY DATA:  I have reviewed the data as listed    Latest Ref Rng & Units 12/22/2023   11:03 AM 05/29/2023   12:10 PM 04/24/2023   11:25 AM  CBC  WBC 4.0 - 10.5 K/uL 4.2  4.7  4.6   Hemoglobin 13.0 - 17.0 g/dL 02.7  25.3  66.4   Hematocrit 39.0 - 52.0 % 43.0  43.8  42.4   Platelets 150 - 400 K/uL 185  192  227        Latest Ref Rng & Units 12/22/2023   11:03 AM 11/04/2023    9:45 AM 05/29/2023   12:10 PM  CMP  Glucose 70 - 99 mg/dL 96   86   BUN 8 - 23 mg/dL 22   16   Creatinine 4.03 - 1.24 mg/dL 4.74   2.59  Sodium 135 - 145 mmol/L 137   140    Potassium 3.5 - 5.1 mmol/L 4.3   4.1   Chloride 98 - 111 mmol/L 104   106   CO2 22 - 32 mmol/L 28   30   Calcium 8.9 - 10.3 mg/dL 9.3   9.5   Total Protein 6.5 - 8.1 g/dL 6.2  5.9  6.3   Total Bilirubin 0.0 - 1.2 mg/dL 0.7  0.5  0.7   Alkaline Phos 38 - 126 U/L 78  90  88   AST 15 - 41 U/L 22  19  27    ALT 0 - 44 U/L 17  17  24      RADIOGRAPHIC STUDIES: No results found.   ASSESSMENT & PLAN Shed Nixon 73 y.o. male with medical history significant for SCC of the base of the tongue who presents for a follow up visit.  Review the labs, review the records, discussion with the patient the findings are most consistent with a squamous cell carcinoma of the back base of the tongue.  This was a T1 N1 M0 p16 positive cancer which makes it stage I.  Overall the prognosis for this is quite well and there is an excellent chance of durable remission after chemoradiation therapy with cisplatin.   Previously we discussed treatment moving forward and the expected side effects and symptoms.  Side effects include but are not limited to fatigue, anemia, kidney dysfunction, nausea, vomiting, diarrhea, throat pain, weight loss, and neuropathy.  Patient his wife were agreeable to proceeding with treatment.  We also discussed the medications to help with potential side effects including Zofran as needed, Compazine as needed, and EMLA cream.    The treatment of choice for this patient will be radiation with concurrent cisplatin 40 mg per metered squared weekly x7 doses.  We started this on 07/03/2020 and had weekly visits with the patient while he is receiving this treatment. He completed chemotherapy on 08/14/2020.   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 P16+, Stage I  --patient completed 7 weekly doses of cisplatin 40mg /m2 administered with radiation on 08/18/2020 --PET CT scan on 11/24/2020 shows that the patient is in a complete remission. Proceed with fibro-optic exams with  ENT/Rad Onc moving forward.  --Last seen by Dr. Pollyann Kennedy, ENT, on 08/18/2023 with no evidence of recurrent disease. Planning for RTC in 6 months time (May 2025) --Last seen by Dr. Basilio Cairo, RadOnc, on 12/04/2021. Plan to return PRN.  --Labs today were reviewed without any intervention needed.  Labs show white blood cell count 4.2, hemoglobin 14.6, MCV 90.9, platelets 185 --RTC in 12 months.   #Supportive Care --PEG and Port removed --erythematous radiation rash resolved with topical treatments.   No orders of the defined types were placed in this encounter.  All questions were answered. The patient knows to call the clinic with any problems, questions or concerns.  I have spent a total of 25 minutes minutes of face-to-face and non-face-to-face time, preparing to see the patient, performing a medically appropriate examination, counseling and educating the patient, ordering tests, documenting clinical information in the electronic health record,and care coordination.   Ulysees Barns, MD Department of Hematology/Oncology [facility] at [facility] Phone: [phone] Pager: [phone] Email: [email or web address]    12/22/2023 5:33 PM

## 2023-12-22 NOTE — Telephone Encounter (Signed)
 Left Message - Left detailed message of appt details. Informed patient to call back if appointments do not work.

## 2023-12-23 ENCOUNTER — Other Ambulatory Visit: Payer: Self-pay | Admitting: Cardiovascular Disease

## 2023-12-23 DIAGNOSIS — I480 Paroxysmal atrial fibrillation: Secondary | ICD-10-CM

## 2023-12-23 LAB — T4: T4, Total: 6.6 ug/dL (ref 4.5–12.0)

## 2023-12-23 NOTE — Telephone Encounter (Signed)
 Eliquis 5mg  refill request received. Patient is 73 years old, weight-74.3kg, Crea-0.83 on 12/22/23, Diagnosis-Aflutter/afib, and last seen by Dr. [name] on 10/31/23. Dose is appropriate based on dosing criteria. Will send in refill to requested pharmacy.

## 2024-01-09 DIAGNOSIS — M5416 Radiculopathy, lumbar region: Secondary | ICD-10-CM | POA: Diagnosis not present

## 2024-02-03 DIAGNOSIS — H5203 Hypermetropia, bilateral: Secondary | ICD-10-CM | POA: Diagnosis not present

## 2024-02-03 DIAGNOSIS — H02831 Dermatochalasis of right upper eyelid: Secondary | ICD-10-CM | POA: Diagnosis not present

## 2024-02-03 DIAGNOSIS — H524 Presbyopia: Secondary | ICD-10-CM | POA: Diagnosis not present

## 2024-02-03 DIAGNOSIS — H35363 Drusen (degenerative) of macula, bilateral: Secondary | ICD-10-CM | POA: Diagnosis not present

## 2024-02-03 DIAGNOSIS — H25013 Cortical age-related cataract, bilateral: Secondary | ICD-10-CM | POA: Diagnosis not present

## 2024-02-03 DIAGNOSIS — H2513 Age-related nuclear cataract, bilateral: Secondary | ICD-10-CM | POA: Diagnosis not present

## 2024-02-03 DIAGNOSIS — H11153 Pinguecula, bilateral: Secondary | ICD-10-CM | POA: Diagnosis not present

## 2024-02-03 DIAGNOSIS — H02834 Dermatochalasis of left upper eyelid: Secondary | ICD-10-CM | POA: Diagnosis not present

## 2024-02-28 DIAGNOSIS — I10 Essential (primary) hypertension: Secondary | ICD-10-CM | POA: Diagnosis not present

## 2024-02-28 DIAGNOSIS — E7800 Pure hypercholesterolemia, unspecified: Secondary | ICD-10-CM | POA: Diagnosis not present

## 2024-03-01 ENCOUNTER — Encounter: Payer: Self-pay | Admitting: Cardiovascular Disease

## 2024-03-02 DIAGNOSIS — Z0000 Encounter for general adult medical examination without abnormal findings: Secondary | ICD-10-CM | POA: Diagnosis not present

## 2024-03-02 DIAGNOSIS — I700 Atherosclerosis of aorta: Secondary | ICD-10-CM | POA: Diagnosis not present

## 2024-03-02 DIAGNOSIS — Z23 Encounter for immunization: Secondary | ICD-10-CM | POA: Diagnosis not present

## 2024-03-02 DIAGNOSIS — E7800 Pure hypercholesterolemia, unspecified: Secondary | ICD-10-CM | POA: Diagnosis not present

## 2024-03-02 DIAGNOSIS — R7303 Prediabetes: Secondary | ICD-10-CM | POA: Diagnosis not present

## 2024-03-02 DIAGNOSIS — I719 Aortic aneurysm of unspecified site, without rupture: Secondary | ICD-10-CM | POA: Diagnosis not present

## 2024-03-02 DIAGNOSIS — C029 Malignant neoplasm of tongue, unspecified: Secondary | ICD-10-CM | POA: Diagnosis not present

## 2024-03-02 DIAGNOSIS — I10 Essential (primary) hypertension: Secondary | ICD-10-CM | POA: Diagnosis not present

## 2024-03-02 DIAGNOSIS — I4892 Unspecified atrial flutter: Secondary | ICD-10-CM | POA: Diagnosis not present

## 2024-05-25 ENCOUNTER — Ambulatory Visit: Payer: Medicare Other | Admitting: Nurse Practitioner

## 2024-05-25 ENCOUNTER — Other Ambulatory Visit: Payer: Medicare Other

## 2024-05-30 DIAGNOSIS — I10 Essential (primary) hypertension: Secondary | ICD-10-CM | POA: Diagnosis not present

## 2024-05-30 DIAGNOSIS — E7800 Pure hypercholesterolemia, unspecified: Secondary | ICD-10-CM | POA: Diagnosis not present

## 2024-06-02 ENCOUNTER — Inpatient Hospital Stay: Admitting: Hematology and Oncology

## 2024-06-02 ENCOUNTER — Other Ambulatory Visit: Payer: Self-pay | Admitting: Hematology and Oncology

## 2024-06-02 ENCOUNTER — Inpatient Hospital Stay: Attending: Hematology and Oncology

## 2024-06-02 VITALS — BP 125/62 | HR 64 | Temp 97.6°F | Resp 13 | Wt 154.6 lb

## 2024-06-02 DIAGNOSIS — C01 Malignant neoplasm of base of tongue: Secondary | ICD-10-CM | POA: Diagnosis not present

## 2024-06-02 DIAGNOSIS — Z79899 Other long term (current) drug therapy: Secondary | ICD-10-CM | POA: Diagnosis not present

## 2024-06-02 DIAGNOSIS — Z95828 Presence of other vascular implants and grafts: Secondary | ICD-10-CM

## 2024-06-02 DIAGNOSIS — Z7901 Long term (current) use of anticoagulants: Secondary | ICD-10-CM | POA: Insufficient documentation

## 2024-06-02 DIAGNOSIS — Z85810 Personal history of malignant neoplasm of tongue: Secondary | ICD-10-CM | POA: Diagnosis not present

## 2024-06-02 DIAGNOSIS — Z08 Encounter for follow-up examination after completed treatment for malignant neoplasm: Secondary | ICD-10-CM | POA: Diagnosis not present

## 2024-06-02 DIAGNOSIS — I480 Paroxysmal atrial fibrillation: Secondary | ICD-10-CM | POA: Diagnosis not present

## 2024-06-02 LAB — CMP (CANCER CENTER ONLY)
ALT: 22 U/L (ref 0–44)
AST: 26 U/L (ref 15–41)
Albumin: 4.4 g/dL (ref 3.5–5.0)
Alkaline Phosphatase: 85 U/L (ref 38–126)
Anion gap: 5 (ref 5–15)
BUN: 20 mg/dL (ref 8–23)
CO2: 28 mmol/L (ref 22–32)
Calcium: 9.8 mg/dL (ref 8.9–10.3)
Chloride: 105 mmol/L (ref 98–111)
Creatinine: 0.91 mg/dL (ref 0.61–1.24)
GFR, Estimated: >60 mL/min (ref >60)
Glucose, Bld: 86 mg/dL (ref 70–99)
Potassium: 4.6 mmol/L (ref 3.5–5.1)
Sodium: 138 mmol/L (ref 135–145)
Total Bilirubin: 0.9 mg/dL (ref 0.0–1.2)
Total Protein: 7 g/dL (ref 6.5–8.1)

## 2024-06-02 LAB — CBC WITH DIFFERENTIAL (CANCER CENTER ONLY)
Abs Immature Granulocytes: 0.02 K/uL (ref 0.00–0.07)
Basophils Absolute: 0 K/uL (ref 0.0–0.1)
Basophils Relative: 1 %
Eosinophils Absolute: 0 K/uL (ref 0.0–0.5)
Eosinophils Relative: 1 %
HCT: 45.6 % (ref 39.0–52.0)
Hemoglobin: 15.3 g/dL (ref 13.0–17.0)
Immature Granulocytes: 0 %
Lymphocytes Relative: 27 %
Lymphs Abs: 1.3 K/uL (ref 0.7–4.0)
MCH: 31.3 pg (ref 26.0–34.0)
MCHC: 33.6 g/dL (ref 30.0–36.0)
MCV: 93.3 fL (ref 80.0–100.0)
Monocytes Absolute: 0.5 K/uL (ref 0.1–1.0)
Monocytes Relative: 10 %
Neutro Abs: 2.9 K/uL (ref 1.7–7.7)
Neutrophils Relative %: 61 %
Platelet Count: 221 K/uL (ref 150–400)
RBC: 4.89 MIL/uL (ref 4.22–5.81)
RDW: 13.7 % (ref 11.5–15.5)
WBC Count: 4.7 K/uL (ref 4.0–10.5)
nRBC: 0 % (ref 0.0–0.2)

## 2024-06-02 LAB — TSH: TSH: 3.31 u[IU]/mL (ref 0.350–4.500)

## 2024-06-02 NOTE — Progress Notes (Signed)
 [facility] [phone]) [phone]   Fax:([phone]) [phone]  PROGRESS NOTE  Patient Care Team: Ransom Other, MD as PCP - General (Internal Medicine) Malmfelt, Delon CROME, RN as Oncology Nurse Navigator Izell Domino, MD as Consulting Physician (Radiation Oncology) Federico Norleen ONEIDA MADISON, MD as Consulting Physician (Hematology and Oncology) Jacelyn Lupita NOVAK, CCC-SLP as Speech Language Pathologist (Speech Pathology) Lanis Carbon, Florina CROME, PT as Physical Therapist (Physical Therapy) Stacia Arlean BROCKS, LCSW (Inactive) as Social Worker (General Practice) Daryle Heron CROME, RD as Dietitian (Nutrition) Jesus Oliphant, MD as Consulting Physician (Otolaryngology) Burton, Lacie K, NP as Nurse Practitioner (Nurse Practitioner)  Hematological/Oncological History 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 Mass biopsied at the right base of the tongue. Biopsy showed invasive moderately differentiated squamous cell carcinoma 3) 06/06/2020: PET CT scan showed hypermetabolic right tongue base mass consistent with known neoplasm. Right level 2 and level 3 metastatic adenopathy. No evidence of metastatic disease.  4) 06/09/2020: established care with Dr. Izell 5) 06/12/2020: establish care with Dr. Federico  6) 07/03/2020: Week 1 of Cisplatin  chemoradiation 7) 07/10/2020: Week 2 of Cisplatin  chemoradiation 8) 07/17/2020: Week 3 of Cisplatin  chemoradiation 9) 07/24/2020: Week 4 of Cisplatin  chemoradiation 10) 07/31/2020: Week 5 of Cisplatin  chemoradiation 11) 08/07/2020: Week 6 of Cisplatin  chemoradiation 12) 08/14/2020: Week 7 of Cisplatin  chemoradiation 13) 08/18/2020: end of radiation therapy.  14) 11/24/2020: PET CT scan shows a complete remission, no  FDG avid lesions or concern for residual/recurrent disease.   Interval History:  Patrick Nelson 73 y.o. male with medical history significant for SCC of the base of the tongue who presents for a follow up visit. The patient's last visit was on 12/22/2023. In the interim since the last visit he has had no major changes in his health.  At today's exam, Patrick Nelson reports his energy levels have been very good.  He reports are about a 9 out of 10.  His appetite is also quite strong.  He reports occasionally his throat does get tight but it happens quite seldomly.  He reports that liquid and solids have not been an issue.  He reports he has not been having any issues with pain such as sore throat or dysphagia.  He reports that he is doing his best to try to build up his weight but has declined to 154 pounds, down from 165 pounds at the beginning of the year.  He reports that he is however eating well.  He does do a lot of exercise and golfs 18 holes at a time.  He reports that he does also follow-up routinely with Dr. Jesus and ENT and did have to have his throat stretched at 1 point.  He notes that he had a wonderful 73th [place] .  Otherwise he has been his baseline level of health and denies any fevers, chills, sweats, nausea, vomiting or diarrhea.  Full 10 point ROS is otherwise negative.  MEDICAL HISTORY:  Past Medical History:  Diagnosis Date   Hyperlipidemia    Paroxysmal atrial fibrillation [facility])     SURGICAL HISTORY: Past Surgical History:  Procedure Laterality Date   CARDIOVERSION N/A 04/11/2023   Procedure: CARDIOVERSION;  Surgeon: Alvan Ronal BRAVO, MD;  Location: [facility];  Service: Cardiovascular;  Laterality: N/A;   DIRECT LARYNGOSCOPY N/A 05/22/2020   Procedure: DIRECT LARYNGOSCOPY w/BIOPSY TONGUE BASE;  Surgeon: Jesus Oliphant, MD;  Location: [facility];  Service: ENT;  Laterality: N/A;   EXTRACORPOREAL SHOCK WAVE LITHOTRIPSY Left 05/16/2023    Procedure: EXTRACORPOREAL SHOCK WAVE LITHOTRIPSY (ESWL);  Surgeon: Elisabeth Valli BIRCH, MD;  Location: [facility];  Service: Urology;  Laterality: Left;   IR GASTROSTOMY TUBE MOD SED  06/26/2020   IR GASTROSTOMY TUBE REMOVAL  12/25/2020   IR IMAGING GUIDED PORT INSERTION  06/26/2020   IR REMOVAL TUN ACCESS W/ PORT W/O FL MOD SED  12/25/2020   TOTAL HIP ARTHROPLASTY  2012   right hip-Dr. Yvone    SOCIAL HISTORY: Social History   Socioeconomic History   Marital status: Widowed    Spouse name: Not on file   Number of children: 1   Years of education: Not on file   Highest education level: Not on file  Occupational History   Not on file  Tobacco Use   Smoking status: Never   Smokeless tobacco: Never  Vaping Use   Vaping status: Never Used  Substance and Sexual Activity   Alcohol use: Yes   Drug use: No   Sexual activity: Yes  Other Topics Concern   Not on file  Social History Narrative   Not on file   Social Drivers of Health   Financial Resource Strain: Low Risk  (07/20/2020)   Overall Financial Resource Strain (CARDIA)    Difficulty of Paying Living Expenses: Not hard at all  Food Insecurity: Low Risk  (12/12/2022)   Received from [facility]   Hunger Vital Sign    Within the past 12 months, you worried that your food would run out before you got money to buy more: Never true    Within the past 12 months, the food you bought just didn't last and you didn't have money to get more. : Never true  Transportation Needs: No Transportation Needs (12/12/2022)   Received from [facility]    In the past 12 months, has lack of reliable transportation kept you from medical appointments, meetings, work or from getting things needed for daily living? : No  Physical Activity: Not on file  Stress: Not on file  Social Connections: Moderately Isolated (07/20/2020)   Social Connection and Isolation Panel    Frequency of Communication with Friends and  Family: More than three times a week    Frequency of Social Gatherings with Friends and Family: More than three times a week    Attends Religious Services: Never    Database administrator or Organizations: No    Attends Engineer, structural: Not on file    Marital Status: Married  Catering manager Violence: Not on file    FAMILY HISTORY: Family History  Problem Relation Age of Onset   Hypertension Mother    Heart disease Mother    Heart disease Brother     ALLERGIES:  has no known allergies.  MEDICATIONS:  Current Outpatient Medications  Medication Sig Dispense Refill   amLODipine -benazepril  (LOTREL) 5-10 MG capsule Take 1 capsule by mouth daily. 90 capsule 3   amoxicillin  (AMOXIL ) 500 MG capsule Take four capsules one hour before dental appointment. 4 capsule 3   atorvastatin  (LIPITOR) 40 MG tablet Take 1 tablet (40 mg total) by mouth in the morning. 90 tablet 3   Cholecalciferol (VITAMIN  D3 SUPER STRENGTH) 50 MCG (2000 UT) TABS Take 2,000 Units by mouth daily in the afternoon.     Chromium Picolinate (CHROMIUM PICOLATE PO) Take 1 tablet by mouth daily in the afternoon.     Coenzyme Q10 (COQ10) 100 MG CAPS Take 100 mg by mouth daily in the afternoon.     cyanocobalamin 1000 MCG tablet Take 1,000 mcg by mouth daily in the afternoon.     ELDERBERRY-VITAMIN C-ZINC PO Take 1 capsule by mouth daily in the afternoon.     ELIQUIS  5 MG TABS tablet TAKE 1 TABLET BY MOUTH TWICE A DAY 60 tablet 5   Magnesium  Oxide -Mg Supplement (MAG-200) 200 MG TABS Take 200 mg by mouth daily in the afternoon.     Multiple Vitamins-Minerals (IMMUNE SUPPORT PO) Take 1 capsule by mouth daily in the afternoon. Kyolic Aged Garlic Extract Immune Formula 103     SODIUM FLUORIDE  5000 PPM 1.1 % PSTE Place 1 Application onto teeth at bedtime.     tamsulosin  (FLOMAX ) 0.4 MG CAPS capsule Take 1 capsule (0.4 mg total) by mouth at bedtime. 30 capsule 0   No current facility-administered medications for this  visit.    REVIEW OF SYSTEMS:   Constitutional: ( - ) fevers, ( - )  chills , ( - ) night sweats Eyes: ( - ) blurriness of vision, ( - ) double vision, ( - ) watery eyes Ears, nose, mouth, throat, and face: ( - ) mucositis, ( - ) sore throat Respiratory: ( - ) cough, ( - ) dyspnea, ( - ) wheezes Cardiovascular: ( - ) palpitation, ( - ) chest discomfort, ( - ) lower extremity swelling Gastrointestinal:  ( - ) nausea, ( - ) heartburn, ( - ) change in bowel habits Skin: ( - ) abnormal skin rashes Lymphatics: ( - ) new lymphadenopathy, ( - ) easy bruising Neurological: ( - ) numbness, ( - ) tingling, ( - ) new weaknesses Behavioral/Psych: ( - ) mood change, ( - ) new changes  All other systems were reviewed with the patient and are negative.  PHYSICAL EXAMINATION: ECOG PERFORMANCE STATUS: 0 - Asymptomatic  Vitals:   06/02/24 1127  BP: 125/62  Pulse: 64  Resp: 13  Temp: 97.6 F (36.4 C)  SpO2: 100%      Filed Weights   06/02/24 1127  Weight: 154 lb 9.6 oz (70.1 kg)       GENERAL: well appearing elderly Caucasian male alert, no distress and comfortable SKIN:  Normal tugor, no more erythema around neck. No skin lesions noted. EYES: conjunctiva are pink and non-injected, sclera clear OROPHARYNX: No evidence of ulcer/infection.  LUNGS: clear to auscultation and percussion with normal breathing effort HEART: regular rate & rhythm and no murmurs and no lower extremity edema Musculoskeletal: no cyanosis of digits and no clubbing  PSYCH: alert & oriented x 3, fluent speech NEURO: no focal motor/sensory deficits  LABORATORY DATA:  I have reviewed the data as listed    Latest Ref Rng & Units 06/02/2024   10:43 AM 12/22/2023   11:03 AM 05/29/2023   12:10 PM  CBC  WBC 4.0 - 10.5 K/uL 4.7  4.2  4.7   Hemoglobin 13.0 - 17.0 g/dL 84.6  85.3  85.1   Hematocrit 39.0 - 52.0 % 45.6  43.0  43.8   Platelets 150 - 400 K/uL 221  185  192        Latest Ref Rng & Units 06/02/2024   10:43  AM 12/22/2023   11:03 AM 11/04/2023    9:45 AM  CMP  Glucose 70 - 99 mg/dL 86  96    BUN 8 - 23 mg/dL 20  22    Creatinine 9.38 - 1.24 mg/dL 9.08  9.16    Sodium 864 - 145 mmol/L 138  137    Potassium 3.5 - 5.1 mmol/L 4.6  4.3    Chloride 98 - 111 mmol/L 105  104    CO2 22 - 32 mmol/L 28  28    Calcium  8.9 - 10.3 mg/dL 9.8  9.3    Total Protein 6.5 - 8.1 g/dL 7.0  6.2  5.9   Total Bilirubin 0.0 - 1.2 mg/dL 0.9  0.7  0.5   Alkaline Phos 38 - 126 U/L 85  78  90   AST 15 - 41 U/L 26  22  19    ALT 0 - 44 U/L 22  17  17      RADIOGRAPHIC STUDIES: No results found.   ASSESSMENT & PLAN Patrick Nelson 73 y.o. male with medical history significant for SCC of the base of the tongue who presents for a follow up visit.  Review the labs, review the records, discussion with the patient the findings are most consistent with a squamous cell carcinoma of the back base of the tongue.  This was a T1 N1 M0 p16 positive cancer which makes it stage I.  Overall the prognosis for this is quite well and there is an excellent chance of durable remission after chemoradiation therapy with cisplatin .   Previously we discussed treatment moving forward and the expected side effects and symptoms.  Side effects include but are not limited to fatigue, anemia, kidney dysfunction, nausea, vomiting, diarrhea, throat pain, weight loss, and neuropathy.  Patient his wife were agreeable to proceeding with treatment.  We also discussed the medications to help with potential side effects including Zofran  as needed, Compazine  as needed, and EMLA  cream.    The treatment of choice for this patient will be radiation with concurrent cisplatin  40 mg per metered squared weekly x7 doses.  We started this on 07/03/2020 and had weekly visits with the patient while he is receiving this treatment. He completed chemotherapy on 08/14/2020.   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 P16+, Stage I  --patient completed 7  weekly doses of cisplatin  40mg /m2 administered with radiation on 08/18/2020 --PET CT scan on 11/24/2020 shows that the patient is in a complete remission. Proceed with fibro-optic exams with ENT/Rad Onc moving forward.  --Last seen by Dr. Jesus, ENT, on 08/18/2023 with no evidence of recurrent disease. Planning for RTC in 12 months time --Last seen by Dr. Izell, RadOnc, on 12/04/2021. Plan to return PRN.  --Labs today were reviewed without any intervention needed.  Labs show white blood cell count 4.7, hemoglobin 15.3, MCV 93.3, platelets 221 --RTC in 12 months.  No further visits required at that time as patient would be in survivorship.  #Supportive Care --PEG and Port removed --erythematous radiation rash resolved with topical treatments.   No orders of the defined types were placed in this encounter.  All questions were answered. The patient knows to call the clinic with any problems, questions or concerns.  I have spent a total of 25 minutes minutes of face-to-face and non-face-to-face time, preparing to see the patient, performing a medically appropriate examination, counseling and educating the patient, ordering tests, documenting clinical information in the electronic health record,and  care coordination.   Norleen IVAR Kidney, MD Department of Hematology/Oncology [facility] Phone: [phone] Pager: [phone] Email: [email or web address]  06/06/2024 4:12 PM

## 2024-06-03 LAB — T4: T4, Total: 6.3 ug/dL (ref 4.5–12.0)

## 2024-06-22 ENCOUNTER — Other Ambulatory Visit: Payer: Self-pay | Admitting: Cardiovascular Disease

## 2024-06-22 DIAGNOSIS — L821 Other seborrheic keratosis: Secondary | ICD-10-CM | POA: Diagnosis not present

## 2024-06-22 DIAGNOSIS — I480 Paroxysmal atrial fibrillation: Secondary | ICD-10-CM

## 2024-06-22 DIAGNOSIS — D225 Melanocytic nevi of trunk: Secondary | ICD-10-CM | POA: Diagnosis not present

## 2024-06-22 DIAGNOSIS — L814 Other melanin hyperpigmentation: Secondary | ICD-10-CM | POA: Diagnosis not present

## 2024-06-22 DIAGNOSIS — L570 Actinic keratosis: Secondary | ICD-10-CM | POA: Diagnosis not present

## 2024-06-22 DIAGNOSIS — Z85828 Personal history of other malignant neoplasm of skin: Secondary | ICD-10-CM | POA: Diagnosis not present

## 2024-06-22 DIAGNOSIS — L578 Other skin changes due to chronic exposure to nonionizing radiation: Secondary | ICD-10-CM | POA: Diagnosis not present

## 2024-06-22 NOTE — Telephone Encounter (Signed)
 Prescription refill request for Eliquis  received. Indication:afib Last office visit:1/25 Scr:0.91  9/25 Age: 73 Weight:70.1  kg  Prescription refilled

## 2024-06-29 DIAGNOSIS — E7800 Pure hypercholesterolemia, unspecified: Secondary | ICD-10-CM | POA: Diagnosis not present

## 2024-06-29 DIAGNOSIS — I10 Essential (primary) hypertension: Secondary | ICD-10-CM | POA: Diagnosis not present

## 2024-07-27 DIAGNOSIS — N200 Calculus of kidney: Secondary | ICD-10-CM | POA: Diagnosis not present

## 2024-08-15 ENCOUNTER — Other Ambulatory Visit: Payer: Self-pay | Admitting: Cardiovascular Disease

## 2024-10-09 ENCOUNTER — Other Ambulatory Visit: Payer: Self-pay | Admitting: Cardiovascular Disease

## 2024-10-28 ENCOUNTER — Ambulatory Visit (HOSPITAL_COMMUNITY)
Admission: RE | Admit: 2024-10-28 | Discharge: 2024-10-28 | Disposition: A | Discharge status: Home / Self Care | Source: Ambulatory Visit | Attending: Cardiovascular Disease | Admitting: Cardiovascular Disease

## 2024-10-28 ENCOUNTER — Ambulatory Visit: Payer: Self-pay | Admitting: Cardiovascular Disease

## 2024-10-28 DIAGNOSIS — I7121 Aneurysm of the ascending aorta, without rupture: Secondary | ICD-10-CM

## 2024-10-28 LAB — ECHOCARDIOGRAM COMPLETE
Area-P 1/2: 2.54 cm2
S' Lateral: 2.7 cm

## 2024-12-22 ENCOUNTER — Other Ambulatory Visit

## 2024-12-22 ENCOUNTER — Ambulatory Visit: Admitting: Hematology and Oncology

## 2025-06-13 ENCOUNTER — Ambulatory Visit: Admitting: Hematology and Oncology

## 2025-06-13 ENCOUNTER — Other Ambulatory Visit
